# Patient Record
Sex: Female | Born: 1935 | Race: Black or African American | Hispanic: No | Smoking: Current some day smoker
Health system: Southern US, Community
[De-identification: ages and names within clinical notes are randomized; demographics above are authoritative.]

## PROBLEM LIST (undated history)

## (undated) DIAGNOSIS — M797 Fibromyalgia: Secondary | ICD-10-CM

## (undated) DIAGNOSIS — K219 Gastro-esophageal reflux disease without esophagitis: Secondary | ICD-10-CM

## (undated) DIAGNOSIS — M4712 Other spondylosis with myelopathy, cervical region: Secondary | ICD-10-CM

## (undated) DIAGNOSIS — R9439 Abnormal result of other cardiovascular function study: Secondary | ICD-10-CM

## (undated) DIAGNOSIS — J45909 Unspecified asthma, uncomplicated: Secondary | ICD-10-CM

## (undated) DIAGNOSIS — E7800 Pure hypercholesterolemia, unspecified: Secondary | ICD-10-CM

## (undated) DIAGNOSIS — E119 Type 2 diabetes mellitus without complications: Secondary | ICD-10-CM

## (undated) DIAGNOSIS — J42 Unspecified chronic bronchitis: Secondary | ICD-10-CM

## (undated) DIAGNOSIS — I10 Essential (primary) hypertension: Secondary | ICD-10-CM

## (undated) DIAGNOSIS — T8859XA Other complications of anesthesia, initial encounter: Secondary | ICD-10-CM

## (undated) DIAGNOSIS — T4145XA Adverse effect of unspecified anesthetic, initial encounter: Secondary | ICD-10-CM

## (undated) DIAGNOSIS — R51 Headache: Secondary | ICD-10-CM

## (undated) DIAGNOSIS — E785 Hyperlipidemia, unspecified: Secondary | ICD-10-CM

## (undated) DIAGNOSIS — Z9289 Personal history of other medical treatment: Secondary | ICD-10-CM

## (undated) DIAGNOSIS — I6529 Occlusion and stenosis of unspecified carotid artery: Secondary | ICD-10-CM

## (undated) DIAGNOSIS — K589 Irritable bowel syndrome without diarrhea: Secondary | ICD-10-CM

## (undated) DIAGNOSIS — G4730 Sleep apnea, unspecified: Secondary | ICD-10-CM

## (undated) DIAGNOSIS — M1990 Unspecified osteoarthritis, unspecified site: Secondary | ICD-10-CM

## (undated) DIAGNOSIS — E039 Hypothyroidism, unspecified: Secondary | ICD-10-CM

## (undated) DIAGNOSIS — N200 Calculus of kidney: Secondary | ICD-10-CM

## (undated) DIAGNOSIS — K649 Unspecified hemorrhoids: Secondary | ICD-10-CM

## (undated) DIAGNOSIS — E739 Lactose intolerance, unspecified: Secondary | ICD-10-CM

## (undated) HISTORY — PX: CARDIAC CATHETERIZATION: SHX172

## (undated) HISTORY — DX: Abnormal result of other cardiovascular function study: R94.39

## (undated) HISTORY — PX: BREAST BIOPSY: SHX20

## (undated) HISTORY — PX: ELBOW SURGERY: SHX618

## (undated) HISTORY — PX: CYSTOSCOPY W/ URETERAL STENT PLACEMENT: SHX1429

## (undated) HISTORY — DX: Occlusion and stenosis of unspecified carotid artery: I65.29

## (undated) HISTORY — DX: Hyperlipidemia, unspecified: E78.5

## (undated) HISTORY — PX: KNEE ARTHROSCOPY: SHX127

## (undated) HISTORY — PX: SHOULDER ARTHROSCOPY: SHX128

## (undated) HISTORY — PX: MASTECTOMY COMPLETE / SIMPLE: SUR845

## (undated) HISTORY — PX: CYSTOSCOPY W/ URETERAL STENT REMOVAL: SHX1430

## (undated) HISTORY — DX: Other spondylosis with myelopathy, cervical region: M47.12

## (undated) HISTORY — DX: Unspecified hemorrhoids: K64.9

## (undated) HISTORY — DX: Irritable bowel syndrome, unspecified: K58.9

## (undated) HISTORY — DX: Personal history of other medical treatment: Z92.89

## (undated) HISTORY — PX: ABDOMINAL HYSTERECTOMY: SHX81

## (undated) SURGERY — ARTHROPLASTY, KNEE, TOTAL
Anesthesia: General | Laterality: Left

---
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
## 1997-09-15 ENCOUNTER — Emergency Department (HOSPITAL_COMMUNITY): Admission: EM | Admit: 1997-09-15 | Discharge: 1997-09-15 | Payer: Self-pay | Admitting: Emergency Medicine

## 1998-04-14 ENCOUNTER — Encounter: Payer: Self-pay | Admitting: Urology

## 1998-04-14 ENCOUNTER — Ambulatory Visit (HOSPITAL_COMMUNITY): Admission: RE | Admit: 1998-04-14 | Discharge: 1998-04-14 | Payer: Self-pay | Admitting: Urology

## 1998-05-11 ENCOUNTER — Ambulatory Visit (HOSPITAL_COMMUNITY): Admission: RE | Admit: 1998-05-11 | Discharge: 1998-05-11 | Payer: Self-pay | Admitting: Internal Medicine

## 1998-05-12 ENCOUNTER — Encounter: Payer: Self-pay | Admitting: Internal Medicine

## 1998-06-19 ENCOUNTER — Emergency Department (HOSPITAL_COMMUNITY): Admission: EM | Admit: 1998-06-19 | Discharge: 1998-06-19 | Payer: Self-pay | Admitting: Emergency Medicine

## 1998-06-19 ENCOUNTER — Encounter: Payer: Self-pay | Admitting: Emergency Medicine

## 1998-11-24 ENCOUNTER — Encounter: Payer: Self-pay | Admitting: Emergency Medicine

## 1998-11-24 ENCOUNTER — Emergency Department (HOSPITAL_COMMUNITY): Admission: EM | Admit: 1998-11-24 | Discharge: 1998-11-24 | Payer: Self-pay | Admitting: Emergency Medicine

## 1998-12-16 ENCOUNTER — Emergency Department (HOSPITAL_COMMUNITY): Admission: EM | Admit: 1998-12-16 | Discharge: 1998-12-16 | Payer: Self-pay

## 1998-12-18 ENCOUNTER — Inpatient Hospital Stay (HOSPITAL_COMMUNITY): Admission: EM | Admit: 1998-12-18 | Discharge: 1998-12-21 | Payer: Self-pay | Admitting: Internal Medicine

## 1999-09-11 ENCOUNTER — Emergency Department (HOSPITAL_COMMUNITY): Admission: EM | Admit: 1999-09-11 | Discharge: 1999-09-11 | Payer: Self-pay | Admitting: Emergency Medicine

## 2000-02-07 ENCOUNTER — Emergency Department (HOSPITAL_COMMUNITY): Admission: EM | Admit: 2000-02-07 | Discharge: 2000-02-07 | Payer: Self-pay | Admitting: Emergency Medicine

## 2000-02-07 ENCOUNTER — Encounter: Payer: Self-pay | Admitting: Emergency Medicine

## 2000-02-14 ENCOUNTER — Emergency Department (HOSPITAL_COMMUNITY): Admission: EM | Admit: 2000-02-14 | Discharge: 2000-02-14 | Payer: Self-pay

## 2000-02-14 ENCOUNTER — Encounter: Payer: Self-pay | Admitting: Emergency Medicine

## 2000-04-29 ENCOUNTER — Encounter: Payer: Self-pay | Admitting: Orthopedic Surgery

## 2000-04-29 ENCOUNTER — Ambulatory Visit (HOSPITAL_COMMUNITY): Admission: RE | Admit: 2000-04-29 | Discharge: 2000-04-29 | Payer: Self-pay | Admitting: Orthopedic Surgery

## 2000-12-01 ENCOUNTER — Ambulatory Visit (HOSPITAL_COMMUNITY): Admission: RE | Admit: 2000-12-01 | Discharge: 2000-12-01 | Payer: Self-pay | Admitting: Cardiology

## 2000-12-01 ENCOUNTER — Encounter: Payer: Self-pay | Admitting: Cardiology

## 2000-12-11 ENCOUNTER — Ambulatory Visit (HOSPITAL_COMMUNITY): Admission: RE | Admit: 2000-12-11 | Discharge: 2000-12-11 | Payer: Self-pay | Admitting: Cardiology

## 2000-12-27 ENCOUNTER — Emergency Department (HOSPITAL_COMMUNITY): Admission: EM | Admit: 2000-12-27 | Discharge: 2000-12-28 | Payer: Self-pay | Admitting: Emergency Medicine

## 2000-12-28 ENCOUNTER — Encounter: Payer: Self-pay | Admitting: Emergency Medicine

## 2001-01-02 ENCOUNTER — Encounter: Payer: Self-pay | Admitting: Urology

## 2001-01-02 ENCOUNTER — Encounter: Admission: RE | Admit: 2001-01-02 | Discharge: 2001-01-02 | Payer: Self-pay | Admitting: Urology

## 2001-01-04 ENCOUNTER — Emergency Department (HOSPITAL_COMMUNITY): Admission: EM | Admit: 2001-01-04 | Discharge: 2001-01-04 | Payer: Self-pay | Admitting: Emergency Medicine

## 2001-01-07 ENCOUNTER — Encounter: Admission: RE | Admit: 2001-01-07 | Discharge: 2001-01-07 | Payer: Self-pay | Admitting: Urology

## 2001-01-07 ENCOUNTER — Encounter: Payer: Self-pay | Admitting: Urology

## 2001-08-03 ENCOUNTER — Encounter: Payer: Self-pay | Admitting: Cardiology

## 2001-08-03 ENCOUNTER — Inpatient Hospital Stay (HOSPITAL_COMMUNITY): Admission: EM | Admit: 2001-08-03 | Discharge: 2001-08-06 | Payer: Self-pay | Admitting: Emergency Medicine

## 2001-08-27 ENCOUNTER — Emergency Department (HOSPITAL_COMMUNITY): Admission: EM | Admit: 2001-08-27 | Discharge: 2001-08-27 | Payer: Self-pay | Admitting: Neurology

## 2001-08-27 ENCOUNTER — Encounter: Payer: Self-pay | Admitting: Emergency Medicine

## 2001-09-14 ENCOUNTER — Ambulatory Visit (HOSPITAL_COMMUNITY): Admission: RE | Admit: 2001-09-14 | Discharge: 2001-09-14 | Payer: Self-pay | Admitting: Orthopedic Surgery

## 2001-09-14 ENCOUNTER — Encounter: Payer: Self-pay | Admitting: Orthopedic Surgery

## 2001-11-03 ENCOUNTER — Encounter: Admission: RE | Admit: 2001-11-03 | Discharge: 2002-02-01 | Payer: Self-pay

## 2002-02-11 ENCOUNTER — Encounter: Admission: RE | Admit: 2002-02-11 | Discharge: 2002-03-02 | Payer: Self-pay

## 2002-03-11 ENCOUNTER — Encounter: Admission: RE | Admit: 2002-03-11 | Discharge: 2002-06-09 | Payer: Self-pay

## 2002-04-30 ENCOUNTER — Encounter: Admission: RE | Admit: 2002-04-30 | Discharge: 2002-04-30 | Payer: Self-pay | Admitting: Cardiology

## 2002-04-30 ENCOUNTER — Encounter: Payer: Self-pay | Admitting: Cardiology

## 2002-05-31 ENCOUNTER — Other Ambulatory Visit: Admission: RE | Admit: 2002-05-31 | Discharge: 2002-05-31 | Payer: Self-pay | Admitting: General Surgery

## 2002-07-07 ENCOUNTER — Ambulatory Visit (HOSPITAL_COMMUNITY): Admission: RE | Admit: 2002-07-07 | Discharge: 2002-07-07 | Payer: Self-pay | Admitting: General Surgery

## 2002-07-07 ENCOUNTER — Encounter (HOSPITAL_BASED_OUTPATIENT_CLINIC_OR_DEPARTMENT_OTHER): Payer: Self-pay | Admitting: General Surgery

## 2002-11-02 ENCOUNTER — Encounter: Payer: Self-pay | Admitting: Orthopedic Surgery

## 2002-11-02 ENCOUNTER — Ambulatory Visit (HOSPITAL_COMMUNITY): Admission: RE | Admit: 2002-11-02 | Discharge: 2002-11-02 | Payer: Self-pay | Admitting: Orthopedic Surgery

## 2002-12-07 ENCOUNTER — Encounter: Payer: Self-pay | Admitting: Orthopedic Surgery

## 2002-12-09 ENCOUNTER — Ambulatory Visit (HOSPITAL_COMMUNITY): Admission: RE | Admit: 2002-12-09 | Discharge: 2002-12-10 | Payer: Self-pay | Admitting: Orthopedic Surgery

## 2002-12-28 ENCOUNTER — Encounter: Admission: RE | Admit: 2002-12-28 | Discharge: 2003-03-28 | Payer: Self-pay | Admitting: Orthopedic Surgery

## 2003-04-12 ENCOUNTER — Encounter: Admission: RE | Admit: 2003-04-12 | Discharge: 2003-07-11 | Payer: Self-pay | Admitting: Orthopedic Surgery

## 2003-07-24 ENCOUNTER — Emergency Department (HOSPITAL_COMMUNITY): Admission: EM | Admit: 2003-07-24 | Discharge: 2003-07-25 | Payer: Self-pay

## 2004-08-29 ENCOUNTER — Encounter
Admission: RE | Admit: 2004-08-29 | Discharge: 2004-11-27 | Payer: Self-pay | Admitting: Physical Medicine & Rehabilitation

## 2004-09-05 ENCOUNTER — Ambulatory Visit (HOSPITAL_COMMUNITY): Admission: RE | Admit: 2004-09-05 | Discharge: 2004-09-05 | Payer: Self-pay | Admitting: Orthopedic Surgery

## 2005-01-02 ENCOUNTER — Encounter: Admission: RE | Admit: 2005-01-02 | Discharge: 2005-01-02 | Payer: Self-pay | Admitting: Neurosurgery

## 2005-01-23 ENCOUNTER — Encounter: Admission: RE | Admit: 2005-01-23 | Discharge: 2005-01-23 | Payer: Self-pay | Admitting: Neurosurgery

## 2005-07-27 ENCOUNTER — Emergency Department (HOSPITAL_COMMUNITY): Admission: EM | Admit: 2005-07-27 | Discharge: 2005-07-27 | Payer: Self-pay | Admitting: Emergency Medicine

## 2005-09-11 ENCOUNTER — Ambulatory Visit: Payer: Self-pay | Admitting: Internal Medicine

## 2006-02-25 ENCOUNTER — Encounter: Admission: RE | Admit: 2006-02-25 | Discharge: 2006-02-25 | Payer: Self-pay | Admitting: Orthopedic Surgery

## 2006-12-23 ENCOUNTER — Ambulatory Visit (HOSPITAL_COMMUNITY): Admission: RE | Admit: 2006-12-23 | Discharge: 2006-12-23 | Payer: Self-pay | Admitting: Orthopedic Surgery

## 2007-03-28 ENCOUNTER — Encounter: Admission: RE | Admit: 2007-03-28 | Discharge: 2007-03-28 | Payer: Self-pay | Admitting: Orthopedic Surgery

## 2007-08-06 ENCOUNTER — Encounter: Admission: RE | Admit: 2007-08-06 | Discharge: 2007-09-10 | Payer: Self-pay | Admitting: Orthopedic Surgery

## 2007-10-29 ENCOUNTER — Encounter: Admission: RE | Admit: 2007-10-29 | Discharge: 2007-10-29 | Payer: Self-pay | Admitting: Orthopedic Surgery

## 2007-12-22 ENCOUNTER — Encounter: Admission: RE | Admit: 2007-12-22 | Discharge: 2007-12-22 | Payer: Self-pay | Admitting: Orthopedic Surgery

## 2007-12-25 ENCOUNTER — Encounter: Admission: RE | Admit: 2007-12-25 | Discharge: 2007-12-25 | Payer: Self-pay | Admitting: Orthopedic Surgery

## 2008-03-23 ENCOUNTER — Encounter: Admission: RE | Admit: 2008-03-23 | Discharge: 2008-03-23 | Payer: Self-pay | Admitting: Orthopedic Surgery

## 2009-01-19 ENCOUNTER — Ambulatory Visit (HOSPITAL_COMMUNITY): Admission: RE | Admit: 2009-01-19 | Discharge: 2009-01-20 | Payer: Self-pay | Admitting: Orthopedic Surgery

## 2009-04-01 ENCOUNTER — Encounter: Admission: RE | Admit: 2009-04-01 | Discharge: 2009-04-01 | Payer: Self-pay | Admitting: Orthopedic Surgery

## 2009-04-04 ENCOUNTER — Encounter: Admission: RE | Admit: 2009-04-04 | Discharge: 2009-05-17 | Payer: Self-pay | Admitting: Orthopedic Surgery

## 2009-05-15 ENCOUNTER — Ambulatory Visit (HOSPITAL_COMMUNITY): Admission: RE | Admit: 2009-05-15 | Discharge: 2009-05-15 | Payer: Self-pay | Admitting: Orthopedic Surgery

## 2009-10-19 ENCOUNTER — Encounter: Admission: RE | Admit: 2009-10-19 | Discharge: 2009-10-19 | Payer: Self-pay | Admitting: Neurosurgery

## 2009-11-04 ENCOUNTER — Emergency Department (HOSPITAL_COMMUNITY): Admission: EM | Admit: 2009-11-04 | Discharge: 2009-11-04 | Payer: Self-pay | Admitting: Emergency Medicine

## 2010-01-17 ENCOUNTER — Encounter: Admission: RE | Admit: 2010-01-17 | Discharge: 2010-01-17 | Payer: Self-pay | Admitting: Orthopedic Surgery

## 2010-06-24 ENCOUNTER — Encounter: Payer: Self-pay | Admitting: Family Medicine

## 2010-08-20 LAB — BASIC METABOLIC PANEL
BUN: 23 mg/dL (ref 6–23)
Chloride: 104 mEq/L (ref 96–112)
Creatinine, Ser: 1.02 mg/dL (ref 0.4–1.2)
Glucose, Bld: 113 mg/dL — ABNORMAL HIGH (ref 70–99)
Potassium: 4 mEq/L (ref 3.5–5.1)

## 2010-08-20 LAB — CBC
HCT: 38 % (ref 36.0–46.0)
MCV: 96.2 fL (ref 78.0–100.0)
Platelets: 244 10*3/uL (ref 150–400)
RDW: 13 % (ref 11.5–15.5)
WBC: 6.9 10*3/uL (ref 4.0–10.5)

## 2010-08-20 LAB — URINE CULTURE: Colony Count: NO GROWTH

## 2010-08-20 LAB — URINALYSIS, ROUTINE W REFLEX MICROSCOPIC
Ketones, ur: NEGATIVE mg/dL
Nitrite: NEGATIVE
Protein, ur: NEGATIVE mg/dL
Urobilinogen, UA: 0.2 mg/dL (ref 0.0–1.0)

## 2010-08-20 LAB — DIFFERENTIAL
Basophils Absolute: 0.1 10*3/uL (ref 0.0–0.1)
Basophils Relative: 1 % (ref 0–1)
Eosinophils Absolute: 0.3 10*3/uL (ref 0.0–0.7)
Eosinophils Relative: 4 % (ref 0–5)
Neutrophils Relative %: 40 % — ABNORMAL LOW (ref 43–77)

## 2010-08-20 LAB — URINE MICROSCOPIC-ADD ON

## 2010-09-04 LAB — CBC
HCT: 37.7 % (ref 36.0–46.0)
MCHC: 33.7 g/dL (ref 30.0–36.0)
MCV: 95.1 fL (ref 78.0–100.0)
Platelets: 239 10*3/uL (ref 150–400)
RDW: 13.1 % (ref 11.5–15.5)

## 2010-09-04 LAB — URINALYSIS, ROUTINE W REFLEX MICROSCOPIC
Glucose, UA: NEGATIVE mg/dL
Hgb urine dipstick: NEGATIVE
Specific Gravity, Urine: 1.024 (ref 1.005–1.030)
Urobilinogen, UA: 0.2 mg/dL (ref 0.0–1.0)
pH: 5.5 (ref 5.0–8.0)

## 2010-09-04 LAB — COMPREHENSIVE METABOLIC PANEL
AST: 19 U/L (ref 0–37)
Albumin: 4 g/dL (ref 3.5–5.2)
BUN: 18 mg/dL (ref 6–23)
Calcium: 9.4 mg/dL (ref 8.4–10.5)
Creatinine, Ser: 0.98 mg/dL (ref 0.4–1.2)
GFR calc Af Amer: >60 mL/min (ref >60)
Total Bilirubin: 0.5 mg/dL (ref 0.3–1.2)
Total Protein: 6.7 g/dL (ref 6.0–8.3)

## 2010-09-04 LAB — GLUCOSE, CAPILLARY
Glucose-Capillary: 112 mg/dL — ABNORMAL HIGH (ref 70–99)
Glucose-Capillary: 120 mg/dL — ABNORMAL HIGH (ref 70–99)

## 2010-09-08 LAB — GLUCOSE, CAPILLARY
Glucose-Capillary: 108 mg/dL — ABNORMAL HIGH (ref 70–99)
Glucose-Capillary: 140 mg/dL — ABNORMAL HIGH (ref 70–99)

## 2010-09-08 LAB — COMPREHENSIVE METABOLIC PANEL
AST: 22 U/L (ref 0–37)
Albumin: 4.3 g/dL (ref 3.5–5.2)
Calcium: 9.9 mg/dL (ref 8.4–10.5)
Chloride: 102 mEq/L (ref 96–112)
Creatinine, Ser: 1.23 mg/dL — ABNORMAL HIGH (ref 0.4–1.2)
GFR calc Af Amer: 52 mL/min — ABNORMAL LOW (ref >60)
Sodium: 141 mEq/L (ref 135–145)
Total Bilirubin: 0.8 mg/dL (ref 0.3–1.2)

## 2010-09-08 LAB — URINALYSIS, ROUTINE W REFLEX MICROSCOPIC
Hgb urine dipstick: NEGATIVE
Specific Gravity, Urine: 1.03 (ref 1.005–1.030)
pH: 5 (ref 5.0–8.0)

## 2010-09-08 LAB — CBC
MCV: 95.1 fL (ref 78.0–100.0)
Platelets: 265 10*3/uL (ref 150–400)
WBC: 6.8 10*3/uL (ref 4.0–10.5)

## 2010-10-01 ENCOUNTER — Ambulatory Visit
Admission: RE | Admit: 2010-10-01 | Discharge: 2010-10-01 | Disposition: A | Discharge status: Home / Self Care | Payer: Medicare Other | Source: Ambulatory Visit | Attending: Orthopedic Surgery | Admitting: Orthopedic Surgery

## 2010-10-01 ENCOUNTER — Other Ambulatory Visit: Payer: Self-pay | Admitting: Orthopedic Surgery

## 2010-10-01 DIAGNOSIS — R52 Pain, unspecified: Secondary | ICD-10-CM

## 2010-10-16 NOTE — Op Note (Signed)
NAME:  Jean Aguilar, Jean Aguilar NO.:  [id number]   MEDICAL RECORD NO.:  [id number]          PATIENT TYPE:  OIB   LOCATION:  5039                         FACILITY:  [facility]   PHYSICIAN:  Myrtie Neither, MD      DATE OF BIRTH:  Jun 05, 1935   DATE OF PROCEDURE:  01/19/2009  DATE OF DISCHARGE:                               OPERATIVE REPORT   PREOPERATIVE DIAGNOSIS:  Ulnar nerve entrapment, left elbow.   PREOPERATIVE DIAGNOSIS:  Ulnar nerve entrapment, left elbow.   ANESTHESIA:  General.   PROCEDURE:  Transposition of ulnar nerve, left elbow.   The patient was taken to the operating room.  After given adequate preop  medications, given general anesthesia and intubated, left upper  extremity was prepped with DuraPrep and draped in a sterile manner.  Tourniquet and Bovie were used for hemostasis.  Curvilinear incision was  made along the posteromedial aspect of the left elbow going through skin  and subcutaneous tissue down to the fascia.  Ulnar nerve was identified  at its olecranon groove, entrapped body of the transverse bands of  fibrotic scarring tissue.  The ulnar nerve was released both distally  and proximally after adequate dissection and freeing up of the nerve.  The nerve was then transferred anteriorly within the subcutaneous and  fascial tissue.  Copious irrigation was done.  Nerve itself was found to  be intact and was found to be stenotic at its passage to the groove.  Wound closure was then done with 2-0 Vicryl for the subcutaneous and  skin staples for the skin.  Compressive dressing was applied and sling  was applied.  The patient tolerated the procedure quite well and went to  the recovery room in stable and satisfactory condition.  The patient is  being kept for 23-hour observation for pain control and will be  discharged home on Percocet 1-2 q.4 p.r.n. for pain, use of sling, ice  packs, and to return to the office in 1 week.  The patient will be  discharged  in stable and satisfactory condition.      Myrtie Neither, MD  Electronically Signed     AC/MEDQ  D:  01/19/2009  T:  01/19/2009  Job:  [id number]

## 2010-10-19 NOTE — Consult Note (Signed)
[facility]  Patient:    Jean Aguilar, Jean Aguilar Visit Number: [id number] MRN: [id number]          Service Type: PMG Location: [facility] Attending Physician:  Jean Aguilar Dictated by:   Jean Aguilar, D.O. Proc. Date: 11/20/01 Admit Date:  11/03/2001   CC:         Jean Aguilar, M.D.   Consultation Report  [name] returns to clinic today as scheduled for a lumbar epidural steroid injection for chronic low back pain with degenerative disk disease of the lumbar spine including L5-S1 disk herniation to the left with left lower extremity radicular symptoms.  The patient was initially seen on November 17, 2001.  In the interim she has had no change in her pain or neurologic status. Her pain today is a 10/10 on a subjective scale.  Function and quality of life indexes remain declined.  Sleep remains poor.  She continues on Celebrex and Lorcet as needed.  I review health and history form and 14 point review of systems.  PHYSICAL EXAMINATION  VITAL SIGNS:  Blood pressure 143/53, pulse 86, respirations 10, O2 saturation 97% on room air.  IMPRESSION:  Chronic low back pain with degenerative disk disease of the lumbar spine, L5-S1 disk herniation with left lower extremity radicular symptoms.  PLAN: 1. Lumbar epidural steroid injection. 2. Continue current medications. 3. The patient is to return to clinic in two weeks for reevaluation and    possible repeat lumbar epidural steroid injection as predicated upon    patients response and symptoms.  PROCEDURE:  Lumbar epidural steroid injection.  Risks, benefits, limitations, and alternatives were described.  Procedure was described in detail.  The patient wishes to proceed.  Informed consent was obtained.  The patient was brought back to the fluoroscopy suite and placed on the table in prone position.  Skin was prepped and draped in the usual sterile fashion.  Skin and subcutaneous tissues were anesthetized  with 3 cc of preservative-free 1% lidocaine.  Under direct fluoroscopic guidance an 18-gauge 3.5 inch Tuohy needle was advanced into the left paramedian L5-S1 epidural space with loss of resistance technique.  No CSF, heme, or paraesthesias were noted.  This was then injected with 1.5 cc of Kenalog 40 mg/cc plus 1.5 cc of preservative-free normal saline with needle flush.  There were no complications.  The patient tolerated the procedure well.  Discharge instructions given.  The patient was educated on the above findings and recommendations and understands.  No barriers to communication. Dictated by:   Jean Aguilar, D.O. Attending Physician:  Jean Aguilar DD:  11/20/01 TD:  11/22/01 Job: [id number]

## 2010-10-19 NOTE — Op Note (Signed)
   NAME:  Jean Aguilar, Jean Aguilar                      ACCOUNT NO.:  [id number]   MEDICAL RECORD NO.:  [id number]                   PATIENT TYPE:  OIB   LOCATION:  5012                                 FACILITY:  [facility]   PHYSICIAN:  Myrtie Neither, M.D.                 DATE OF BIRTH:  May 03, 1936   DATE OF PROCEDURE:  12/09/2002  DATE OF DISCHARGE:  12/10/2002                                 OPERATIVE REPORT   PREOPERATIVE DIAGNOSIS:  Impingement syndrome and subacromial bursitis right  shoulder.   POSTOPERATIVE DIAGNOSIS:  Impingement syndrome and subacromial bursitis  right shoulder.   ANESTHESIA:  General.   PROCEDURE:  Arthroscopic acromioplasty, synovectomy, and decompression right  shoulder.   DESCRIPTION OF PROCEDURE:  The patient was taken to the operating room after  given adequate preoperative medications and given general anesthesia and  intubated.  The patient was also given scalene block to the right shoulder.  The patient was placed in barber chair position, right shoulder prepped and  draped in sterile manner.  __________ puncture wound made posterior of the  shoulder.  A ___________ rod was placed from posterior to anterior.  Anterior inflow of water.  Entrance was then made.  Placement of an  arthroscope was posterior, inflow of water was anterior.  Inspection of the  joint revealed tremendously hypertrophic subacromial bursal thickening with  chondromalacia changes of the subacromial articular surface.  Rotator cuff  was intact.  With a synovial shaver placed laterally, complete synovectomy  was done, after which the scope was placed laterally and the shaver placed  posteriorly and further synovectomy was done and acromioplasty was then done  with the bur and decompression of the coracoacromial ligament was then done  and copious and abundant irrigation was done.  After adequate decompression,  wound closure was then done with 2-0 nylon, 10 mL of 0.25% Marcaine with  epinephrine was injected to the joint.  A compressive dressing was applied.  A shoulder immobilizer applied.  The patient tolerated the procedure well  and went to the recovery room in stable and satisfactory condition.  The  patient is to be kept 23 observation for pain control.  The patient will be  discharged in the a.m. on Percocet one q.4h. p.r.n. for pain.  Continue  shoulder immobilizer, ice packs, and return to the office in one week.  The  patient will be discharged in stable and satisfactory condition.                                               Myrtie Neither, M.D.    AC/MEDQ  D:  12/09/2002  T:  12/09/2002  Job:  [id number]

## 2010-10-19 NOTE — Consult Note (Signed)
[facility]  Patient:    FRANSHESCA, CHIPMAN Visit Number: [id number] MRN: [id number]          Service Type: PMG Location: [facility]PC Attending Physician:  Sondra Come Dictated by:   Sondra Come, D.O. Proc. Date: 12/09/01 Admit Date:  11/03/2001   CC:         Kennieth Rad, M.D.   Consultation Report  HISTORY OF PRESENT ILLNESS:  Ms. Runnion returns to clinic today as scheduled for reevaluation and possible repeat lumbar epidural steroid injection for degenerative disk disease of the lumbar spine with a small L5-S1 disk herniation and left lower extremity radicular symptoms.  The patient states that she had no relief with the first lumbar epidural steroid injection on November 20, 2001.  Mainly her complaints today involve her left hip pain.  She states her low back is somewhat tolerable.  She rates her pain as a "severe" which corresponds with a 6/10 on a subjective scale.  Her hip pain is worse with walking, bending, and laying down as well as working.  She continues to take Celebrex but has run out and will go for a refill.  She also has run out of her hydrocodone and we discussed medications.  I reviewed the health and history form, and 14-point review of systems.  PHYSICAL EXAMINATION:  GENERAL:  The patient is an obese female in no acute distress.  VITAL SIGNS:  Blood pressure 121/63, pulse 93, respirations ______, O2 saturation is 97% on room air.  NEUROLOGIC:  Manual muscle testing is 5/5 bilateral lower extremities. Sensory exam is intact to light touch bilateral lower extremities.  Muscle stretch reflexes are symmetric bilateral lower extremities.  MUSCULOSKELETAL:  Palpatory examination reveals significant tenderness over the left greater trochanter reproducing the patients hip pain.  IMPRESSION: 1. Left trochanteric bursitis. 2. Degenerative disk disease of the lumbar spine at L5-S1 with small disk    herniation.  PLAN: 1. I had  a long discussion with Ms. Alspaugh regarding further treatment    options.  At this point, I think further lumbar epidural steroid injections    would be optional as she did not get any significant benefit from the first    injection although occasionally some patients will not see any response    until after the second injection and this was explained to the patient.    However, given the patients complaints of significant left hip pain and    reproduction of her hip pain on palpation of the trochanteric bursa, would    forego a repeat lumbar epidural steroid injection at this time in favor of    a left trochanteric bursal steroid injection.  This was discussed with the    patient at length.  I discussed risks, benefits, limitations, and    alternatives of the trochanteric bursal steroid injection.  The patient    wishes to proceed with this. 2. Continue Celebrex 200 mg one p.o. q.d. to b.i.d.  I have provided the    patient with 30 sample pills. 3. Continue Elavil. 4. Will give samples of Ultracet one to two up to three to four times per day    as needed for severe pain, #20 samples given. 5. The patient is to return to clinic in two weeks for reevaluation.  Would    consider repeating epidural injection at that time if no significant    improvement in the patients back pain.  PROCEDURE:  Left trochanteric bursal steroid injection.  The risks, benefits, limitations, and alternatives were described.  The patient wishes to proceed. Informed consent was obtained.  DESCRIPTION OF PROCEDURE:  The patient was placed on the table in lateral recumbent position.  The skin was prepped in normal fashion with Betadine and alcohol.  The left trochanteric bursa was injected with 1 cc of Kenalog 40 mg/cc plus 2 cc of 1% Lidocaine plus 2 cc of 0.25% Bupivacaine using a 25-gauge 1-1/2-inch needle.  The patient tolerated the procedure well. Discharge instructions given.  The patient instructed on  post-injection relative rest x48-72 hours.  The patient also instructed on the use of ice as needed for rebound pain.  The patient was educated on the above findings and recommendations, and understands.  There were no barriers to communication. Dictated by:   Sondra Come, D.O. Attending Physician:  Sondra Come DD:  12/09/01 TD:  12/13/01 Job: [id number]

## 2010-10-19 NOTE — Discharge Summary (Signed)
[facility]  Patient:    Jean Aguilar, Jean Aguilar Visit Number: [id number] MRN: [id number]          Service Type: EMS Location: ED Attending Physician:  Ilene Qua Dictated by:   Osvaldo Shipper. Spruill, M.D. Admit Date:  08/27/2001 Discharge Date: 08/27/2001                             Discharge Summary  ADMISSION DIAGNOSES: 1. Severe left lower quadrant abdominal pain. 2. History of fibromyalgia. 3. Possible diverticular disease. 4. Anxiety. 5. Hypertension. 6. Obesity. 7. Asthma. 8. Hypothyroidism. 9. Irritable bowel syndrome.  DISCHARGE DIAGNOSES: 1. Lumbar disk displacement. 2. Myalgia and myositis. 3. Obesity. 4. Irritable bowel syndrome. 5. Hypertension. 6. History of asthma.  HISTORY OF PRESENT ILLNESS AND REASON FOR ADMISSION:  This 75 year old black female with a history of previous back problems as well as fibromyalgia presented to the hospital complaining of severe left lower quadrant abdominal pain.  The patient has a previous history also of nephrolithiasis and irritable bowel syndrome, as well as hyperlipidemia.  She presented to the emergency room complaining of severe left lower quadrant pain and left costovertebral angle pain on the left prior to her presentation.  The patient states she was well before this acute episode.  She gave a past history of three bouts of kidney stones which required admission as well.  She denied any concurrent nausea, no vomiting, since her admission to the hospital, but did complain of nausea prior to her presentation.  She also complains of leaky urine and problems with her urination stopping and starting.  The emergency physician evaluated the patient and revealed mild hypokalemia, no leukocytosis, and a clear urine.  The patient was subsequently admitted for further evaluation due to her complaints of severe and intractable left lower quadrant and CVA discomfort.  It was initially  suspected that she may of had another kidney stone.  LABORATORY DATA:  White blood cell count on 08/02/01, 6900, hemoglobin 14.7. Serum sodium 139, potassium 3.3, chloride 107, CO2 content 26, glucose 106, creatinine 0.8.  Lipase was 15.  Liver profile revealed a cholesterol of 229, LDL cholesterol was 151.  Urinalysis was yellow-clear, and unremarkable.  The patient had 45,000 colonies of E. coli in her urine.  The patient had a lumbosacral spine MRI which revealed a borderline foraminal stenosis and a small left paracentral disk herniation.  Additional radiologic studies revealed degenerative joint changes in the left hip.  Abdominal x-ray revealed no acute abnormality which was obtained on 08/03/01.  CT scan of the abdomen and pelvis without contrast revealed a small periumbilical hernia containing only fat, no acute abnormality on the CT scan.  The patient was status post hysterectomy, but a CT scan of the pelvis was performed.  HOSPITAL COURSE:  The patient was initially admitted to an observation bed for evaluation of her severe back and left lower quadrant abdominal pain.  She was started on intravenous fluids of normal saline at 100 cc an hour, and also started on a full liquid diet.  She subsequently had to be given parenteral analgesics including Demerol, as well as some p.o. Darvocet to obtain any relief of her discomforts.  Her urine was strained for possible kidney stones, as this sounded to be the most likely etiology of her discomfort.  Her potassium was replaced by mouth. Given K-Dur 20 mEq p.o. b.i.d.  An urinalysis was obtained as well.  The  urine culture revealed some E. coli, but not enough for significant infection.  She was placed on Zelnorm, Celebrex, Synthroid, Prempro, Lipitor, Diovan, and Combivent on initial admission.  The patients Demerol and Phenergan had to be increase as she continued to have severe abdominal pain.  A consultation was sent to Dr. Su Grand  who evaluated the patient and did not feel as if the patient had renal stones. Another consultation was sent to gastroenterologist who evaluated the patient and did not feel she had acute GI abnormality.  Eventually, the patients pain came under some control, and it was felt that she could be released home with further evaluation as an outpatient by Dr. Francene Boyers.  The patient was released, and maintained on her current medications, but it did include a prescription for a narcotic analgesic at discharge.  CONDITION ON DISCHARGE:  Improved.  She was maintaining for pain control on Duragesic patch 75 mg every 3 days.  DISCHARGE MEDICATIONS: 1. Duragesic patch 75 mg every 3 days. 2. Flexeril prior to being released.  FOLLOWUP:  The patient appears stable, and will be seen by Dr. Myrtie Neither as an outpatient.Dictated by:   Osvaldo Shipper. Spruill, M.D. Attending Physician:  Ilene Qua DD:  09/09/01 TD:  09/10/01 Job: [id number]

## 2010-10-19 NOTE — Consult Note (Signed)
NAME:  Jean Aguilar, Jean Aguilar                        ACCOUNT NO.:  [id number]   MEDICAL RECORD NO.:  [id number]                   PATIENT TYPE:   LOCATION:                                       FACILITY:   PHYSICIAN:  Sondra Come, D.O.                 DATE OF BIRTH:   DATE OF CONSULTATION:  01/04/2002  DATE OF DISCHARGE:                  PHYSICAL MEDICINE & REHABILITATION CONSULTATION   HISTORY OF PRESENT ILLNESS:  The patient returns to clinic today for a  lumbar epidural steroid injection for degenerative disk disease of the  lumbar spine with small disk herniation at L5-S1 and disk bulges at L3-4 and  L4-5.  The patient continues to have low back pain with left lower extremity  radicular symptoms.  She has had one lumbar epidural steroid injection which  she states gave her some transient relief.  She wishes to have another.  I  reviewed health and history form and 14-point review of systems.  Pain today  is 8/10 on a subjective scale.   PHYSICAL EXAMINATION:  GENERAL:  Obese female in no acute distress.  VITAL SIGNS:  Blood pressure 158/87, pulse 84, respirations 14, O2  saturation 96% on room air.   IMPRESSION:  Degenerative disk disease of the lumbar spine with L5-S1 disk  herniation and left lower extremity radicular symptoms.   PLAN:  1. Repeat lumbar epidural steroid injection.  2. Continue current medications including Norco 7.5 mg/325 mg 1 p.o. b.i.d.     to t.i.d. #30 without refills.  3. Trial of Zonegran 100 mg 1 p.o. q.d. #14-day sample pack given.  4. Patient to return to clinic in two weeks for reevaluation and possible     repeat injection as predicated upon the patient's response and symptoms.   PROCEDURE:  Lumbar epidural steroid injection.  Informed consent was  obtained.  The patient was brought back to the fluoroscopy suite and placed  on the table in the prone position.  The skin was prepped and draped in the  usual sterile fashion.  The skin and  subcutaneous tissues were anesthetized  with 3 cc of preservative free 1% lidocaine.  Under direct fluoroscopic  guidance an 18-gauge, 3-1/2-inch Touhy needle was advanced into the left  paramedial L5-S1 epidural space with loss of resistance technique.  No CSF  hemiparesthesias were noted.  This was then injected with 1.5 cc of Kenalog  40 mg/cc plus 1.5 cc of normal saline with needle flush.  There were no  complications.  The patient tolerated the procedure well.  Discharge  instructions were given.  Postinjection pain score is 0/10.   The patient was educated on the above findings and recommendations and  understands.  There were no barriers to communication.  Sondra Come, D.O.    JJW/MEDQ  D:  01/04/2002  T:  01/08/2002  Job:  [id number]

## 2010-10-19 NOTE — Consult Note (Signed)
NAME:  Jean Aguilar, Jean Aguilar                      ACCOUNT NO.:  [id number]   MEDICAL RECORD NO.:  [id number]                   PATIENT TYPE:  REC   LOCATION:  TPC                                  FACILITY:  [facility]   PHYSICIAN:  Sondra Come, D.O.                 DATE OF BIRTH:  09/24/1935   DATE OF CONSULTATION:  12/25/2001  DATE OF DISCHARGE:                  PHYSICAL MEDICINE & REHABILITATION CONSULTATION   REASON FOR CONSULTATION:  The patient returns to clinic today as scheduled  for reevaluation.  She was last seen on December 09, 2001.  She states that she  go no relief whatsoever with the left trochanteric bursal steroid injection.  We discussed possibly repeating a epidural steroid injection which did give  her some relief of her low back and lower extremity pain.  She describes her  pain today as severe rating in a 6/10 on a subjective scale.  I reviewed  Health and History form of 14-point review of systems.  She has not started  aquatic therapy yet and we discussed this.  She also has notice a new mass  in her right anterior chest wall and is status post mastectomy.  She states  that she will follow up with her primary physician in this regard.  She  wishes to proceed with a second lumbar epidural steroid injection for  degenerative disk disease of the lumbar spine at L5-S1 with a small disk  herniation.   PHYSICAL EXAMINATION:  GENERAL:  An obese female in no acute distress.   VITAL SIGNS:  Blood pressure 153/72, pulse 92, respirations 14.  O2  saturation 99% on room air.   MUSCULOSKELETAL:  Manual muscle testing is 5/5 bilateral lower extremities.  Sensory exam is intact to light touch bilateral lower extremities.  Muscle  strength reflexes are symmetrical bilateral lower extremities.   IMPRESSION:  1. Degenerative disk disease of the lumbar spine at L5-S1 with small disk     herniation.  2. Left trochanteric bursitis refractory to local steroid injection.   PLAN:  1.  Had a long discussion with the patient regarding treatment options.  At     this point I think it is reasonable to proceed with a second lumbar     epidural steroid injection and I discussed this with her.  She wishes to     proceed.  Unfortunately, part way through the procedure the C-arm     fluoroscopy malfunctioned and the epidural was stopped and the needle     removed intact prior to completion.  Will schedule her to come back next     week to repeat the lumbar epidural steroid injection.  2. Will provide prescription for Norco 7.5 mg/325 mg one p.o. t.i.d. as     needed dispense #30 without refills.  3. Continue Celebrex.  4. Continue Elavil for sleep.  5. The patient to return to clinic next week.   PROCEDURE:  Lumbar epidural steroid injection.  Risks,  benefits,  limitations, and alternatives were described and informed consent was  obtained.  The patient was brought back to the fluoroscopy suite and placed  on the table in a prone position.  Skin was prepped and draped in the usual  sterile fashion.  The skin and subcutaneous tissues were anesthetized with 4  cc of preservative-free 1% lidocaine.  Under direct fluoroscopic guidance,  an 18 gauge 3-1/2 inch 2 needle was advanced towards the left paramedian L5-  S1 epidural space.  Prior to reaching the epidural space, the C-arm  fluoroscopy malfunctioned.  The procedure was abandoned and the 2 needle was  withdrawn intact. The patient was released in a stable condition.   The patient was educated on above findings and recommendations and  understands.  There were no variation of indication.                                               Sondra Come, D.O.    JJW/MEDQ  D:  12/25/2001  T:  01/01/2002  Job:  [id number]   cc:   Kennieth Rad, M.D.

## 2010-10-19 NOTE — Cardiovascular Report (Signed)
[place]  Patient:    ZAIDE, MCCLENAHAN                   MRN: [id number] Adm. Date:  [id number] Attending:  Rinaldo Cloud N                        Cardiac Catheterization  PROCEDURE:  Left cardiac catheterization with selective left and right coronary angiography, left ventriculography via right groin using Judkins technique. DD:  12/11/00 TD:  12/11/00 Job: [id number]

## 2010-10-19 NOTE — Consult Note (Signed)
NAME:  Jean Aguilar, VANLIEW                      ACCOUNT NO.:  [id number]   MEDICAL RECORD NO.:  [id number]                   PATIENT TYPE:  REC   LOCATION:  TPC                                  FACILITY:  [facility]   PHYSICIAN:  Sondra Come, D.O.                 DATE OF BIRTH:  November 11, 1935   DATE OF CONSULTATION:  04/09/2002  DATE OF DISCHARGE:                                   CONSULTATION   HISTORY OF PRESENT ILLNESS:  Ms. Jean Aguilar returns to clinic today as  scheduled for reevaluation and possible sacroiliac joint injection. The  patient continues to complain of pain in her left buttock, pointing to her  left sacroiliac joint region. She has not gone to physical therapy yet  secondary to having trouble with her car. She continues on Celebrex and  hydrocodone for pain. Her pain today is a 9/10 on a subjective scale. She  does have some radicular component into her hip and into her posterior  thigh. Symptoms seemed to be worse with supine positive, sitting, and  climbing up and down stairs. I reviewed the health and history form and 14-  point review of systems   PHYSICAL EXAMINATION:  Reveals an obese female in no acute distress. Blood  pressure is 156/82, pulse 85, respirations 18, O2 saturations 97% on room  air. There is significant tenderness to palpation over the left sacroiliac  joint region. Range of motion of the lumbar spine is guarded. Manual muscle  testing is 5/5 bilateral lower extremities. Sensory examination intact to  light touch bilateral lower extremities. Muscle stretch reflexes are  symmetric bilateral lower extremities. Straight leg raise is negative  bilaterally. FABER test is positive on the left, negative on the right.   IMPRESSION:  1. Left sacroiliac joint pain.  2. Degenerative disk disease of the lumbar spine with previous small disk     herniation at L5-S1. I suspect most of patient's current symptoms are     secondary to her sacroiliac joint,  however.  3. History of fibromyalgia syndrome.  4. Osteoarthritis, right knee.   PLAN:  1. Left sacroiliac joint injection.  2. Continue Celebrex 200 mg one p.o. q.d.  3. Continue Lortab 7.5 mg one p.o. b.i.d. to t.i.d. as needed, #30, without     refills. We have discussed ultimately weaning her from this medication,     and she understands. I anticipate being able to do following the     sacroiliac joint injection if effective.  4. The patient to return to clinic in two weeks for reevaluation. Would     consider repeating injection but I am mindful of further steroid     exposure. She has undergone three epidural steroid injections with     relatively low-dose steroid as well as a trochanteric bursal steroid     injection some time ago with low-dose steroid.   PROCEDURE:  Left sacroiliac joint  injection. The procedure was described to  patient in detail including risks, benefits, limitations, and alternatives.  The patient wishes to proceed. Informed consent was obtained. The patient  was brought back to the fluoroscopy suite and placed and on the table in  prone position. Skin was prepped and draped in usual sterile fashion. Skin  and subcutaneous tissue were anesthetized with 3 cc of 1% lidocaine. Under  direct fluoroscopic guidance, a 22-gauge 5-inch spinal needle was advanced  into the inferior aspect of the left sacroiliac joint under multiple  fluoroscopic views. Needle tip placement was confirmed after negative  aspiration with the injection of 0.25 cc of Omnipaque 180, revealing a  sacroiliac joint arthrogram. This was then followed by an injection of 0.5  cc of Kenalog 40 mg/cc plus 1 cc of 1% lidocaine. The patient tolerated the  procedure well. Discharge instructions given. The patient was monitored and  released in stable condition. Post injection pain score is 2/10 on a  subjective scale.   The patient was educated about findings and recommendations and understands.   There were no barriers to communication.                                               Sondra Come, D.O.    JJW/MEDQ  D:  04/09/2002  T:  04/11/2002  Job:  [id number]

## 2010-10-19 NOTE — H&P (Signed)
NAME:  Jean Aguilar, Jean Aguilar                      ACCOUNT NO.:  [id number]   MEDICAL RECORD NO.:  [id number]                   PATIENT TYPE:  OIB   LOCATION:  5012                                 FACILITY:  [facility]   PHYSICIAN:  Myrtie Neither, M.D.                 DATE OF BIRTH:  1936/03/18   DATE OF ADMISSION:  12/09/2002  DATE OF DISCHARGE:                                HISTORY & PHYSICAL   CHIEF COMPLAINT:  Pain in the right shoulder.   HISTORY OF PRESENT ILLNESS:  This is a 75 year old female who had been  followed in the office over the past year for a subacromial bursitis and  impingement syndrome of the right shoulder.  The patient had been treated  with anti-inflammatories, therapeutic injections, warm compresses, shoulder  bracing.  The patient had an MRI which demonstrated impingement at the  rotator cuff but no through-and-through tear.   PAST MEDICAL HISTORY:  1. Appendectomy.  2. Hysterectomy.  3. Myomectomy.  4. Right knee arthroscopy.  5. Cardiac catheterization in 2002.  6. Laparoscopy for ovarian cysts.  7. The patient has history of hypercholesterolemia.  8. Fibromyalgia.  9. High blood pressure.  10.      Chronic bronchitis.  11.      Kidney stones.   SOCIAL HISTORY:  The patient lives with her husband.  The patient most  recently stopped smoking three months ago.  No history of alcohol use.   REVIEW OF SYSTEMS:  She has episodes of chronic coughing, chronic  bronchitis, some lower back symptoms, no urinary or bowel.  No cardiac  symptoms.   ALLERGIES:  1. PENICILLIN.  2. PPD.  3. KEFLEX.   MEDICATIONS:  1. Celebrex.  2. Prednisone.  3. Synthroid.  4. Diovan.  5. Lipitor.  6. Prempro.   PHYSICAL EXAMINATION:  VITAL SIGNS:  Temperature 99.2, pulse 99,  respirations 18, blood pressure 188/96, height 5 feet 5-1/2 inches, weight  218.  GENERAL:  Alert and oriented in no acute distress.  HEENT:  Normocephalic.  Eyes:  Conjunctivae are clear.  NECK:  Supple.  CHEST:  Clear.  CARDIAC:  S1, S2 regular.  EXTREMITIES:  Right shoulder tender anterior and laterally with pain on  abduction above 80 degrees, subacromial crepitance, increased pain and  weakness on resistant abduction and external rotation.  Good grip and pinch  and intrinsics intact.   LABORATORY DATA:  MRI demonstrated impingement of the right rotator cuff but  no tear.   IMPRESSION:  Impingement syndrome right shoulder.   PLAN:  Arthroscopic acromioplasty and decompression.                                               Myrtie Neither, M.D.    AC/MEDQ  D:  12/09/2002  T:  12/09/2002  Job:  [id number]

## 2010-10-19 NOTE — Consult Note (Signed)
[facility]  Patient:    Jean Aguilar, Jean Aguilar Visit Number: [id number] MRN: [id number]          Service Type: MED Location: [phone] Attending Physician:  Sandi Raveling Dictated by:   Lindaann Slough, M.D. Proc. Date: 08/03/01 Admit Date:  08/02/2001   CC:         Osvaldo Shipper. Spruill, M.D.   Consultation Report  REASON FOR CONSULTATION:  Left flank pain.  CONSULTATION REQUESTED BY:  Dr. Osvaldo Shipper. Spruill.  HISTORY OF PRESENT ILLNESS:  Patient is a 75 year old female who was admitted with sudden onset of severe left lower quadrant pain radiating to the left flank.  The pain has been associated with nausea but no vomiting.  She has a past history of kidney stone, irritable bowel syndrome, fibromyalgia, hypertension, asthma, hypercholesterolemia.  A CT scan of the abdomen and pelvis on August 03, 2001 showed no renal or ureteral stone.  Urinalysis showed no rbcs or wbcs.  PHYSICAL EXAMINATION:  ABDOMEN:  On physical examination, she has left CVA tenderness.  Her kidneys are not palpable.  She has tenderness in the left lower quadrant.  LABORATORY AND ACCESSORY DATA:  BUN is 15, creatinine 0.8, calcium 8.9, potassium 3.3, hemoglobin 14.7, hematocrit 42.5 and WBC 6.9.  IMPRESSION:  Question herniated disk.  I doubt that her symptoms are related to the urinary tract.  SUGGEST:  Consider MRI, lumbosacral spine.  I will follow the patient with you. Dictated by:   Lindaann Slough, M.D. Attending Physician:  Sandi Raveling DD:  08/03/01 TD:  08/04/01 Job: [id number]

## 2010-10-19 NOTE — Consult Note (Signed)
NAME:  Jean Aguilar, Jean Aguilar                        ACCOUNT NO.:  [id number]   MEDICAL RECORD NO.:  [id number]                    PATIENT TYPE:   LOCATION:                                       FACILITY:   PHYSICIAN:  Sondra Come, D.O.                 DATE OF BIRTH:   DATE OF CONSULTATION:  DATE OF DISCHARGE:                  PHYSICAL MEDICINE & REHABILITATION CONSULTATION   HISTORY OF PRESENT ILLNESS:  The patient returns to clinic today as  scheduled for reevaluation. She has undergone two lumbar epidural steroid  injections for degenerative disc disease of the lumbar spine with L5-S1 disc  herniation and left lower extremity radicular symptoms.  The patient states  that she has had some relief following the last injection on January 04, 2002.  Mainly her relief was in her low back. She continues to have lower extremity  radicular symptoms in an L5 distribution.  She also complains of wide spread  pain secondary to her fibromyalgia and right knee pain for which she has  undergone surgery in the past.  She has follow-up with her orthopedist,  Kennieth Rad, M.D., tomorrow in regard to her right knee pain.  I  reviewed the health and history form and 14-point review of systems.  The  patient's pain today is a 10/10 on a subjective scale.  Her function and  quality of life indices have improved.  Her sleep is good with Elavil.  She  has continued to take Norco 7.5 mg two to three times per day since I last  saw her secondary to pain.  We discussed this at length.  We discussed  further interventional procedures to help with pain control.   PHYSICAL EXAMINATION:  GENERAL APPEARANCE:  A healthy female in no acute  distress.  VITAL SIGNS:  Blood pressure is 151/63, pulse 92, respiratory rate 18, O2  saturations 98% on room air.  NEUROLOGIC:  Manual muscle testing is 5/5 bilateral lower extremities.  Sensory examination is intact to light touch bilateral lower extremities.  Muscle  stretch reflexes are symmetric bilateral lower extremities.   IMPRESSION:  1. Degenerative disc disease of lumbar spine at L5-S1 with small disc     herniation.  2. Persistent left L5 distribution radiculopathy/radiculitis.  3. History of fibromyalgia syndrome.  4. Right knee pain likely secondary to osteoarthritis.   PLAN:  1. I had a long discussion with the patient regarding further treatment     options.  I feel it is reasonable to proceed with a left L5-S1     transforaminal epidural steroid injection given the patient's significant     radicular pain complaint which has not been relieved completely with two     lumbar epidural steroid injections.  We discussed this and the patient     wishes to proceed.  2. Continue Norco 7.5 mg/325 mg one p.o. b.i.d. as needed, #30 without     refills.  3. Await orthopedic reevaluation for  right knee pain.  4. Await aquatic therapy which should help the patient's diffuse     fibromyalgia type pain complaints.  5. The patient to return to clinic in one month for reevaluation or sooner     if needed.   PROCEDURE:  Left L5-S1 transforaminal epidural steroid injection.  The  procedure was described to the patient in detail including risks, benefits,  limitations and alternatives.  The patient wishes to proceed.  Informed  consent was obtained. The patient was brought back to the fluoroscopy suite  and placed on the table in the prone position.  Skin was prepped and draped  in the usual sterile fashion.  Skin and subcutaneous tissues were  anesthetized with 3 cc of 1% lidocaine.  Under direct fluoroscopic guidance,  a 22-gauge five inch needle was advanced into the left L5-S1 neuroforamen  under oblique and AP views.  Needle tip placement was confirmed under  oblique, AP and lateral views to be just lateral to the six o'clock position  on the L5 pedicle in the AP view.  Further confirmation was obtained after  negative aspiration with the  injection of 1 cc of Omnipaque 180 revealing a  left L5 epiradiculogram. There was no vascular uptake noted.  This was then  injected with 1 cc of Kenalog 40 mg/cc plus 2 cc of preservative-free 1%  lidocaine with needle flush.  No complications.  The patient tolerated the  procedure well.  Discharge instructions given.  The patient's post injection  pain score is 0/10 in regard to her lower extremity radicular pain.   The patient was educated of the above findings and recommendations and  understands.  No barriers to communication.                                                Sondra Come, D.O.    JJW/MEDQ  D:  01/18/2002  T:  01/18/2002  Job:  [id number]   cc:   Kennieth Rad, M.D.

## 2010-10-19 NOTE — Cardiovascular Report (Signed)
[facility]  Patient:    Jean Aguilar, Jean Aguilar                   MRN: [id number] Proc. Date: 12/11/00 Adm. Date:  [id number] Attending:  Robynn Pane CC:         Cardiac Catheterization Lab  Osvaldo Shipper. Spruill, M.D.   Cardiac Catheterization  PROCEDURE: 1. Left cardiac catheterization. 2. Selective left and right coronary angiography. 3. Left ventriculography via right groin using Judkins technique.  CARDIOLOGISTEduardo Osier Sharyn Lull, M.D.  INDICATIONS FOR PROCEDURE:  Jean Aguilar is a 75 year old black female with past medical history significant for hypertension, hypercholesterolemia, fibromyalgia, hypothyroidism, asthmatic bronchitis, complaints of retrosternal chest pain and right arm numbness associated with palpitations and diaphoresis off an on, relieved with sublingual nitroglycerin and rest.  The patient also gives a history of exertional dyspnea.  She denies any nausea or vomiting, denies lightheadedness or syncope.  Denies PND, orthopnea, or leg swelling. The patient underwent adenosine Cardiolite on December 01, 2000, which showed focal inferoapical wall ischemia with ejection fraction of 67%.  Due to recurrent chest pain, multiple risk factors, and mildly causative adenosine Cardiolite, the patient was advised for left catheterization and possible percutaneous coronary intervention.  PAST MEDICAL HISTORY:  As above.  PAST SURGICAL HISTORY:  Appendectomy many years ago, hysterectomy 20+ years ago, status post mastectomy for fibrocystic disease many years ago, lumpectomy for fibrocystic disease many years ago, status post exploratory laparotomy for ovarian cyst resection.  She also has history of kidney stones.  HOME MEDICATIONS: 1. Celebrex 200 mg p.o. q.d. 2. Ultracet 1 p.o. b.i.d. 3. Synthroid 0.1 mg p.o. q.d. 4. Atenolol 50 mg p.o. q.d. 5. Lipitor 10 mg p.o. q.d. 6. Enteric-coated aspirin 325 mg p.o. q.d. 7. Nitrostat  sublingual p.r.n.  ALLERGIES:  PENICILLIN and KEFLEX.  SOCIAL HISTORY:  She is married and has three children.  Smoked one pack per day for three years, quit two months ago.  No history of alcohol abuse. Retired.  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  FAMILY HISTORY:  Father is alive.  He is 87.  Mother is alive.  She is 81. She has angina pectoris and Alzheimers disease.  One brother had Lyme disease.  PHYSICAL EXAMINATION:  GENERAL:  Alert, awake, oriented x 3, in no acute distress.  VITAL SIGNS:  Blood pressure 130/80, pulse 70 and regular.  HEENT:  Conjunctivae pink.  NECK:  Supple.  No JVD, no bruit.  LUNGS:  Clear to auscultation without rhonchi or rales.  There was no wheezing.  CARDIOVASCULAR:  S1 and S2 were normal.  There was no S3 gallop.  ABDOMEN:  Soft.  Bowel sounds were present.  Nontender.  EXTREMITIES:  There was no clubbing, cyanosis, or edema.  IMPRESSION: 1. New onset angina, positive adenosine Cardiolite, palpitations, rule out    cardiac arrhythmia. 2. Hypertension. 3. Hypercholesterolemia. 4. Morbid obesity. 5. Fibromyalgia. 6. History of bronchial asthma.  PLAN:  Discussed at length with patient the various options of treatment, its risk and benefits; i.e., death, MI, stroke, need for emergency CABG, risk of restenosis, local vascular complications, etc., and patient consented for the procedure.  PROCEDURE:  After obtaining informed consent, the patient was brought to the catheterization lab and was placed on fluoroscopy table.  The right groin was prepped and draped in usual fashion.  Xylocaine 2% was used for local anesthesia in the right groin.  With the help of a thin-walled  needle, a 6-French arterial sheath was placed.  The sheath was aspirated and flushed. Next, a 6-French left Judkins catheter was advanced over the wire under fluoroscopic guidance up to the ascending aorta.  The wire was pulled out. The catheter was aspirated and  connected to the manifold.  The catheter was further advanced and engaged into the left coronary ostium.  Multiple views of the left system were taken.  Next, the catheter was disengaged and was pulled out over the wire and was replaced with 6-French right Judkins catheter which was advanced over the wire under fluoroscopic guidance up to the ascending aorta.  The wire was pulled out.  The catheter was aspirated and connected to the manifold.  The catheter was further advanced and engaged into the right coronary ostium.  Multiple views of the right system were taken.  Next the catheter was disengaged and was pulled out over the wire and was replaced with a 6-French pigtail catheter which was advanced over the wire under fluoroscopic guidance up to the ascending aorta.  The wire was pulled out, the catheter was aspirated and connected to the manifold.   The catheter was further advanced across the aortic valve into the LV.  LV pressures were recorded.  Next, the left ventriculography was done in the 30-degree RAO position.  Post-angiographic pressures are recorded from LV, and then pullback pressures were recorded from the aorta.  There was no gradient across the aortic valve.  Next, the pigtail catheter was pulled out.  The sheathes were aspirated and flushed.  ANGIOGRAPHY FINDINGS:  Left ventriculography showed good LV systolic function.  There was 2+ catheter-induced MR.  The left main was short which was patent.  LAD has 10 to 15% mid stenosis.  Diagonal-1 and diagonal-2 were very, very small which were patent.  Ramus was patent.  Left circumflex was large, has 15 to 20% mid stenosis.  OM-1, OM-2, and OM-3 were patent.  Ostium was patent but was diffusely diseased distally.  The patient has codominant coronary system.  Arteriotomy was closed by Perclose without complications.  The patient tolerated the procedure well.  Plan is medical treatment.  The patient was transferred to  the recovery room in stable condition and will be discharged home this afternoon. DD:  12/11/00 TD:  12/11/00 Job: [id number]

## 2010-10-19 NOTE — Consult Note (Signed)
[facility]  Patient:    Jean Aguilar, Jean Aguilar Visit Number: [id number] MRN: [id number]          Service Type: PMG Location: [facility] Attending Physician:  Sondra Come Dictated by:   Sondra Come, D.O. Proc. Date: 11/17/01 Admit Date:  11/03/2001   CC:         Kennieth Rad, M.D.   Consultation Report  Dear Dr. Montez Morita:  Thank you very much for kindly referring Ms. Jean Aguilar to [facility].  Jean Aguilar was evaluated in our clinic today.  Please refer to the following for details regarding the history, physical examination, and treatment plan.  Once again, thank you for allowing [place] to participate in the care of Jean Aguilar.  CHIEF COMPLAINT:  Entire body joint and muscle pain.  HISTORY OF PRESENT ILLNESS:  Jean Aguilar is a pleasant 75 year old right hand dominant female with multiple pain complaints today.  Her main pain complaint involves her low back with pain radiating into her bilateral buttocks, left hip, and left lateral thigh and leg.  She has had an MRI of her lumbar spine in August 2002 revealing a leftward L5-S1 disk herniation with disk protrusions at L4-5 and L3-4 as well.  She also complains of left hip pain and right knee pain.  She states she has had surgery on her right knee in the past.  She has also undergone some injections into her right knee with some improvement in the past.  She believes she has arthritis involving essentially her entire body.  She was diagnosed with fibromyalgia syndrome several years back.  She has not had any formal physical therapy, but has been performing some stretching exercises at home.  She has been treated mainly with medications which have included prednisone 10 mg q.d.-b.i.d. for this herniated disk without any significant improvement.  The patient states she only took it for a few days and quit taking it as she did not like some of the side effects  including swelling and increased appetite.  She is currently taking Celebrex 200 mg b.i.d.-t.i.d.  She also takes Lorcet 10/650 mg as needed q.8h.  She also takes an aspirin a day and Synthroid for hypothyroidism.  She has not had any lumbar surgery nor has she had any spinal axial injections.  I review the health and history form and 14 point review of systems.  The patients pain is described as horrible, greater than 10/10 on a subjective scale, although she sits somewhat comfortably.  She describes her pain as constant, sharp with associated weakness.  Her symptoms are worse with walking, bending, sitting, working, and improved with rest and medications to some degree.  Her function and quality of life indexes have declined.  Her sleep is poor.  She has been prescribed amitriptyline at bedtime, but is not currently taking.  The patient denies bowel and bladder dysfunction with the exception of irritable bowel syndrome and increased urinary frequency.  She denies fever, chills, or weight loss.  She admits to occasional night sweats. She also admits to unexplained fatigue, occasional blurry vision, and numbness in her left small and ring fingers.  PAST MEDICAL HISTORY:  Kidney stones, hypothyroidism, fibrocystic breast disease.  PAST SURGICAL HISTORY:  Mastectomy, hysterectomy, tonsillectomy, right knee arthroscopy, appendectomy, and exploratory abdominal surgery x3.  FAMILY HISTORY:  Cancer, diabetes, thyroid disease.  SOCIAL HISTORY:  The patient smokes one-half to one pack of cigarettes per week.  Denies alcohol use.  She is married and does not currently work.  ALLERGIES:  PENICILLIN and KEFLEX.  MEDICATIONS:  Celebrex, prednisone (not currently taking), Lorcet 10, aspirin, Synthroid, amitriptyline (not currently taking).  PHYSICAL EXAMINATION  GENERAL:  Obese female in no acute distress.  VITAL SIGNS:  Blood pressure 156/78, pulse 90, respirations 16, O2 saturation 100% on  room air.  BACK:  Increased lumbar lordosis without scoliosis.  Level pelvis.  There is significant tenderness to very light palpation of the lumbar paraspinal muscles and gluteal muscles bilaterally, somewhat of an exaggerated response. Range of motion of the lumbar spine is guarded in all planes, but full with discomfort on flexion and extension as well as side bending and rotation bilaterally.  NEUROLOGIC:  Manual muscle testing is 5/5 bilateral upper and lower extremities.  Sensory examination is intact to light touch bilateral upper and lower extremities with the exception of mild decreased light touch in the left small finger and ring finger.  Muscle stretch reflexes are 0/4 bilateral lower extremities with poor relaxation.  Reflexes are 2+/4 bilateral biceps, triceps, brachioradialis, and pronator tares.  No hypertonicity noted in the upper and lower extremities bilaterally.  No heat, erythema, or edema in the upper or lower extremities.  EXTREMITIES:  Examination of the right knee reveals full range of motion without any effusions.  There is tenderness to palpation over the medial and lateral joint lines.  No gross ligamentous instability noted.  Negative anterior drawer, Lachman, McMurray tests, although patient has somewhat of an exaggerated pain response with just range of motion of the knee.  Straight leg raise is equivocal on the left, negative on the right.  FABER is negative bilaterally, but patient complains of knee pain bilaterally with the maneuver. Tinel test is negative over the left wrist at the median and ulnar nerves and negative at the left elbow over the ulnar nerve.  LABORATORIES:  Radiologic studies which accompany the chart are reviewed.  MRI of the lumbar spine dated January 07, 2001 reveals a small left paracentral disk herniation at L5-S1.  There is mild chronic broad based disk bulge present at L4-5 and also at L3-4 causing mild bilateral neural foraminal  stenosis.  Bone  scan dated September 14, 2001 to rule out malignancy reveals some degenerative changes in the lower thoracic spine without any changes in the symptomatic left hip.  Left hip x-ray dated August 27, 2001 is negative.  IMPRESSION: 1. Chronic low back pain with degenerative disk disease of the lumbar spine    including an L5-S1 disk herniation to the left with left lower extremity    radicular symptoms, primarily in an L5 distribution. 2. Right knee pain.  Suspect either osteoarthritis or patellofemoral syndrome.    This may also be in part due to patients underlying history of    fibromyalgia syndrome. 3. History of fibromyalgia syndrome with exaggerated pain responses diffusely. 4. History of hypothyroidism. 5. Numbness in the left ulnar hand, etiology uncertain.  PLAN: 1. I had a long discussion with Jean Aguilar regarding treatment options to    assist with her overall pain control.  Initially, I would like to start her    on some aquatic therapy for range of motion, stretching, strengthening, and    a low to non-impact aerobic exercise program.  This should help    considerably with her fibromyalgic symptoms as well as her low back pain. 2. Recommend a trial of lumbar epidural steroid injections to assist with    patients low back  pain and radicular component.  These were discussed in    detail including risks, benefits, limitations, and alternatives.  The    patient wished to proceed in this manner. 3. Continue Celebrex 200 mg b.i.d. 4. The patient may continue Lorcet 10 mg as needed t.i.d. for now, but will    likely wean her from this medication with the above noted treatment plan. 5. The patient is to return to clinic for lumbar epidural steroid injections.  The patient was educated on the above findings and recommendations and understands.  No barriers to communication. Dictated by:   Sondra Come, D.O. Attending Physician:  Sondra Come DD:  11/17/01 TD:   11/18/01 Job: [id number]

## 2010-10-19 NOTE — Consult Note (Signed)
NAME:  Jean Aguilar, Jean Aguilar                      ACCOUNT NO.:  [id number]   MEDICAL RECORD NO.:  [id number]                   PATIENT TYPE:  REC   LOCATION:  [facility]                                  FACILITY:  [facility]   PHYSICIAN:  Sondra Come, D.O.                 DATE OF BIRTH:  1936/04/09   DATE OF CONSULTATION:  03/12/2002  DATE OF DISCHARGE:                                   CONSULTATION   HISTORY OF PRESENT ILLNESS:  The patient returns to clinic today for  reevaluation.  She was last seen on January 18, 2002, at which time she  underwent a left L5-S1 transforaminal epidural steroid injection for low  back pain with persistent left lower extremity radicular symptoms in an L5  distribution. The patient states that overall she had been doing well but  now complains of significant low back pain and left hip pain pointing to her  left buttock region primarily. She denies any significant lower extremity  radicular symptoms as these have essentially resolved following the  injection.  She has not started aquatic therapy yet and we discussed this.  Her pain today is 9/10 on a subjective scale.  Function and quality of life  indices have declined.  She has a hard time sitting secondary to the pain in  her left buttock region.  She has to lean toward the right side.  She also  complains of increased pain with laying flat on her back.  In addition, she  complains of difficulty walking up and down steps because of the pain in her  left buttock.  I reviewed the health and history form and 14-point review of  systems.  She was previously taking Celebrex but has run out.  She also has  been taking hydrocodone 7.5 mg with some improvement in her symptoms  temporarily. She takes this very sparingly between one to three per day as  needed.  She previously took Lortab and I switched her to Norco to decrease  the Tylenol component; however, she states that the Norco causes her to  sweat whereas the  Lortab did not.   PHYSICAL EXAMINATION:  GENERAL APPEARANCE:  A healthy female in no acute  distress.  VITAL SIGNS:  Blood pressure 157/58, pulse 97, respiratory rate 18, O2  saturation 95% on room air.  NEUROLOGIC:  She sits leaning to the right.  Examination of the back reveals  level pelvis without scoliosis.  Palpatory examination reveals significant  tenderness to palpation in the left buttock over the sacroiliac joint  region.  There is minimal tenderness in the lumbar paraspinous region on the  left.  Manual muscle testing is 5/5 bilateral lower extremities.  Sensory  examination is intact to light touch bilateral lower extremities.  Muscle  stretch reflexes are symmetric bilateral lower extremities.  Pearlean Brownie test is  positive on the left, negative on the right.  Straight leg raising  is  negative bilaterally.   IMPRESSION:  1. Chronic low back pain.  I suspect the patient has a degree of sacroiliac     joint component given findings on today's physical examination.  This may     also be disc mediated as the patient does have a small disc herniation at     L5-S1.  She previously had fairly good relief following the     transforaminal epidural steroid injection, although today's examination     certainly does suggest SI joint component.  2. Degenerative disc disease of the lumbar spine.  3. History of fibromyalgia syndrome.  4. Osteoarthritis right knee.   PLAN:  1. I had a long discussion with the patient regarding treatment options.     Initially I will get her involved in physical therapy for range of     motion, stretching, sacroiliac joint mobilization, lumbar stabilization     and modalities as needed, leading to a home exercise program.  Would like     physical therapy two to three times per week for four weeks.  2. Continue Celebrex 200 mg one p.o. q.d. #30 with one refill.  3. Will switch back to Lortab 7.5 mg/500 mg one p.o. b.i.d. as needed #30     without refills.   4. Consider diagnostic therapeutic sacroiliac joint injection.  At this     point, will wait to see the results of physical therapy.  If she is not     getting any improvement, will offer a sacroiliac joint injection to the     patient.  5. The patient is to return to clinic in one month or sooner if needed.   The patient was educated on the above findings and recommendations and  understands.  There were no barriers to communication.                                                 Sondra Come, D.O.    JJW/MEDQ  D:  03/12/2002  T:  03/15/2002  Job:  [id number]   cc:   Myrtie Neither, MD

## 2010-11-17 ENCOUNTER — Emergency Department (HOSPITAL_COMMUNITY): Payer: Medicare Other

## 2010-11-17 ENCOUNTER — Emergency Department (HOSPITAL_COMMUNITY)
Admission: EM | Admit: 2010-11-17 | Discharge: 2010-11-17 | Disposition: A | Discharge status: Home / Self Care | Payer: Medicare Other | Attending: Emergency Medicine | Admitting: Emergency Medicine

## 2010-11-17 DIAGNOSIS — E119 Type 2 diabetes mellitus without complications: Secondary | ICD-10-CM | POA: Insufficient documentation

## 2010-11-17 DIAGNOSIS — Z87442 Personal history of urinary calculi: Secondary | ICD-10-CM | POA: Insufficient documentation

## 2010-11-17 DIAGNOSIS — M129 Arthropathy, unspecified: Secondary | ICD-10-CM | POA: Insufficient documentation

## 2010-11-17 DIAGNOSIS — J45909 Unspecified asthma, uncomplicated: Secondary | ICD-10-CM | POA: Insufficient documentation

## 2010-11-17 DIAGNOSIS — M25469 Effusion, unspecified knee: Secondary | ICD-10-CM | POA: Insufficient documentation

## 2010-11-17 DIAGNOSIS — M25569 Pain in unspecified knee: Secondary | ICD-10-CM | POA: Insufficient documentation

## 2010-11-17 DIAGNOSIS — I10 Essential (primary) hypertension: Secondary | ICD-10-CM | POA: Insufficient documentation

## 2010-11-17 LAB — GLUCOSE, CAPILLARY: Glucose-Capillary: 93 mg/dL (ref 70–99)

## 2010-11-17 LAB — SYNOVIAL CELL COUNT + DIFF, W/ CRYSTALS
Crystals, Fluid: NONE SEEN
Lymphocytes-Synovial Fld: 46 % — ABNORMAL HIGH (ref 0–20)
Monocyte-Macrophage-Synovial Fluid: 51 % (ref 50–90)
WBC, Synovial: 225 /mm3 — ABNORMAL HIGH (ref 0–200)

## 2010-11-17 LAB — PROTEIN, BODY FLUID

## 2010-11-17 LAB — GLUCOSE, SYNOVIAL FLUID: Glucose, Synovial Fluid: 89 mg/dL

## 2010-11-17 LAB — GRAM STAIN

## 2010-11-21 LAB — BODY FLUID CULTURE

## 2011-01-22 ENCOUNTER — Encounter (HOSPITAL_COMMUNITY)
Admission: RE | Admit: 2011-01-22 | Discharge: 2011-01-22 | Disposition: A | Discharge status: Home / Self Care | Payer: Medicare Other | Source: Ambulatory Visit | Attending: Orthopedic Surgery | Admitting: Orthopedic Surgery

## 2011-01-22 ENCOUNTER — Other Ambulatory Visit (HOSPITAL_COMMUNITY): Payer: Self-pay | Admitting: Orthopedic Surgery

## 2011-01-22 DIAGNOSIS — M23209 Derangement of unspecified meniscus due to old tear or injury, unspecified knee: Secondary | ICD-10-CM

## 2011-01-22 LAB — CBC
HCT: 39.5 % (ref 36.0–46.0)
MCHC: 33.9 g/dL (ref 30.0–36.0)
MCV: 90.6 fL (ref 78.0–100.0)
Platelets: 230 10*3/uL (ref 150–400)
RDW: 12.9 % (ref 11.5–15.5)
WBC: 6.4 10*3/uL (ref 4.0–10.5)

## 2011-01-22 LAB — SURGICAL PCR SCREEN
MRSA, PCR: NEGATIVE
Staphylococcus aureus: POSITIVE — AB

## 2011-01-22 LAB — COMPREHENSIVE METABOLIC PANEL
AST: 11 U/L (ref 0–37)
Albumin: 3.8 g/dL (ref 3.5–5.2)
BUN: 24 mg/dL — ABNORMAL HIGH (ref 6–23)
Chloride: 103 mEq/L (ref 96–112)
Creatinine, Ser: 1.35 mg/dL — ABNORMAL HIGH (ref 0.50–1.10)
Potassium: 4.6 mEq/L (ref 3.5–5.1)
Total Protein: 6.5 g/dL (ref 6.0–8.3)

## 2011-01-24 ENCOUNTER — Ambulatory Visit (HOSPITAL_COMMUNITY)
Admission: RE | Admit: 2011-01-24 | Discharge: 2011-01-25 | Disposition: A | Discharge status: Home / Self Care | Payer: Medicare Other | Source: Ambulatory Visit | Attending: Orthopedic Surgery | Admitting: Orthopedic Surgery

## 2011-01-24 DIAGNOSIS — M959 Acquired deformity of musculoskeletal system, unspecified: Secondary | ICD-10-CM | POA: Insufficient documentation

## 2011-01-24 DIAGNOSIS — M2340 Loose body in knee, unspecified knee: Secondary | ICD-10-CM | POA: Insufficient documentation

## 2011-01-24 DIAGNOSIS — IMO0001 Reserved for inherently not codable concepts without codable children: Secondary | ICD-10-CM | POA: Insufficient documentation

## 2011-01-24 DIAGNOSIS — Z01818 Encounter for other preprocedural examination: Secondary | ICD-10-CM | POA: Insufficient documentation

## 2011-01-24 DIAGNOSIS — Z01812 Encounter for preprocedural laboratory examination: Secondary | ICD-10-CM | POA: Insufficient documentation

## 2011-01-24 DIAGNOSIS — I10 Essential (primary) hypertension: Secondary | ICD-10-CM | POA: Insufficient documentation

## 2011-01-24 DIAGNOSIS — E119 Type 2 diabetes mellitus without complications: Secondary | ICD-10-CM | POA: Insufficient documentation

## 2011-01-24 DIAGNOSIS — M24669 Ankylosis, unspecified knee: Secondary | ICD-10-CM | POA: Insufficient documentation

## 2011-01-24 DIAGNOSIS — J449 Chronic obstructive pulmonary disease, unspecified: Secondary | ICD-10-CM | POA: Insufficient documentation

## 2011-01-24 DIAGNOSIS — M659 Unspecified synovitis and tenosynovitis, unspecified site: Secondary | ICD-10-CM | POA: Insufficient documentation

## 2011-01-24 DIAGNOSIS — M23302 Other meniscus derangements, unspecified lateral meniscus, unspecified knee: Secondary | ICD-10-CM | POA: Insufficient documentation

## 2011-01-24 DIAGNOSIS — F17200 Nicotine dependence, unspecified, uncomplicated: Secondary | ICD-10-CM | POA: Insufficient documentation

## 2011-01-24 DIAGNOSIS — Z01810 Encounter for preprocedural cardiovascular examination: Secondary | ICD-10-CM | POA: Insufficient documentation

## 2011-01-24 DIAGNOSIS — M23329 Other meniscus derangements, posterior horn of medial meniscus, unspecified knee: Secondary | ICD-10-CM | POA: Insufficient documentation

## 2011-01-24 DIAGNOSIS — G43909 Migraine, unspecified, not intractable, without status migrainosus: Secondary | ICD-10-CM | POA: Insufficient documentation

## 2011-01-24 DIAGNOSIS — J4489 Other specified chronic obstructive pulmonary disease: Secondary | ICD-10-CM | POA: Insufficient documentation

## 2011-01-24 DIAGNOSIS — E669 Obesity, unspecified: Secondary | ICD-10-CM | POA: Insufficient documentation

## 2011-01-24 LAB — URINALYSIS, ROUTINE W REFLEX MICROSCOPIC
Leukocytes, UA: NEGATIVE
Nitrite: NEGATIVE
Specific Gravity, Urine: 1.018 (ref 1.005–1.030)
Urobilinogen, UA: 0.2 mg/dL (ref 0.0–1.0)
pH: 5.5 (ref 5.0–8.0)

## 2011-01-24 LAB — GLUCOSE, CAPILLARY
Glucose-Capillary: 120 mg/dL — ABNORMAL HIGH (ref 70–99)
Glucose-Capillary: 109 mg/dL — ABNORMAL HIGH (ref 70–99)
Glucose-Capillary: 143 mg/dL — ABNORMAL HIGH (ref 70–99)
Glucose-Capillary: 132 mg/dL — ABNORMAL HIGH (ref 70–99)

## 2011-01-29 NOTE — H&P (Signed)
  NAME:  Jean Aguilar, Jean Aguilar NO.:  [id number]  MEDICAL RECORD NO.:  [id number]  LOCATION:  [facility]                         FACILITY:  [facility]  PHYSICIAN:  Myrtie Neither, MD      DATE OF BIRTH:  Sep 09, 1935  DATE OF ADMISSION:  01/24/2011 DATE OF DISCHARGE:                             HISTORY & PHYSICAL   CHIEF COMPLAINT:  Painful locking and giving away, left knee.  HISTORY OF PRESENT ILLNESS:  This is a 75 year old female who had been following in the office for internal derangement of the left knee, treated with anti-inflammatories, steroids, therapeutic injections with use of cane, with progressive worsening in the past 2 months.  Left knee pain causing more immobility.  PAST MEDICAL HISTORY:  Ulnar nerve transfer, left elbow; history of simple mastectomy, right side for fibrocystic disease; hysterectomy; tonsillectomy; four exploratory laparotomy; fibrocystic disease in the left breast; no cancer; appendectomy; arthroscopy, right knee; medical history of hypertension; degenerative joint disease; chronic bronchitis; migraine headaches; hiatal hernia with reflux; diabetes mellitus; fibromyalgia; degenerative disk disease, lumbar.  ALLERGIES:  PENICILLIN, TUBERCULIN, PPD, MORPHINE SULFATE, KEFLEX.  SOCIAL HISTORY:  History of use of tobacco.  No use of alcohol.  No history of illegal drug use.  REVIEW OF SYSTEMS:  Negative system.  Symptoms of chronic bronchitis, as well as asthma, degenerative disk disease, lumbar spine, low back pain, and symptoms of fibromyalgia.  FAMILY HISTORY:  High blood pressure, Alzheimer disease, and diabetes mellitus.  PHYSICAL EXAMINATION:  GENERAL:  Alert and oriented, in no acute distress. VITAL SIGNS:  Height 65 inches, weight 85.3 kg, temperature 98.9, pulse 95, respirations 20, O2 saturation 95%, blood pressure 139/71. HEAD:  Normocephalic. EYES:  Conjunctivae and sclerae clear. NECK:  Supple. CHEST:  Clear. CARDIAC:  S1 and  S2 regular. EXTREMITIES:  Left knee, +2 effusion.  Positive Murray test and palpable audible click.  Range of motion good, but lacks full extension. Negative drawer's.  Negative Lachman's test.  IMPRESSION:  Internal derangement, left knee; medial meniscal tear, left knee.  PLAN:  Arthroscopy, left knee.     Myrtie Neither, MD     AC/MEDQ  D:  01/24/2011  T:  01/24/2011  Job:  [id number]  Electronically Signed by Myrtie Neither MD on 01/29/2011 12:09:19 PM

## 2011-01-29 NOTE — Op Note (Signed)
  NAME:  Jean Aguilar, Jean Aguilar NO.:  [id number]  MEDICAL RECORD NO.:  [id number]  LOCATION:  [facility]                         FACILITY:  [facility]  PHYSICIAN:  Myrtie Neither, MD      DATE OF BIRTH:  [birth year]  DATE OF PROCEDURE:  01/24/2011 DATE OF DISCHARGE:                              OPERATIVE REPORT   PREOPERATIVE DIAGNOSIS:  Internal derangement, medial meniscal tear, left knee.  POSTOPERATIVE DIAGNOSIS:  Medial meniscal tear, chondral defect, medial femoral condyle, loose bodies, partial tear of lateral meniscus, and chronic synovitis with patellar plica.  ANESTHESIA:  General.  PROCEDURE: 1. Arthroscopic medial meniscectomy. 2. Abrasion chondroplasty, medial femoral condyle, removal of loose     bodies. 3. Complete synovectomy. 4. Partial lateral meniscectomy.  DESCRIPTION OF PROCEDURE:  The patient was taken to the operating room after given adequate preop medications, given general anesthesia and intubated.  The left knee was prepped with DuraPrep and draped in sterile manner.  Tourniquet was used for hemostasis.  One-half inch puncture wound made along the anterior, medial, and lateral joint line. Inflow was through the medial suprapatellar pouch area.  Inspection of the joint revealed a tear of the medial meniscus along the posterior horn, a large chondral defect involving medial femoral condyle with irregular borders and edges, and looseness of the entire subchondral surface.  There were loose bodies on the lateral compartment with partial degenerative tear of the lateral meniscus along the peripheral border from anterior to posterior laterally.  With the use of Synovial shaver, complete synovectomy was done with removal of the patellar plica.  With basket forceps, medial meniscectomy was done as well with the shaver.  Abrasion chondroplasty was done of medial femoral condyle and further smoothened with an arthroscopic rasp.  Basket forceps was used to  trim the edges of the lateral meniscus and removal of loose fragments in the lateral compartment.  Lateral femoral condyle was well preserved.  Copious and abundant irrigation was done.  Further inspection did not reveal any other loose fragment.  Wound closure was then done with 0 nylon.  Compressive dressing was applied.  The patient tolerated the procedure quite well and went to recovery room in stable and satisfactory condition.  The patient being kept 23-hour observation, ice packs, elevation, nonweightbearing on the left side with use of crutches or walker, and will be discharged in a.m. on Percocet one to two q.4 p.r.n. for pain, ice packs, nonweightbearing, and to return to the office in 1 week.  The patient will be discharged in stable and satisfactory condition.     Myrtie Neither, MD     AC/MEDQ  D:  01/24/2011  T:  01/24/2011  Job:  [id number]  Electronically Signed by Myrtie Neither MD on 01/29/2011 12:09:26 PM

## 2011-02-13 ENCOUNTER — Ambulatory Visit: Payer: Medicare Other | Attending: Orthopedic Surgery | Admitting: Physical Therapy

## 2011-02-13 DIAGNOSIS — M25569 Pain in unspecified knee: Secondary | ICD-10-CM | POA: Insufficient documentation

## 2011-02-13 DIAGNOSIS — M25669 Stiffness of unspecified knee, not elsewhere classified: Secondary | ICD-10-CM | POA: Insufficient documentation

## 2011-02-13 DIAGNOSIS — IMO0001 Reserved for inherently not codable concepts without codable children: Secondary | ICD-10-CM | POA: Insufficient documentation

## 2011-02-13 DIAGNOSIS — R262 Difficulty in walking, not elsewhere classified: Secondary | ICD-10-CM | POA: Insufficient documentation

## 2011-02-18 ENCOUNTER — Ambulatory Visit: Payer: Medicare Other | Admitting: Physical Therapy

## 2011-02-20 ENCOUNTER — Ambulatory Visit: Payer: Medicare Other | Admitting: Physical Therapy

## 2011-02-25 ENCOUNTER — Emergency Department (HOSPITAL_COMMUNITY): Payer: Medicare Other

## 2011-02-25 ENCOUNTER — Emergency Department (HOSPITAL_COMMUNITY)
Admission: EM | Admit: 2011-02-25 | Discharge: 2011-02-25 | Disposition: A | Discharge status: Home / Self Care | Payer: Medicare Other | Attending: Emergency Medicine | Admitting: Emergency Medicine

## 2011-02-25 DIAGNOSIS — I10 Essential (primary) hypertension: Secondary | ICD-10-CM | POA: Insufficient documentation

## 2011-02-25 DIAGNOSIS — R109 Unspecified abdominal pain: Secondary | ICD-10-CM | POA: Insufficient documentation

## 2011-02-25 DIAGNOSIS — E119 Type 2 diabetes mellitus without complications: Secondary | ICD-10-CM | POA: Insufficient documentation

## 2011-02-25 DIAGNOSIS — Z79899 Other long term (current) drug therapy: Secondary | ICD-10-CM | POA: Insufficient documentation

## 2011-02-25 DIAGNOSIS — J45909 Unspecified asthma, uncomplicated: Secondary | ICD-10-CM | POA: Insufficient documentation

## 2011-02-25 LAB — URINE MICROSCOPIC-ADD ON

## 2011-02-25 LAB — CBC
MCH: 31 pg (ref 26.0–34.0)
MCV: 92.2 fL (ref 78.0–100.0)
Platelets: 235 10*3/uL (ref 150–400)
RDW: 12.9 % (ref 11.5–15.5)
WBC: 6.1 10*3/uL (ref 4.0–10.5)

## 2011-02-25 LAB — COMPREHENSIVE METABOLIC PANEL
AST: 13 U/L (ref 0–37)
Albumin: 3.9 g/dL (ref 3.5–5.2)
Calcium: 9.6 mg/dL (ref 8.4–10.5)
Chloride: 100 mEq/L (ref 96–112)
Creatinine, Ser: 1.12 mg/dL — ABNORMAL HIGH (ref 0.50–1.10)

## 2011-02-25 LAB — URINALYSIS, ROUTINE W REFLEX MICROSCOPIC
Glucose, UA: NEGATIVE mg/dL
Hgb urine dipstick: NEGATIVE
Specific Gravity, Urine: 1.029 (ref 1.005–1.030)
Urobilinogen, UA: 0.2 mg/dL (ref 0.0–1.0)
pH: 5 (ref 5.0–8.0)

## 2011-02-25 LAB — DIFFERENTIAL
Basophils Relative: 0 % (ref 0–1)
Eosinophils Absolute: 0.3 10*3/uL (ref 0.0–0.7)
Eosinophils Relative: 5 % (ref 0–5)
Lymphs Abs: 2.9 10*3/uL (ref 0.7–4.0)

## 2011-02-25 MED ORDER — IOHEXOL 300 MG/ML  SOLN
100.0000 mL | Freq: Once | INTRAMUSCULAR | Status: AC | PRN
  Administered 2011-02-25: 100 mL via INTRAVENOUS

## 2011-02-26 ENCOUNTER — Encounter: Payer: Medicare Other | Admitting: Physical Therapy

## 2011-02-26 LAB — URINE CULTURE
Colony Count: NO GROWTH
Culture: NO GROWTH

## 2011-02-28 ENCOUNTER — Inpatient Hospital Stay (HOSPITAL_COMMUNITY)
Admission: EM | Admit: 2011-02-28 | Discharge: 2011-03-02 | DRG: 558 | Disposition: A | Discharge status: Home / Self Care | Payer: Medicare Other | Attending: Internal Medicine | Admitting: Internal Medicine

## 2011-02-28 ENCOUNTER — Emergency Department (HOSPITAL_COMMUNITY): Payer: Medicare Other

## 2011-02-28 ENCOUNTER — Encounter: Payer: Medicare Other | Admitting: Physical Therapy

## 2011-02-28 DIAGNOSIS — I10 Essential (primary) hypertension: Secondary | ICD-10-CM | POA: Diagnosis present

## 2011-02-28 DIAGNOSIS — IMO0002 Reserved for concepts with insufficient information to code with codable children: Secondary | ICD-10-CM | POA: Diagnosis present

## 2011-02-28 DIAGNOSIS — J449 Chronic obstructive pulmonary disease, unspecified: Secondary | ICD-10-CM | POA: Diagnosis present

## 2011-02-28 DIAGNOSIS — N179 Acute kidney failure, unspecified: Secondary | ICD-10-CM | POA: Diagnosis present

## 2011-02-28 DIAGNOSIS — M1990 Unspecified osteoarthritis, unspecified site: Secondary | ICD-10-CM | POA: Diagnosis present

## 2011-02-28 DIAGNOSIS — R7301 Impaired fasting glucose: Secondary | ICD-10-CM | POA: Diagnosis present

## 2011-02-28 DIAGNOSIS — K219 Gastro-esophageal reflux disease without esophagitis: Secondary | ICD-10-CM | POA: Diagnosis present

## 2011-02-28 DIAGNOSIS — M47817 Spondylosis without myelopathy or radiculopathy, lumbosacral region: Secondary | ICD-10-CM | POA: Diagnosis present

## 2011-02-28 DIAGNOSIS — M7130 Other bursal cyst, unspecified site: Principal | ICD-10-CM | POA: Diagnosis present

## 2011-02-28 DIAGNOSIS — IMO0001 Reserved for inherently not codable concepts without codable children: Secondary | ICD-10-CM | POA: Diagnosis present

## 2011-02-28 DIAGNOSIS — G43909 Migraine, unspecified, not intractable, without status migrainosus: Secondary | ICD-10-CM | POA: Diagnosis present

## 2011-02-28 DIAGNOSIS — K449 Diaphragmatic hernia without obstruction or gangrene: Secondary | ICD-10-CM | POA: Diagnosis present

## 2011-02-28 DIAGNOSIS — M5137 Other intervertebral disc degeneration, lumbosacral region: Secondary | ICD-10-CM | POA: Diagnosis present

## 2011-02-28 DIAGNOSIS — Z79899 Other long term (current) drug therapy: Secondary | ICD-10-CM

## 2011-02-28 DIAGNOSIS — M51379 Other intervertebral disc degeneration, lumbosacral region without mention of lumbar back pain or lower extremity pain: Secondary | ICD-10-CM | POA: Diagnosis present

## 2011-02-28 DIAGNOSIS — Z791 Long term (current) use of non-steroidal anti-inflammatories (NSAID): Secondary | ICD-10-CM

## 2011-02-28 DIAGNOSIS — J4489 Other specified chronic obstructive pulmonary disease: Secondary | ICD-10-CM | POA: Diagnosis present

## 2011-02-28 DIAGNOSIS — Z880 Allergy status to penicillin: Secondary | ICD-10-CM

## 2011-02-28 DIAGNOSIS — E876 Hypokalemia: Secondary | ICD-10-CM | POA: Diagnosis present

## 2011-02-28 LAB — LIPASE, BLOOD: Lipase: 14 U/L (ref 11–59)

## 2011-02-28 LAB — COMPREHENSIVE METABOLIC PANEL
Albumin: 4 g/dL (ref 3.5–5.2)
BUN: 12 mg/dL (ref 6–23)
CO2: 28 mEq/L (ref 19–32)
Calcium: 9.9 mg/dL (ref 8.4–10.5)
Chloride: 99 mEq/L (ref 96–112)
Creatinine, Ser: 1.16 mg/dL — ABNORMAL HIGH (ref 0.50–1.10)
GFR calc non Af Amer: 46 mL/min — ABNORMAL LOW (ref >60)
Total Bilirubin: 0.4 mg/dL (ref 0.3–1.2)

## 2011-02-28 LAB — CBC
Hemoglobin: 13.4 g/dL (ref 12.0–15.0)
MCH: 31.4 pg (ref 26.0–34.0)
MCHC: 34.8 g/dL (ref 30.0–36.0)
RDW: 12.9 % (ref 11.5–15.5)

## 2011-02-28 LAB — DIFFERENTIAL
Basophils Absolute: 0 10*3/uL (ref 0.0–0.1)
Basophils Relative: 0 % (ref 0–1)
Eosinophils Relative: 3 % (ref 0–5)
Monocytes Absolute: 0.3 10*3/uL (ref 0.1–1.0)
Monocytes Relative: 5 % (ref 3–12)
Neutro Abs: 2.8 10*3/uL (ref 1.7–7.7)

## 2011-02-28 LAB — POCT I-STAT TROPONIN I: Troponin i, poc: 0.04 ng/mL (ref 0.00–0.08)

## 2011-03-01 ENCOUNTER — Inpatient Hospital Stay (HOSPITAL_COMMUNITY): Payer: Medicare Other

## 2011-03-01 LAB — URINALYSIS, ROUTINE W REFLEX MICROSCOPIC
Nitrite: NEGATIVE
Protein, ur: NEGATIVE mg/dL

## 2011-03-01 LAB — CARDIAC PANEL(CRET KIN+CKTOT+MB+TROPI)
CK, MB: 2.8 ng/mL (ref 0.3–4.0)
Relative Index: INVALID (ref 0.0–2.5)
Total CK: 83 U/L (ref 7–177)
Total CK: 102 U/L (ref 7–177)
Troponin I: <0.3 ng/mL (ref <0.30)

## 2011-03-01 LAB — HEMOGLOBIN A1C
Hgb A1c MFr Bld: 6 % — ABNORMAL HIGH (ref <5.7)
Mean Plasma Glucose: 126 mg/dL — ABNORMAL HIGH (ref <117)

## 2011-03-01 LAB — COMPREHENSIVE METABOLIC PANEL
Alkaline Phosphatase: 52 U/L (ref 39–117)
BUN: 14 mg/dL (ref 6–23)
GFR calc Af Amer: 36 mL/min — ABNORMAL LOW (ref >60)
Glucose, Bld: 121 mg/dL — ABNORMAL HIGH (ref 70–99)
Potassium: 3 mEq/L — ABNORMAL LOW (ref 3.5–5.1)
Total Protein: 6.6 g/dL (ref 6.0–8.3)

## 2011-03-01 LAB — DIFFERENTIAL
Lymphocytes Relative: 42 % (ref 12–46)
Lymphs Abs: 2.7 10*3/uL (ref 0.7–4.0)
Monocytes Absolute: 0.5 10*3/uL (ref 0.1–1.0)
Monocytes Relative: 8 % (ref 3–12)
Neutro Abs: 2.9 10*3/uL (ref 1.7–7.7)

## 2011-03-01 LAB — GLUCOSE, CAPILLARY
Glucose-Capillary: 123 mg/dL — ABNORMAL HIGH (ref 70–99)
Glucose-Capillary: 73 mg/dL (ref 70–99)

## 2011-03-01 LAB — CBC
HCT: 37 % (ref 36.0–46.0)
Hemoglobin: 12.2 g/dL (ref 12.0–15.0)
MCH: 30.3 pg (ref 26.0–34.0)
MCHC: 33 g/dL (ref 30.0–36.0)
MCV: 92 fL (ref 78.0–100.0)

## 2011-03-01 LAB — MAGNESIUM: Magnesium: 1.9 mg/dL (ref 1.5–2.5)

## 2011-03-02 LAB — CBC
MCH: 30.7 pg (ref 26.0–34.0)
MCHC: 33 g/dL (ref 30.0–36.0)
MCV: 92.9 fL (ref 78.0–100.0)
Platelets: 203 10*3/uL (ref 150–400)
RDW: 13 % (ref 11.5–15.5)

## 2011-03-02 LAB — GLUCOSE, CAPILLARY: Glucose-Capillary: 104 mg/dL — ABNORMAL HIGH (ref 70–99)

## 2011-03-02 LAB — BASIC METABOLIC PANEL
Calcium: 8.6 mg/dL (ref 8.4–10.5)
Creatinine, Ser: 1.16 mg/dL — ABNORMAL HIGH (ref 0.50–1.10)
GFR calc Af Amer: 55 mL/min — ABNORMAL LOW (ref >60)

## 2011-03-02 NOTE — Discharge Summary (Signed)
NAMEMarland Kitchen  Jean Aguilar, Jean Aguilar NO.:  [id number]  MEDICAL RECORD NO.:  [id number]  LOCATION:  1510                         FACILITY:  [facility]  PHYSICIAN:  Hillery Aldo, M.D.   DATE OF BIRTH:  1935-10-26  DATE OF ADMISSION:  02/28/2011 DATE OF DISCHARGE:  03/02/2011                              DISCHARGE SUMMARY   PRIMARY CARE PHYSICIAN:  Jacalyn Lefevre, MD  ORTHOPEDIC SURGEON:  Dr. Montez Morita.  DISCHARGE DIAGNOSES: 1. Severe degenerative disk/degenerative joint disease of the lumbar     spine. 2. Synovial cyst, subligamentous, left L4-L5. 3. Hypokalemia. 4. Acute renal failure. 5. Hypertension. 6. Type 2 diabetes. 7. Gastroesophageal reflux disease. 8. Hiatal hernia. 9. Fibromyalgia. 10.History of chronic obstructive pulmonary disease/chronic     bronchitis. 11.Normocytic anemia.  DISCHARGE MEDICATIONS: 1. Vicodin 10/325 1 tablet p.o. q.6 h. p.r.n., pain. 2. Lactate OTC 3 capsules p.o. t.i.d. q.a.c. p.r.n. 3. Lisinopril/HCTZ 20/12.5 one tablet p.o. b.i.d. 4. Loratadine 10 mg p.o. daily. 5. Oxybutynin XL 10 mg p.o. daily. 6. Albuterol HFA 90 mcg 2 puffs inhaled t.i.d. p.r.n.  Note:  Following medications have been discontinued:  Naprosyn  BRIEF ADMISSION HPI:  The patient is a 75 year old female with past medical history of degenerative disk disease/degenerative joint disease, cared for by Dr. Montez Morita; who presented to the hospital with a chief complaint of left flank/sciatic pain.  She was evaluated in the emergency department on February 25, 2011, for similar complaints and was diagnosed with musculoskeletal pain, discharged home.  She now presented to the hospital on the date of admission with similar complaints and also diaphoresis and chills with low-grade fever and therefore, she was referred to the Hospitalist Service for further evaluation and treatment.  For the full details, please see the dictated report done by Dr. Gerri Lins.  PROCEDURES AND  DIAGNOSTIC STUDIES: 1. Acute abdominal series on February 28, 2011, showed no acute     cardiopulmonary disease.  Nonobstructive bowel gas pattern. 2. Pelvic ultrasound on February 28, 2011, showed no acute findings.     Status post hysterectomy. 3. MRI of the lumbar spine on March 01, 2011, showed progression     of already severe facet degeneration at L4-L5.  New/increased     synovial cyst on the left, which may be a source of pain.     Contribution to mild left lateral recess stenosis (L5 nerve level).     Stable lumbar degenerative changes otherwise with advanced facet     and disk degeneration, L3-L4 through L5-L1; resulting in chronic     moderate to severe right L3, left L4, and right L5 neural foraminal     stenosis.  Chronic advanced T11-T12 facet degeneration.  DISCHARGE LABORATORY VALUES:  Sodium was 142, potassium 4.0, chloride 105, bicarbonate 30, BUN 14, creatinine 1.16, glucose 85, calcium 8.6. White blood cell count was 4.1, hemoglobin 10.8, hematocrit 32.7, platelets 203.  Blood cultures were negative to date.  Hemoglobin A1c was 6%.  Cardiac markers were negative x3 sets.  Urinalysis was negative for nitrites, trace leukocytes with microscopy showing rare bacteria and 0 to 2 white blood cells.  Of note, urine cultures done on February 25, 2011, were negative.  HOSPITAL COURSE BY  PROBLEM: 1. Left flank pain/sciatica secondary to degenerative disk     disease/degenerative joint disease/left synovial cyst at L4-L5:     The patient was admitted and initially there was some clinical     concern for pyelonephritis.  The patient was put on empiric     antibiotic therapy and a further diagnostic evaluation was     undertaken.  She was treated with Levaquin, but this was     discontinued at discharge, given the fact that the patient did not     have any convincing evidence of infection and also that she had a     negative urine culture done on her previous evaluation  on February 25, 2011.  The patient's physical exam findings were more     consistent with a sciatic-type pain and therefore, she was re-     imaged with an MRI of the lumbar spine.  Images done back in 2010     did show significant degenerative disk disease as well as a     synovial cyst and repeat imaging showed progression of her     degenerative disk disease, degenerative joint disease, and left     synovial cyst.  The patient is under the care of Dr. Montez Morita and we     have encouraged her to follow up with him for further definitive     treatment plan regarding her chronic pain.  She was sent home with     a prescription for Vicodin 10/325 (#40 tablets) and instructed to     follow up at her regularly scheduled appointment time on March 06, 2011. 2. Hypokalemia:  Likely due to diuretic therapy.  Her potassium is     fully repleted. 3. Acute renal failure:  The patient's admission creatinine was 1.68.     Her ACE inhibitor, hydrochlorothiazide, and nonsteroidal     antiinflammatory medicines were all held and her creatinine at     discharge is back to her baseline value at 1.16.  The patient has     been instructed not to use any further NSAID therapy.  She can     safely resume her ACE inhibitor and hydrochlorothiazide, but should     be reevaluated by her primary care physician in the next few weeks     to determine if her electrolytes and renal function are stable on     this regimen. 4. Hypertension:  The patient's blood pressure at discharge is 134/72. 5. Questionable type 2 diabetes:  The patient is not on any diabetic     medications and a check of her hemoglobin A1c revealed that it is     6%.  She probably does not have diabetes, but rather an impaired     fasting glucose.  This can be controlled with diet. 6. Gastroesophageal reflux disease/hiatal hernia.  The patient did not     have any complaints while in the hospital. 7. COPD/chronic bronchitis:  The patient  can continue on     bronchodilator therapy p.r.n.  DISPOSITION:  The patient is medically stable and will be discharged home.  CONDITION ON DISCHARGE:  Improved.  Time spent coordinating care of her discharge, discharge instruction including face-to-face time equals 35 minutes.  DISCHARGE INSTRUCTIONS: 1. Activity:  Increase activity slowly. 2. Diet:  Low-sodium, heart-healthy, diabetic. 3. Followup:  With primary care in 2 to 4 weeks.  Follow up with Dr.  Montez Morita on March 06, 2011, at regularly scheduled appointment time.     Hillery Aldo, M.D.     CR/MEDQ  D:  03/02/2011  T:  03/02/2011  Job:  [id number]  cc:   Bertram Millard. Hyacinth Meeker, M.D. Fax: [phone]  Dr. Montez Morita  Electronically Signed by Hillery Aldo M.D. on 03/02/2011 03:13:51 PM

## 2011-03-07 LAB — CULTURE, BLOOD (ROUTINE X 2)
Culture  Setup Time: 201209281428
Culture: NO GROWTH

## 2011-03-11 NOTE — H&P (Signed)
NAME:  Jean Aguilar, Jean Aguilar NO.:  [id number]  MEDICAL RECORD NO.:  [id number]  LOCATION:  WLED                         FACILITY:  [facility]  PHYSICIAN:  Tikia Skilton, DO         DATE OF BIRTH:  06/24/1935  DATE OF ADMISSION:  02/28/2011 DATE OF DISCHARGE:                             HISTORY & PHYSICAL   CHIEF COMPLAINT:  Left flank pain.  HISTORY OF PRESENT ILLNESS:  Patient is a 75 year old African American female who states she has had worsening left flank pain over the last 4 days.  Patient was seen here on September 24 and sent home with a presumed diagnosis of musculoskeletal pain.  Patient presents again with a worsening pain.  Patient has also had sweats and chills.  Here she has had a low grade fever.  PAST MEDICAL HISTORY:  Significant for: 1. Hypertension. 2. Migraine headache. 3. Hiatal hernia with reflux. 4. Diabetes mellitus type 2. 5. Fibromyalgia. 6. Degenerative disk disease. 7. COPD. 8. Chronic bronchitis. 9. DJD.  PAST SURGICAL HISTORY:  Significant for: 1. Ulnar nerve transfer of the left elbow. 2. Simple mastectomy on the right secondary to fibrocystic disease. 3. Appendectomy. 4. Total abdominal hysterectomy, exploratory laparotomy. 5. Left knee arthroscopy.  MEDICATIONS:  At home include: 1. Lactate 3 capsules three times daily before meals. 2. Loratadine 10 mg one p.o. daily. 3. Ventolin HFA 90 mcg 2 puffs three times daily. 4. Oxybutynin 10 mg one p.o. daily. 5. Naproxen 500 mg one p.o. b.i.d. 6. Lisinopril/hydrochlorothiazide 20/12.5 one p.o. twice daily. 7. Hydrocodone and acetaminophen 10/325 one p.o. every 6 hours.  ALLERGIES: 1. PENICILLIN. 2. TUBERCULIN. 3. PPD. 4. MORPHINE SULFATE. 5. CEPHALEXIN.  SOCIAL HISTORY:  Patient used to smoke remotely.  No alcohol.  No recreational drug use.  FAMILY HISTORY:  Positive for hypertension, Alzheimer's and diabetes mellitus.  REVIEW OF SYSTEMS:  CONSTITUTIONAL:  Positive for  low grade fever. Positive for chills.  Positive for sweats.  Positive for malaise.  CNS: No headaches.  No seizures.  No limb weakness.  ENT:  No nasal congestion.  No throat pain.  No coryza.  CARDIOVASCULAR:  No chest pain.  No palpitations.  No orthopnea.  RESPIRATORY:  Negative for cough.  Negative for wheeze.  Negative for shortness breath. GASTROINTESTINAL:  Positive for mild nausea.  No vomiting.  No constipation.  No diarrhea.  Positive for left flank pain. GENITOURINARY:  Positive for left flank pain.  Negative for dysuria. Negative for hematuria.  RENAL:  No swelling.  No pruritus.  Positive for left flank pain.  MUSCULOSKELETAL:  Negative for other back pain. Positive for fibromyalgia and chronic musculoskeletal pains. HEMATOLOGICAL:  No easy bruising.  No purpura.  No clots.  LYMPH:  No lymphadenopathy.  No painful symptoms, specific lymph swelling. PSYCHIATRIC:  No anxiety.  No depression.  No insomnia.  PHYSICAL EXAM:  VITAL SIGNS:  Temperature 99.9, pulse 83, respiratory rate 20, blood pressure 135/80.  Urinalysis is weakly positive for UTI on the 24th with a trace of leukocyte esterase and some bacteria.  A transvaginal ultrasound was performed today and was negative.  Pelvic ultrasound was performed today and was negative.  Three view abdomen  and chest showed no acute cardiopulmonary disease and normal nonobstructive bowel gas pattern in the abdomen.  Sodium 139, potassium 3.4, chloride 99, CO2 28, BUN is 12, creatinine 1.16.  T bili 0.4, AST 12, ALT 10, alk phos 59, total protein 7.2, albumin 4.0, lipase 14, calcium 7.9, troponin-I 0.04.  ASSESSMENT: 1. Possible pyelonephritis with severe left flank pain, history of     fever, malaise.  Patient does not have an elevated white count, but     she does have a low-grade temperature and these other     constitutional symptoms as well as severe left flank pain and a     weakly positive urinalysis. 2. Severe left flank  pain, possibly secondary to pyelonephritis,     possibly musculoskeletal. 3. Diabetes mellitus. 4. Chronic obstructive pulmonary disease. 5. Hypertension. 6. Fibromyalgia.  PLAN: 1. Admit. 2. Blood cultures x2. 3. Repeat urinalysis. 4. IV antibiotics. 5. Pain control. 6. Fingerstick blood sugar, sliding scale insulin. 7. Nebulizer treatments and steroids. 8. IV fluids.  I spent 45 minutes on this admission.          ______________________________ Fran Lowes, DO     AS/MEDQ  D:  02/28/2011  T:  02/28/2011  Job:  [id number]  Electronically Signed by Fran Lowes DO on 03/11/2011 03:46:33 PM

## 2011-08-29 ENCOUNTER — Encounter (HOSPITAL_COMMUNITY): Payer: Self-pay | Admitting: *Deleted

## 2011-08-29 ENCOUNTER — Emergency Department (HOSPITAL_COMMUNITY): Payer: Medicare Other

## 2011-08-29 ENCOUNTER — Emergency Department (HOSPITAL_COMMUNITY)
Admission: EM | Admit: 2011-08-29 | Discharge: 2011-08-29 | Disposition: A | Discharge status: Home / Self Care | Payer: Medicare Other | Attending: Emergency Medicine | Admitting: Emergency Medicine

## 2011-08-29 DIAGNOSIS — I10 Essential (primary) hypertension: Secondary | ICD-10-CM | POA: Insufficient documentation

## 2011-08-29 DIAGNOSIS — F17200 Nicotine dependence, unspecified, uncomplicated: Secondary | ICD-10-CM | POA: Insufficient documentation

## 2011-08-29 DIAGNOSIS — J111 Influenza due to unidentified influenza virus with other respiratory manifestations: Secondary | ICD-10-CM | POA: Insufficient documentation

## 2011-08-29 DIAGNOSIS — E119 Type 2 diabetes mellitus without complications: Secondary | ICD-10-CM | POA: Insufficient documentation

## 2011-08-29 HISTORY — DX: Essential (primary) hypertension: I10

## 2011-08-29 LAB — DIFFERENTIAL
Eosinophils Absolute: 0 10*3/uL (ref 0.0–0.7)
Eosinophils Relative: 0 % (ref 0–5)
Lymphocytes Relative: 17 % (ref 12–46)
Lymphs Abs: 1.2 10*3/uL (ref 0.7–4.0)
Monocytes Absolute: 0.1 10*3/uL (ref 0.1–1.0)

## 2011-08-29 LAB — CBC
HCT: 40.9 % (ref 36.0–46.0)
MCH: 31.5 pg (ref 26.0–34.0)
MCV: 90.7 fL (ref 78.0–100.0)
Platelets: 296 10*3/uL (ref 150–400)
RBC: 4.51 MIL/uL (ref 3.87–5.11)
RDW: 12.6 % (ref 11.5–15.5)

## 2011-08-29 LAB — BASIC METABOLIC PANEL
CO2: 28 mEq/L (ref 19–32)
Calcium: 9.7 mg/dL (ref 8.4–10.5)
Creatinine, Ser: 0.92 mg/dL (ref 0.50–1.10)
GFR calc non Af Amer: 60 mL/min — ABNORMAL LOW (ref >90)
Glucose, Bld: 145 mg/dL — ABNORMAL HIGH (ref 70–99)
Sodium: 140 mEq/L (ref 135–145)

## 2011-08-29 MED ORDER — IBUPROFEN 200 MG PO TABS
600.0000 mg | ORAL_TABLET | Freq: Once | ORAL | Status: AC
  Administered 2011-08-29: 600 mg via ORAL
  Filled 2011-08-29: qty 1

## 2011-08-29 MED ORDER — OSELTAMIVIR PHOSPHATE 75 MG PO CAPS: 75.0000 mg | ORAL_CAPSULE | Freq: Two times a day (BID) | ORAL | Status: AC

## 2011-08-29 MED ORDER — ALBUTEROL SULFATE HFA 108 (90 BASE) MCG/ACT IN AERS: 2.0000 | INHALATION_SPRAY | RESPIRATORY_TRACT | Status: DC | PRN

## 2011-08-29 MED ORDER — IPRATROPIUM BROMIDE 0.02 % IN SOLN
0.5000 mg | Freq: Once | RESPIRATORY_TRACT | Status: AC
  Administered 2011-08-29: 0.5 mg via RESPIRATORY_TRACT
  Filled 2011-08-29: qty 2.5

## 2011-08-29 MED ORDER — HYDROCODONE-ACETAMINOPHEN 5-325 MG PO TABS
1.0000 | ORAL_TABLET | Freq: Once | ORAL | Status: AC
  Administered 2011-08-29: 1 via ORAL
  Filled 2011-08-29: qty 1

## 2011-08-29 MED ORDER — HYDROCODONE-ACETAMINOPHEN 5-325 MG PO TABS: 1.0000 | ORAL_TABLET | ORAL | Status: AC | PRN

## 2011-08-29 MED ORDER — ALBUTEROL SULFATE (5 MG/ML) 0.5% IN NEBU
2.5000 mg | INHALATION_SOLUTION | Freq: Once | RESPIRATORY_TRACT | Status: AC
  Administered 2011-08-29: 2.5 mg via RESPIRATORY_TRACT
  Filled 2011-08-29: qty 0.5

## 2011-08-29 NOTE — Discharge Instructions (Signed)
Influenza, Adult Influenza ("the flu") is a viral infection of the respiratory tract. It causes chills, fever, cough, headache, body aches, and sore throat. Influenza in general will make you feel sicker than when you have a common cold. Symptoms of the illness typically last a few days. Cough and fatigue may continue for as long as 7 to 10 days. Influenza is highly contagious. It spreads easily to others in the droplets from coughs and sneezes. People frequently become infected by touching something that was recently contaminated with the virus and then touch their mouth, nose or eyes. This infection is caused by a virus. Symptoms will not be reduced or improved by taking an antibiotic. Antibiotics are medications that kill bacteria, not viruses. DIAGNOSIS  Diagnosis of influenza is often made based on the history and physical examination as well as the presence of influenza reports occurring in your community. Testing can be done if the diagnosis is not certain. TREATMENT  Since influenza is caused by a virus, antibiotics are not helpful. Your caregiver may prescribe antiviral medicines to shorten the illness and lessen the severity. Your caregiver may also recommend influenza vaccination and/or antiviral medicines for your family members in order to prevent the spread of influenza to them. HOME CARE INSTRUCTIONS  DO NOT GIVE ASPIRIN TO PERSONS WITH INFLUENZA WHO ARE UNDER 74 YEARS OF AGE. This could lead to brain and liver damage (Reye's syndrome). Read the label on over-the-counter medicines.   Stay home from work or school if at all possible until most of your symptoms are gone.   Only take over-the-counter or prescription medicines for pain, discomfort, or fever as directed by your caregiver.   Use a cool mist humidifier to increase air moisture. This will make breathing easier.   Rest until your temperature is nearly normal: 98.6 F (37 C). This usually takes 3 to 4 days. Be sure you get  plenty of rest.   Drink at least eight, eight-ounce glasses of fluids per day. Fluids include water, juice, broth, gelatin, or lemonade.   Cover your mouth and nose when coughing or sneezing and wash your hands often to prevent the spread of this virus to other persons.  PREVENTION  Annual influenza vaccination (flu shots) is the best way to avoid getting influenza. An annual flu shot is now routinely recommended for all adults in the [place] SEEK MEDICAL CARE IF:   You develop shortness of breath while resting.   You have a deep cough with production of mucous or chest pain.   You develop nausea (feeling sick to your stomach), vomiting, or diarrhea.  SEEK IMMEDIATE MEDICAL CARE IF:   You have difficulty breathing, become short of breath, or your skin or nails turn bluish.   You develop severe neck pain or stiffness.   You develop a severe headache, facial pain, or earache.   You have a fever.   You develop nausea or vomiting that cannot be controlled.  Document Released: 05/17/2000 Document Revised: 05/09/2011 Document Reviewed: 03/22/2009 [facility] Patient Information 2012 [place], [place].  Oseltamivir capsules What is this medicine? OSELTAMIVIR (os el TAM i vir) is an antiviral medicine. It is used to prevent and to treat some kinds of influenza or the flu. It will not work for colds or other viral infections. This medicine may be used for other purposes; ask your health care provider or pharmacist if you have questions. What should I tell my health care provider before I take this medicine? They need to know if  you have any of the following conditions: -heart disease -immune system problems -kidney disease -liver disease -lung disease -an unusual or allergic reaction to oseltamivir, other medicines, foods, dyes, or preservatives -pregnant or trying to get pregnant -breast-feeding How should I use this medicine? Take this medicine by mouth with a glass of water. Follow the  directions on the prescription label. Start this medicine at the first sign of flu symptoms. You can take it with or without food. If it upsets your stomach, take it with food. Take your medicine at regular intervals. Do not take your medicine more often than directed. Take all of your medicine as directed even if you think you are better. Do not skip doses or stop your medicine early. Talk to your pediatrician regarding the use of this medicine in children. While this drug may be prescribed for children as young as 48 year old for selected conditions, precautions do apply. Overdosage: If you think you have taken too much of this medicine contact a poison control center or emergency room at once. NOTE: This medicine is only for you. Do not share this medicine with others. What if I miss a dose? If you miss a dose, take it as soon as you remember. If it is almost time for your next dose (within 2 hours), take only that dose. Do not take double or extra doses. What may interact with this medicine? Interactions are not expected. This list may not describe all possible interactions. Give your health care provider a list of all the medicines, herbs, non-prescription drugs, or dietary supplements you use. Also tell them if you smoke, drink alcohol, or use illegal drugs. Some items may interact with your medicine. What should I watch for while using this medicine? Visit your doctor or health care professional for regular check ups. Tell your doctor if your symptoms do not start to get better or if they get worse. If you have the flu, you may be at an increased risk of developing seizures, confusion, or abnormal behavior. This occurs early in the illness, and more frequently in children and teens. These events are not common, but may result in accidental injury to the patient. Families and caregivers of patients should watch for signs of unusual behavior and contact a doctor or health care professional right away  if the patient shows signs of unusual behavior. This medicine is not a substitute for the flu shot. Talk to your doctor each year about an annual flu shot. What side effects may I notice from receiving this medicine? Side effects that you should report to your doctor or health care professional as soon as possible: -allergic reactions like skin rash, itching or hives, swelling of the face, lips, or tongue -anxiety, confusion, unusual behavior -breathing problems -hallucination, loss of contact with reality -redness, blistering, peeling or loosening of the skin, including inside the mouth -seizures Side effects that usually do not require medical attention (report to your doctor or health care professional if they continue or are bothersome): -cough -diarrhea -dizziness -headache -nausea, vomiting -stomach pain This list may not describe all possible side effects. Call your doctor for medical advice about side effects. You may report side effects to FDA at 1-800-FDA-1088. Where should I keep my medicine? Keep out of the reach of children. Store at room temperature between 15 and 30 degrees C (59 and 86 degrees F). Throw away any unused medicine after the expiration date. NOTE: This sheet is a summary. It may not cover all  possible information. If you have questions about this medicine, talk to your doctor, pharmacist, or health care provider.  2012, [employer]. (12/15/2007 4:36:17 PM)

## 2011-08-29 NOTE — ED Notes (Signed)
Pt reports not feeling well since last night-reports cough, mild SOB, fever and chills.

## 2011-08-29 NOTE — ED Provider Notes (Signed)
History     CSN: [id number]  Arrival date & time 08/29/11  1613   First MD Initiated Contact with Patient 08/29/11 1630      Chief Complaint  Patient presents with  . Cough  . Fever  . Shortness of Breath    (Consider location/radiation/quality/duration/timing/severity/associated sxs/prior treatment) Patient is a 76 y.o. female presenting with cough. The history is provided by the patient.  Cough Associated symptoms include shortness of breath.  She had onset yesterday of a cough productive of sputum which she is not able to expectorate. She has a history of asthma and her dyspnea is slightly worse than normal. She has not had any fever, but she has had chills. She is complaining of generalized arthralgias and myalgias. She denies sore throat and nasal congestion. There's been no nausea, vomiting, diarrhea. She denies any chest pain. She has been using her albuterol inhaler which gives incomplete and temporary relief of the cough. She relates that she tends to get sick every time she visits her mother who is in a nursing home, and she had visited their 2 days ago. Symptoms are described as severe. Nothing makes them worse.  Past Medical History  Diagnosis Date  . Hypertension   . Diabetes mellitus     Past Surgical History  Procedure Date  . Mastectomy   . Appendectomy   . Abdominal hysterectomy     No family history on file.  History  Substance Use Topics  . Smoking status: Current Some Day Smoker  . Smokeless tobacco: Not on file  . Alcohol Use: No    OB History    Grav Para Term Preterm Abortions TAB SAB Ect Mult Living                  Review of Systems  Constitutional: Positive for fever.  Respiratory: Positive for cough and shortness of breath.   All other systems reviewed and are negative.    Allergies  Keflex; Morphine and related; and Penicillins  Home Medications  No current outpatient prescriptions on file.  BP 189/95  Pulse 91  Temp(Src)  99.7 F (37.6 C) (Oral)  Resp 20  SpO2 99%  Physical Exam  Nursing note and vitals reviewed.  76 year old female who is resting comfortably and in no acute distress. Vital signs are significant for moderate hypertension with blood pressure 189/95. Oxygen saturation is 99% which is normal. Head is normocephalic and atraumatic. PERRLA, EOMI. Oropharynx is clear. Neck is nontender and supple without adenopathy or JVD. Lungs have a slightly prolonged exhalation phase and scattered rhonchi. No rales or wheezes are heard. Heart has regular rate and rhythm without murmur. There is no chest wall tenderness. Abdomen is soft, flat, nontender without masses or hepatosplenomegaly. Extremities have no cyanosis or edema, full range of motion is present. Skin is warm and dry without rash. Neurologic: Mental status is normal, cranial nerves are intact, there no focal motor or sensory deficits.  ED Course  Procedures (including critical care time)  She feels better after albuterol and Atrovent. She is requested hydrocodone for her chronic back pain and she'll be given a prescription for a small amount of hydrocodone acetaminophen at home. She is advised of x-ray and lab findings. She's given a refill on her albuterol inhaler and she's given a prescription for Tamiflu. She's given the phone number for [facility] to try and reestablish herself with a primary care physician.  1. Influenza       MDM  Respiratory  tract infection in a pattern that seems most consistent with influenza. Chest x-ray will be obtained to rule out pneumonia. She will be given albuterol with Atrovent and reassessed. She'll be given ibuprofen for achiness. Of note, although she does not technically have a fever, temperature of 99.7 in the late afternoon is likely to become a fever in the evening hours.        Dione Booze, MD 08/29/11 [place] Moment

## 2011-10-23 ENCOUNTER — Ambulatory Visit
Admission: RE | Admit: 2011-10-23 | Discharge: 2011-10-23 | Disposition: A | Discharge status: Home / Self Care | Payer: Medicare Other | Source: Ambulatory Visit | Attending: Orthopedic Surgery | Admitting: Orthopedic Surgery

## 2011-10-23 ENCOUNTER — Other Ambulatory Visit: Payer: Self-pay | Admitting: Orthopedic Surgery

## 2011-10-23 DIAGNOSIS — M25519 Pain in unspecified shoulder: Secondary | ICD-10-CM

## 2011-10-23 DIAGNOSIS — M542 Cervicalgia: Secondary | ICD-10-CM

## 2011-10-26 ENCOUNTER — Emergency Department (HOSPITAL_COMMUNITY)
Admission: EM | Admit: 2011-10-26 | Discharge: 2011-10-26 | Disposition: A | Discharge status: Home / Self Care | Payer: Medicare Other | Attending: Emergency Medicine | Admitting: Emergency Medicine

## 2011-10-26 ENCOUNTER — Encounter (HOSPITAL_COMMUNITY): Payer: Self-pay | Admitting: *Deleted

## 2011-10-26 DIAGNOSIS — E119 Type 2 diabetes mellitus without complications: Secondary | ICD-10-CM | POA: Insufficient documentation

## 2011-10-26 DIAGNOSIS — I10 Essential (primary) hypertension: Secondary | ICD-10-CM | POA: Insufficient documentation

## 2011-10-26 DIAGNOSIS — R51 Headache: Secondary | ICD-10-CM | POA: Insufficient documentation

## 2011-10-26 DIAGNOSIS — R42 Dizziness and giddiness: Secondary | ICD-10-CM | POA: Insufficient documentation

## 2011-10-26 DIAGNOSIS — M542 Cervicalgia: Secondary | ICD-10-CM | POA: Insufficient documentation

## 2011-10-26 DIAGNOSIS — Z79899 Other long term (current) drug therapy: Secondary | ICD-10-CM | POA: Insufficient documentation

## 2011-10-26 HISTORY — DX: Unspecified osteoarthritis, unspecified site: M19.90

## 2011-10-26 MED ORDER — LORAZEPAM 1 MG PO TABS: 0.5000 mg | ORAL_TABLET | Freq: Three times a day (TID) | ORAL | Status: AC | PRN

## 2011-10-26 MED ORDER — MECLIZINE HCL 25 MG PO TABS: 25.0000 mg | ORAL_TABLET | Freq: Four times a day (QID) | ORAL | Status: AC | PRN

## 2011-10-26 MED ORDER — MECLIZINE HCL 25 MG PO TABS
25.0000 mg | ORAL_TABLET | Freq: Once | ORAL | Status: AC
  Administered 2011-10-26: 25 mg via ORAL
  Filled 2011-10-26: qty 1

## 2011-10-26 MED ORDER — LORAZEPAM 0.5 MG PO TABS
0.5000 mg | ORAL_TABLET | Freq: Once | ORAL | Status: AC
  Administered 2011-10-26: 0.5 mg via ORAL
  Filled 2011-10-26: qty 1

## 2011-10-26 NOTE — ED Notes (Signed)
Pt reports feeling dizzy starting two days ago and became worse this AM. Pt reports while laying in bed she turned her head and felt like the room was spinning. Pt reports getting imaging done on Wednesday that showed atherosclerosis of the carotid artery.  Pt reports she felt like she was going to pass out, but did not lose consciousness and also reports nausea without vomiting.  Pt also reports neck pain x1 week with more pain to left lateral neck. Pt reports she has been taking Vicodin 7.5mg  for pain with one dose yesterday.

## 2011-10-26 NOTE — ED Notes (Signed)
Discharge instructions reviewed w/ pt., verbalizes understanding. Two prescriptions provided at discharge. 

## 2011-10-26 NOTE — Discharge Instructions (Signed)
Benign Positional Vertigo Vertigo means you feel like you or your surroundings are moving when they are not. Benign positional vertigo is the most common form of vertigo. Benign means that the cause of your condition is not serious. Benign positional vertigo is more common in older adults. CAUSES  Benign positional vertigo is the result of an upset in the labyrinth system. This is an area in the middle ear that helps control your balance. This may be caused by a viral infection, head injury, or repetitive motion. However, often no specific cause is found. SYMPTOMS  Symptoms of benign positional vertigo occur when you move your head or eyes in different directions. Some of the symptoms may include:  Loss of balance and falls.   Vomiting.   Blurred vision.   Dizziness.   Nausea.   Involuntary eye movements (nystagmus).  DIAGNOSIS  Benign positional vertigo is usually diagnosed by physical exam. If the specific cause of your benign positional vertigo is unknown, your caregiver may perform imaging tests, such as magnetic resonance imaging (MRI) or computed tomography (CT). TREATMENT  Your caregiver may recommend movements or procedures to correct the benign positional vertigo. Medicines such as meclizine, benzodiazepines, and medicines for nausea may be used to treat your symptoms. In rare cases, if your symptoms are caused by certain conditions that affect the inner ear, you may need surgery. HOME CARE INSTRUCTIONS   Follow your caregiver's instructions.   Move slowly. Do not make sudden body or head movements.   Avoid driving.   Avoid operating heavy machinery.   Avoid performing any tasks that would be dangerous to you or others during a vertigo episode.   Drink enough fluids to keep your urine clear or pale yellow.  SEEK IMMEDIATE MEDICAL CARE IF:   You develop problems with walking, weakness, numbness, or using your arms, hands, or legs.   You have difficulty speaking.   You  develop severe headaches.   Your nausea or vomiting continues or gets worse.   You develop visual changes.   Your family or friends notice any behavioral changes.   Your condition gets worse.   You have a fever.   You develop a stiff neck or sensitivity to light.  MAKE SURE YOU:   Understand these instructions.   Will watch your condition.   Will get help right away if you are not doing well or get worse.  Document Released: 02/25/2006 Document Revised: 05/09/2011 Document Reviewed: 02/07/2011 [facility] Patient Information 2012 [place], [place].  Medications for dizziness and relaxation.  Rest. Followup your primary care Dr.

## 2011-10-26 NOTE — ED Notes (Signed)
Pt states feels like "dizzy fish", room is spinning when she closes her eyes. Nausea w/o emesis, and back of head is hurting.

## 2011-10-26 NOTE — ED Provider Notes (Addendum)
History     CSN: [id number]  Arrival date & time 10/26/11  0919   First MD Initiated Contact with Patient 10/26/11 [phone]      Chief Complaint  Patient presents with  . Near Syncope  . Dizziness  . Neck Pain  . Nausea  . Headache    (Consider location/radiation/quality/duration/timing/severity/associated sxs/prior treatment) HPI...dizziness for 2 days, worse this morning after are turning her head briskly. Positioning makes symptoms worse.  Rest makes symptoms better.  No loss of consciousness or neurological deficits. Review of systems positive for neck pain. Symptoms are minimal to moderate  Past Medical History  Diagnosis Date  . Hypertension   . Diabetes mellitus   . Arthritis     Past Surgical History  Procedure Date  . Mastectomy   . Appendectomy   . Abdominal hysterectomy   . Tonsillectomy     No family history on file.  History  Substance Use Topics  . Smoking status: Current Some Day Smoker  . Smokeless tobacco: Not on file  . Alcohol Use: No    OB History    Grav Para Term Preterm Abortions TAB SAB Ect Mult Living                  Review of Systems  All other systems reviewed and are negative.    Allergies  Morphine and related; Cephalexin; and Penicillins  Home Medications   Current Outpatient Rx  Name Route Sig Dispense Refill  . ALBUTEROL SULFATE HFA 108 (90 BASE) MCG/ACT IN AERS Inhalation Inhale 2 puffs into the lungs every 4 (four) hours as needed for wheezing. 1 Inhaler 0  . HYDROCODONE-ACETAMINOPHEN 7.5-325 MG PO TABS Oral Take 1 tablet by mouth every 6 (six) hours as needed. Pain    . LISINOPRIL-HYDROCHLOROTHIAZIDE 20-12.5 MG PO TABS Oral Take 1 tablet by mouth daily.    Marland Kitchen LORAZEPAM 1 MG PO TABS Oral Take 0.5 tablets (0.5 mg total) by mouth 3 (three) times daily as needed for anxiety. 15 tablet 0  . MECLIZINE HCL 25 MG PO TABS Oral Take 1 tablet (25 mg total) by mouth every 6 (six) hours as needed for dizziness. 15 tablet 0    BP  144/94  Pulse 74  Temp(Src) 98.9 F (37.2 C) (Oral)  Resp 18  SpO2 100%  Physical Exam  Nursing note and vitals reviewed. Constitutional: She is oriented to person, place, and time. She appears well-developed and well-nourished.  HENT:  Head: Normocephalic and atraumatic.  Eyes: Conjunctivae and EOM are normal. Pupils are equal, round, and reactive to light.  Neck: Normal range of motion. Neck supple.  Cardiovascular: Normal rate and regular rhythm.   Pulmonary/Chest: Effort normal and breath sounds normal.  Abdominal: Soft. Bowel sounds are normal.  Musculoskeletal: Normal range of motion.  Neurological: She is alert and oriented to person, place, and time.  Skin: Skin is warm and dry.  Psychiatric: She has a normal mood and affect.    ED Course  Procedures (including critical care time)  Labs Reviewed  GLUCOSE, CAPILLARY - Abnormal; Notable for the following:    Glucose-Capillary 110 (*)    All other components within normal limits   No results found.   1. Vertigo     Date: 10/26/2011  Rate: 71  Rhythm: normal sinus rhythm  QRS Axis: normal  Intervals: normal  ST/T Wave abnormalities: normal  Conduction Disutrbances:none  Narrative Interpretation:   Old EKG Reviewed: changes noted 1st degree av block   MDM  Patient has normal neural exam. She is alert and oriented x3. Full range of motion of all extremities. No slurred speech.  I suspect this is vertigo. Will Rx Antivert and Ativan         Donnetta Hutching, MD 10/26/11 1232  Donnetta Hutching, MD 10/26/11 1254

## 2012-01-29 ENCOUNTER — Encounter (HOSPITAL_COMMUNITY): Payer: Self-pay | Admitting: Respiratory Therapy

## 2012-02-06 ENCOUNTER — Other Ambulatory Visit: Payer: Self-pay | Admitting: Orthopedic Surgery

## 2012-02-06 ENCOUNTER — Ambulatory Visit (HOSPITAL_COMMUNITY)
Admission: RE | Admit: 2012-02-06 | Discharge: 2012-02-06 | Disposition: A | Discharge status: Home / Self Care | Payer: Medicare Other | Source: Ambulatory Visit | Attending: Orthopedic Surgery | Admitting: Orthopedic Surgery

## 2012-02-06 ENCOUNTER — Encounter (HOSPITAL_COMMUNITY): Payer: Self-pay

## 2012-02-06 ENCOUNTER — Encounter (HOSPITAL_COMMUNITY)
Admission: RE | Admit: 2012-02-06 | Discharge: 2012-02-06 | Disposition: A | Discharge status: Home / Self Care | Payer: Medicare Other | Source: Ambulatory Visit | Attending: Orthopedic Surgery | Admitting: Orthopedic Surgery

## 2012-02-06 DIAGNOSIS — Z01812 Encounter for preprocedural laboratory examination: Secondary | ICD-10-CM | POA: Insufficient documentation

## 2012-02-06 DIAGNOSIS — Z01818 Encounter for other preprocedural examination: Secondary | ICD-10-CM | POA: Insufficient documentation

## 2012-02-06 HISTORY — DX: Calculus of kidney: N20.0

## 2012-02-06 HISTORY — DX: Unspecified asthma, uncomplicated: J45.909

## 2012-02-06 HISTORY — DX: Adverse effect of unspecified anesthetic, initial encounter: T41.45XA

## 2012-02-06 HISTORY — DX: Sleep apnea, unspecified: G47.30

## 2012-02-06 HISTORY — DX: Headache: R51

## 2012-02-06 HISTORY — DX: Other complications of anesthesia, initial encounter: T88.59XA

## 2012-02-06 HISTORY — DX: Fibromyalgia: M79.7

## 2012-02-06 LAB — COMPREHENSIVE METABOLIC PANEL
ALT: 12 U/L (ref 0–35)
AST: 16 U/L (ref 0–37)
Alkaline Phosphatase: 65 U/L (ref 39–117)
CO2: 30 mEq/L (ref 19–32)
Chloride: 99 mEq/L (ref 96–112)
GFR calc Af Amer: 56 mL/min — ABNORMAL LOW (ref >90)
GFR calc non Af Amer: 48 mL/min — ABNORMAL LOW (ref >90)
Glucose, Bld: 98 mg/dL (ref 70–99)
Potassium: 4.1 mEq/L (ref 3.5–5.1)
Sodium: 139 mEq/L (ref 135–145)
Total Bilirubin: 0.4 mg/dL (ref 0.3–1.2)

## 2012-02-06 LAB — CBC
HCT: 39.9 % (ref 36.0–46.0)
Hemoglobin: 13.4 g/dL (ref 12.0–15.0)
MCH: 31.3 pg (ref 26.0–34.0)
MCV: 93.2 fL (ref 78.0–100.0)
RBC: 4.28 MIL/uL (ref 3.87–5.11)
WBC: 6 10*3/uL (ref 4.0–10.5)

## 2012-02-06 LAB — URINALYSIS, ROUTINE W REFLEX MICROSCOPIC
Glucose, UA: NEGATIVE mg/dL
Hgb urine dipstick: NEGATIVE
Ketones, ur: NEGATIVE mg/dL
Leukocytes, UA: NEGATIVE
Protein, ur: NEGATIVE mg/dL
pH: 5 (ref 5.0–8.0)

## 2012-02-06 LAB — SURGICAL PCR SCREEN
MRSA, PCR: NEGATIVE
Staphylococcus aureus: NEGATIVE

## 2012-02-06 LAB — TYPE AND SCREEN

## 2012-02-06 NOTE — Progress Notes (Signed)
OFFICE WAS CALLED FOR PAT ORDERS.

## 2012-02-06 NOTE — Pre-Procedure Instructions (Signed)
20 Jean Aguilar  02/06/2012   Your procedure is scheduled on: Thursday 02/20/12  Report to [facility] at 530 AM.  Call this number if you have problems the morning of surgery: [phone]   Remember:   Do not eat food OR DRINK :After Midnight.    Take these medicines the morning of surgery with A SIP OF WATER: ALBUTEROL INHALER , SYMBICORT, ESTRACE, HYDROCODONE IF NEEDED    Do not wear jewelry, make-up or nail polish.  Do not wear lotions, powders, or perfumes. You may wear deodorant.  Do not shave 48 hours prior to surgery. Men may shave face and neck.  Do not bring valuables to the hospital.  Contacts, dentures or bridgework may not be worn into surgery.  Leave suitcase in the car. After surgery it may be brought to your room.  For patients admitted to the hospital, checkout time is 11:00 AM the day of discharge.   Patients discharged the day of surgery will not be allowed to drive home.  Name and phone number of your driver:   Special Instructions: CHG Shower Use Special Wash: 1/2 bottle night before surgery and 1/2 bottle morning of surgery.   Please read over the following fact sheets that you were given: Pain Booklet, Coughing and Deep Breathing, Blood Transfusion Information, Total Joint Packet, MRSA Information and Surgical Site Infection Prevention

## 2012-02-06 NOTE — Progress Notes (Addendum)
Medical MD IS DR Woodward Ku .  [facility] [address]  [phone]

## 2012-02-06 NOTE — Progress Notes (Addendum)
NOTIFIED DR Krista Blue OF PATIENT STATING THAT SHE WOKE UP DURING KNEE ARTHROSCOPY 15 YEARS AGO, HAS DONE FINE WITH OTHER SURGERIES SINCE.  NO ORDERS GIVEN AT THIS TIME.

## 2012-02-06 NOTE — Progress Notes (Signed)
LEFT ELBOW WITH FLUID, WAS RECENTLY DRAINED BY DR Montez Morita.

## 2012-02-07 ENCOUNTER — Other Ambulatory Visit: Payer: Self-pay | Admitting: Orthopedic Surgery

## 2012-02-07 NOTE — Progress Notes (Signed)
Spoke with Jewel at Dr Assunta Found office... She will give him another reminder note regarding our needing orders for this patient.

## 2012-02-19 MED ORDER — VANCOMYCIN HCL IN DEXTROSE 1-5 GM/200ML-% IV SOLN
1000.0000 mg | Freq: Once | INTRAVENOUS | Status: AC
  Administered 2012-02-20: 1000 mg via INTRAVENOUS
  Filled 2012-02-19 (×2): qty 200

## 2012-02-20 ENCOUNTER — Inpatient Hospital Stay (HOSPITAL_COMMUNITY)
Admission: RE | Admit: 2012-02-20 | Discharge: 2012-02-24 | DRG: 470 | Disposition: A | Discharge status: Home Health | Payer: Medicare Other | Source: Ambulatory Visit | Attending: Orthopedic Surgery | Admitting: Orthopedic Surgery

## 2012-02-20 ENCOUNTER — Encounter (HOSPITAL_COMMUNITY): Payer: Self-pay | Admitting: Anesthesiology

## 2012-02-20 ENCOUNTER — Encounter (HOSPITAL_COMMUNITY): Payer: Self-pay | Admitting: *Deleted

## 2012-02-20 ENCOUNTER — Inpatient Hospital Stay (HOSPITAL_COMMUNITY): Payer: Medicare Other | Admitting: Anesthesiology

## 2012-02-20 ENCOUNTER — Encounter (HOSPITAL_COMMUNITY): Payer: Self-pay | Admitting: General Practice

## 2012-02-20 ENCOUNTER — Encounter (HOSPITAL_COMMUNITY)
Admission: RE | Disposition: A | Discharge status: Home Health | Payer: Self-pay | Source: Ambulatory Visit | Attending: Orthopedic Surgery

## 2012-02-20 DIAGNOSIS — G8918 Other acute postprocedural pain: Secondary | ICD-10-CM

## 2012-02-20 DIAGNOSIS — Z79899 Other long term (current) drug therapy: Secondary | ICD-10-CM

## 2012-02-20 DIAGNOSIS — Z887 Allergy status to serum and vaccine status: Secondary | ICD-10-CM

## 2012-02-20 DIAGNOSIS — K219 Gastro-esophageal reflux disease without esophagitis: Secondary | ICD-10-CM | POA: Diagnosis present

## 2012-02-20 DIAGNOSIS — Z8249 Family history of ischemic heart disease and other diseases of the circulatory system: Secondary | ICD-10-CM

## 2012-02-20 DIAGNOSIS — IMO0001 Reserved for inherently not codable concepts without codable children: Secondary | ICD-10-CM | POA: Diagnosis present

## 2012-02-20 DIAGNOSIS — J45909 Unspecified asthma, uncomplicated: Secondary | ICD-10-CM | POA: Diagnosis present

## 2012-02-20 DIAGNOSIS — Z7901 Long term (current) use of anticoagulants: Secondary | ICD-10-CM

## 2012-02-20 DIAGNOSIS — G4730 Sleep apnea, unspecified: Secondary | ICD-10-CM | POA: Diagnosis present

## 2012-02-20 DIAGNOSIS — M1710 Unilateral primary osteoarthritis, unspecified knee: Principal | ICD-10-CM | POA: Diagnosis present

## 2012-02-20 DIAGNOSIS — Z880 Allergy status to penicillin: Secondary | ICD-10-CM

## 2012-02-20 DIAGNOSIS — Z87442 Personal history of urinary calculi: Secondary | ICD-10-CM

## 2012-02-20 DIAGNOSIS — F17200 Nicotine dependence, unspecified, uncomplicated: Secondary | ICD-10-CM | POA: Diagnosis present

## 2012-02-20 DIAGNOSIS — E039 Hypothyroidism, unspecified: Secondary | ICD-10-CM | POA: Diagnosis present

## 2012-02-20 DIAGNOSIS — I10 Essential (primary) hypertension: Secondary | ICD-10-CM | POA: Diagnosis present

## 2012-02-20 DIAGNOSIS — E119 Type 2 diabetes mellitus without complications: Secondary | ICD-10-CM | POA: Diagnosis present

## 2012-02-20 DIAGNOSIS — E7800 Pure hypercholesterolemia, unspecified: Secondary | ICD-10-CM | POA: Diagnosis present

## 2012-02-20 HISTORY — DX: Pure hypercholesterolemia, unspecified: E78.00

## 2012-02-20 HISTORY — DX: Gastro-esophageal reflux disease without esophagitis: K21.9

## 2012-02-20 HISTORY — DX: Type 2 diabetes mellitus without complications: E11.9

## 2012-02-20 HISTORY — PX: TOTAL KNEE ARTHROPLASTY: SHX125

## 2012-02-20 HISTORY — DX: Unspecified chronic bronchitis: J42

## 2012-02-20 HISTORY — DX: Hypothyroidism, unspecified: E03.9

## 2012-02-20 HISTORY — DX: Lactose intolerance, unspecified: E73.9

## 2012-02-20 LAB — GLUCOSE, CAPILLARY
Glucose-Capillary: 95 mg/dL (ref 70–99)
Glucose-Capillary: 114 mg/dL — ABNORMAL HIGH (ref 70–99)
Glucose-Capillary: 240 mg/dL — ABNORMAL HIGH (ref 70–99)

## 2012-02-20 SURGERY — ARTHROPLASTY, KNEE, TOTAL
Anesthesia: General | Site: Knee | Laterality: Left | Wound class: Clean

## 2012-02-20 MED ORDER — DIPHENHYDRAMINE HCL 50 MG/ML IJ SOLN: 12.5000 mg | Freq: Four times a day (QID) | INTRAMUSCULAR | Status: DC | PRN

## 2012-02-20 MED ORDER — BUDESONIDE-FORMOTEROL FUMARATE 160-4.5 MCG/ACT IN AERO
2.0000 | INHALATION_SPRAY | Freq: Two times a day (BID) | RESPIRATORY_TRACT | Status: DC
  Administered 2012-02-20 – 2012-02-24 (×8): 2 via RESPIRATORY_TRACT
  Filled 2012-02-20: qty 6

## 2012-02-20 MED ORDER — DIPHENHYDRAMINE HCL 12.5 MG/5ML PO ELIX: 12.5000 mg | ORAL_SOLUTION | Freq: Four times a day (QID) | ORAL | Status: DC | PRN

## 2012-02-20 MED ORDER — MIDAZOLAM HCL 2 MG/2ML IJ SOLN: 1.0000 mg | INTRAMUSCULAR | Status: DC | PRN

## 2012-02-20 MED ORDER — FUROSEMIDE 20 MG PO TABS
20.0000 mg | ORAL_TABLET | Freq: Every day | ORAL | Status: DC | PRN
  Filled 2012-02-20: qty 1

## 2012-02-20 MED ORDER — METHOCARBAMOL 100 MG/ML IJ SOLN
500.0000 mg | Freq: Four times a day (QID) | INTRAVENOUS | Status: DC | PRN
  Administered 2012-02-20: 500 mg via INTRAVENOUS
  Filled 2012-02-20 (×2): qty 5

## 2012-02-20 MED ORDER — FENTANYL CITRATE 0.05 MG/ML IJ SOLN: 50.0000 ug | Freq: Once | INTRAMUSCULAR | Status: DC

## 2012-02-20 MED ORDER — NALOXONE HCL 0.4 MG/ML IJ SOLN: 0.4000 mg | INTRAMUSCULAR | Status: DC | PRN

## 2012-02-20 MED ORDER — PHENOL 1.4 % MT LIQD: 1.0000 | OROMUCOSAL | Status: DC | PRN

## 2012-02-20 MED ORDER — METHOCARBAMOL 500 MG PO TABS
500.0000 mg | ORAL_TABLET | Freq: Four times a day (QID) | ORAL | Status: DC | PRN
  Administered 2012-02-21 – 2012-02-24 (×7): 500 mg via ORAL
  Filled 2012-02-20 (×8): qty 1

## 2012-02-20 MED ORDER — WARFARIN VIDEO: Freq: Once | Status: DC

## 2012-02-20 MED ORDER — HYDROMORPHONE HCL PF 1 MG/ML IJ SOLN
0.2500 mg | INTRAMUSCULAR | Status: DC | PRN
  Administered 2012-02-20 (×4): 0.5 mg via INTRAVENOUS

## 2012-02-20 MED ORDER — PROPOFOL 10 MG/ML IV BOLUS
INTRAVENOUS | Status: DC | PRN
  Administered 2012-02-20: 180 mg via INTRAVENOUS

## 2012-02-20 MED ORDER — HYDROMORPHONE HCL PF 1 MG/ML IJ SOLN
INTRAMUSCULAR | Status: AC
  Filled 2012-02-20: qty 1

## 2012-02-20 MED ORDER — CEFAZOLIN SODIUM-DEXTROSE 2-3 GM-% IV SOLR: 2.0000 g | Freq: Four times a day (QID) | INTRAVENOUS | Status: DC

## 2012-02-20 MED ORDER — VANCOMYCIN HCL IN DEXTROSE 1-5 GM/200ML-% IV SOLN
1000.0000 mg | Freq: Two times a day (BID) | INTRAVENOUS | Status: AC
  Administered 2012-02-20: 1000 mg via INTRAVENOUS
  Filled 2012-02-20: qty 200

## 2012-02-20 MED ORDER — COUMADIN BOOK
Freq: Once | Status: AC
  Administered 2012-02-20: 14:00:00
  Filled 2012-02-20: qty 1

## 2012-02-20 MED ORDER — FENTANYL CITRATE 0.05 MG/ML IJ SOLN
INTRAMUSCULAR | Status: DC | PRN
  Administered 2012-02-20: 50 ug via INTRAVENOUS
  Administered 2012-02-20: 100 ug via INTRAVENOUS

## 2012-02-20 MED ORDER — POTASSIUM CHLORIDE IN NACL 20-0.45 MEQ/L-% IV SOLN
INTRAVENOUS | Status: DC
  Administered 2012-02-20 – 2012-02-21 (×2): via INTRAVENOUS
  Filled 2012-02-20 (×10): qty 1000

## 2012-02-20 MED ORDER — ALBUTEROL SULFATE HFA 108 (90 BASE) MCG/ACT IN AERS
2.0000 | INHALATION_SPRAY | RESPIRATORY_TRACT | Status: DC | PRN
  Filled 2012-02-20: qty 6.7

## 2012-02-20 MED ORDER — BUPIVACAINE-EPINEPHRINE PF 0.5-1:200000 % IJ SOLN
INTRAMUSCULAR | Status: DC | PRN
  Administered 2012-02-20: 25 mL

## 2012-02-20 MED ORDER — ONDANSETRON HCL 4 MG/2ML IJ SOLN: 4.0000 mg | Freq: Four times a day (QID) | INTRAMUSCULAR | Status: DC | PRN

## 2012-02-20 MED ORDER — ATORVASTATIN CALCIUM 10 MG PO TABS
10.0000 mg | ORAL_TABLET | Freq: Every day | ORAL | Status: DC
  Administered 2012-02-20 – 2012-02-23 (×4): 10 mg via ORAL
  Filled 2012-02-20 (×5): qty 1

## 2012-02-20 MED ORDER — METOCLOPRAMIDE HCL 10 MG PO TABS
5.0000 mg | ORAL_TABLET | Freq: Three times a day (TID) | ORAL | Status: DC | PRN
  Administered 2012-02-23: 10 mg via ORAL
  Filled 2012-02-20: qty 1

## 2012-02-20 MED ORDER — METOCLOPRAMIDE HCL 5 MG/ML IJ SOLN: 5.0000 mg | Freq: Three times a day (TID) | INTRAMUSCULAR | Status: DC | PRN

## 2012-02-20 MED ORDER — MENTHOL 3 MG MT LOZG: 1.0000 | LOZENGE | OROMUCOSAL | Status: DC | PRN

## 2012-02-20 MED ORDER — FERROUS SULFATE 325 (65 FE) MG PO TABS
325.0000 mg | ORAL_TABLET | Freq: Three times a day (TID) | ORAL | Status: DC
  Administered 2012-02-20 – 2012-02-24 (×9): 325 mg via ORAL
  Filled 2012-02-20 (×14): qty 1

## 2012-02-20 MED ORDER — HYDROMORPHONE 0.3 MG/ML IV SOLN
INTRAVENOUS | Status: DC
  Administered 2012-02-20: 0.599 mg via INTRAVENOUS
  Administered 2012-02-20: 1.19 mg via INTRAVENOUS
  Administered 2012-02-20: 11:00:00 via INTRAVENOUS
  Administered 2012-02-21: 1.119 mg via INTRAVENOUS
  Administered 2012-02-21: 1.99 mg via INTRAVENOUS
  Administered 2012-02-21: 0.2 mg via INTRAVENOUS
  Administered 2012-02-21: 3.19 mg via INTRAVENOUS
  Administered 2012-02-21: 7.5 mg via INTRAVENOUS
  Administered 2012-02-21: 1.39 mg via INTRAVENOUS
  Administered 2012-02-22: 0.2 mg via INTRAVENOUS
  Administered 2012-02-22: 0.59 mg via INTRAVENOUS
  Administered 2012-02-22: 3.59 mg via INTRAVENOUS
  Filled 2012-02-20 (×2): qty 25

## 2012-02-20 MED ORDER — HYDROCHLOROTHIAZIDE 12.5 MG PO CAPS
12.5000 mg | ORAL_CAPSULE | Freq: Every day | ORAL | Status: DC
  Administered 2012-02-20 – 2012-02-24 (×5): 12.5 mg via ORAL
  Filled 2012-02-20 (×5): qty 1

## 2012-02-20 MED ORDER — DEXAMETHASONE SODIUM PHOSPHATE 4 MG/ML IJ SOLN
INTRAMUSCULAR | Status: DC | PRN
  Administered 2012-02-20: 4 mg

## 2012-02-20 MED ORDER — LACTATED RINGERS IV SOLN
INTRAVENOUS | Status: DC | PRN
  Administered 2012-02-20 (×2): via INTRAVENOUS

## 2012-02-20 MED ORDER — LISINOPRIL-HYDROCHLOROTHIAZIDE 20-12.5 MG PO TABS: 1.0000 | ORAL_TABLET | Freq: Every day | ORAL | Status: DC

## 2012-02-20 MED ORDER — WARFARIN SODIUM 5 MG PO TABS
5.0000 mg | ORAL_TABLET | Freq: Once | ORAL | Status: AC
  Administered 2012-02-20: 5 mg via ORAL
  Filled 2012-02-20: qty 1

## 2012-02-20 MED ORDER — ACETAMINOPHEN 650 MG RE SUPP: 650.0000 mg | Freq: Four times a day (QID) | RECTAL | Status: DC | PRN

## 2012-02-20 MED ORDER — WARFARIN - PHARMACIST DOSING INPATIENT: Freq: Every day | Status: DC

## 2012-02-20 MED ORDER — ESTRADIOL 0.1 MG/GM VA CREA
2.0000 g | TOPICAL_CREAM | Freq: Every day | VAGINAL | Status: DC | PRN
  Filled 2012-02-20: qty 42.5

## 2012-02-20 MED ORDER — ACETAMINOPHEN 325 MG PO TABS
650.0000 mg | ORAL_TABLET | Freq: Four times a day (QID) | ORAL | Status: DC | PRN
  Administered 2012-02-23: 650 mg via ORAL
  Filled 2012-02-20: qty 2

## 2012-02-20 MED ORDER — MIDAZOLAM HCL 5 MG/5ML IJ SOLN
INTRAMUSCULAR | Status: DC | PRN
  Administered 2012-02-20: 2 mg via INTRAVENOUS

## 2012-02-20 MED ORDER — LISINOPRIL 20 MG PO TABS
20.0000 mg | ORAL_TABLET | Freq: Every day | ORAL | Status: DC
  Administered 2012-02-20 – 2012-02-24 (×5): 20 mg via ORAL
  Filled 2012-02-20 (×5): qty 1

## 2012-02-20 MED ORDER — ROCURONIUM BROMIDE 100 MG/10ML IV SOLN
INTRAVENOUS | Status: DC | PRN
  Administered 2012-02-20: 50 mg via INTRAVENOUS

## 2012-02-20 MED ORDER — HYDROMORPHONE 0.3 MG/ML IV SOLN
INTRAVENOUS | Status: AC
  Filled 2012-02-20: qty 25

## 2012-02-20 MED ORDER — ALUM & MAG HYDROXIDE-SIMETH 200-200-20 MG/5ML PO SUSP
30.0000 mL | ORAL | Status: DC | PRN
  Administered 2012-02-21: 30 mL via ORAL
  Filled 2012-02-20: qty 30

## 2012-02-20 MED ORDER — LIDOCAINE HCL (CARDIAC) 20 MG/ML IV SOLN
INTRAVENOUS | Status: DC | PRN
  Administered 2012-02-20: 100 mg via INTRAVENOUS

## 2012-02-20 MED ORDER — ACETAMINOPHEN 10 MG/ML IV SOLN
1000.0000 mg | Freq: Four times a day (QID) | INTRAVENOUS | Status: AC
  Administered 2012-02-20 – 2012-02-21 (×4): 1000 mg via INTRAVENOUS
  Filled 2012-02-20 (×4): qty 100

## 2012-02-20 MED ORDER — SODIUM CHLORIDE 0.9 % IJ SOLN: 9.0000 mL | INTRAMUSCULAR | Status: DC | PRN

## 2012-02-20 MED ORDER — SODIUM CHLORIDE 0.9 % IR SOLN
Status: DC | PRN
  Administered 2012-02-20: 3000 mL

## 2012-02-20 MED ORDER — PROMETHAZINE HCL 25 MG/ML IJ SOLN: 6.2500 mg | INTRAMUSCULAR | Status: DC | PRN

## 2012-02-20 MED ORDER — ONDANSETRON HCL 4 MG PO TABS: 4.0000 mg | ORAL_TABLET | Freq: Four times a day (QID) | ORAL | Status: DC | PRN

## 2012-02-20 MED ORDER — OXYCODONE HCL 5 MG PO TABS
5.0000 mg | ORAL_TABLET | ORAL | Status: DC | PRN
  Administered 2012-02-22 – 2012-02-24 (×12): 10 mg via ORAL
  Filled 2012-02-20 (×12): qty 2

## 2012-02-20 MED ORDER — DOCUSATE SODIUM 100 MG PO CAPS
100.0000 mg | ORAL_CAPSULE | Freq: Two times a day (BID) | ORAL | Status: DC
  Administered 2012-02-20 – 2012-02-24 (×9): 100 mg via ORAL
  Filled 2012-02-20 (×11): qty 1

## 2012-02-20 SURGICAL SUPPLY — 53 items
BANDAGE ELASTIC 4 VELCRO ST LF (GAUZE/BANDAGES/DRESSINGS) ×2 IMPLANT
BANDAGE ELASTIC 6 VELCRO ST LF (GAUZE/BANDAGES/DRESSINGS) ×1 IMPLANT
BANDAGE ESMARK 6X9 LF (GAUZE/BANDAGES/DRESSINGS) ×1 IMPLANT
BANDAGE GAUZE ELAST BULKY 4 IN (GAUZE/BANDAGES/DRESSINGS) ×2 IMPLANT
BAR LOCKING TIBIAL (Orthopedic Implant) IMPLANT
BAR ORTH LCK STRL TIB TI VNGRD (Orthopedic Implant) ×1 IMPLANT
BLADE SAGITTAL 25.0X1.27X90 (BLADE) ×2 IMPLANT
BNDG CMPR 9X6 STRL LF SNTH (GAUZE/BANDAGES/DRESSINGS) ×1
BNDG CMPR MED 10X6 ELC LF (GAUZE/BANDAGES/DRESSINGS) ×1
BNDG ELASTIC 6X10 VLCR STRL LF (GAUZE/BANDAGES/DRESSINGS) ×2 IMPLANT
BNDG ESMARK 6X9 LF (GAUZE/BANDAGES/DRESSINGS) ×2
BONE CEMENT PALACOSE (Orthopedic Implant) ×2 IMPLANT
BOWL SMART MIX CTS (DISPOSABLE) ×2 IMPLANT
CEMENT BONE PALACOSE (Orthopedic Implant) ×2 IMPLANT
CLOTH BEACON ORANGE TIMEOUT ST (SAFETY) ×2 IMPLANT
COVER SURGICAL LIGHT HANDLE (MISCELLANEOUS) ×2 IMPLANT
CUFF TOURNIQUET SINGLE 34IN LL (TOURNIQUET CUFF) IMPLANT
CUFF TOURNIQUET SINGLE 44IN (TOURNIQUET CUFF) IMPLANT
DRAPE EXTREMITY T 121X128X90 (DRAPE) ×2 IMPLANT
DRAPE U-SHAPE 47X51 STRL (DRAPES) ×2 IMPLANT
DRSG ADAPTIC 3X8 NADH LF (GAUZE/BANDAGES/DRESSINGS) ×2 IMPLANT
DRSG PAD ABDOMINAL 8X10 ST (GAUZE/BANDAGES/DRESSINGS) ×2 IMPLANT
DURAPREP 26ML APPLICATOR (WOUND CARE) ×2 IMPLANT
ELECT REM PT RETURN 9FT ADLT (ELECTROSURGICAL) ×2
ELECTRODE REM PT RTRN 9FT ADLT (ELECTROSURGICAL) ×1 IMPLANT
EVACUATOR 3/16  PVC DRAIN (DRAIN)
EVACUATOR 3/16 PVC DRAIN (DRAIN) IMPLANT
FACESHIELD LNG OPTICON STERILE (SAFETY) ×2 IMPLANT
GLOVE SS PI 9.0 STRL (GLOVE) ×2 IMPLANT
GOWN PREVENTION PLUS XLARGE (GOWN DISPOSABLE) ×2 IMPLANT
GOWN STRL NON-REIN LRG LVL3 (GOWN DISPOSABLE) ×6 IMPLANT
HANDPIECE INTERPULSE COAX TIP (DISPOSABLE) ×2
IMMOBILIZER KNEE 20 (SOFTGOODS)
IMMOBILIZER KNEE 20 THIGH 36 (SOFTGOODS) IMPLANT
IMMOBILIZER KNEE 22 UNIV (SOFTGOODS) ×2 IMPLANT
IMMOBILIZER KNEE 24 THIGH 36 (MISCELLANEOUS) IMPLANT
IMMOBILIZER KNEE 24 UNIV (MISCELLANEOUS)
KIT BASIN OR (CUSTOM PROCEDURE TRAY) ×2 IMPLANT
KIT ROOM TURNOVER OR (KITS) ×2 IMPLANT
MANIFOLD NEPTUNE II (INSTRUMENTS) ×2 IMPLANT
NS IRRIG 1000ML POUR BTL (IV SOLUTION) ×2 IMPLANT
PACK TOTAL JOINT (CUSTOM PROCEDURE TRAY) ×2 IMPLANT
PAD ARMBOARD 7.5X6 YLW CONV (MISCELLANEOUS) ×4 IMPLANT
SET HNDPC FAN SPRY TIP SCT (DISPOSABLE) ×1 IMPLANT
SPONGE GAUZE 4X4 12PLY (GAUZE/BANDAGES/DRESSINGS) ×2 IMPLANT
STAPLER VISISTAT 35W (STAPLE) ×2 IMPLANT
SUT VIC AB 0 CTB1 27 (SUTURE) ×8 IMPLANT
SUT VIC AB 2-0 CTB1 (SUTURE) ×8 IMPLANT
TIBIAL LOCKING BAR (Orthopedic Implant) ×2 IMPLANT
TOWEL OR 17X24 6PK STRL BLUE (TOWEL DISPOSABLE) ×2 IMPLANT
TOWEL OR 17X26 10 PK STRL BLUE (TOWEL DISPOSABLE) ×2 IMPLANT
TRAY FOLEY CATH 14FR (SET/KITS/TRAYS/PACK) ×2 IMPLANT
WATER STERILE IRR 1000ML POUR (IV SOLUTION) ×6 IMPLANT

## 2012-02-20 NOTE — Anesthesia Procedure Notes (Addendum)
Anesthesia Regional Block:  Femoral nerve block  Pre-Anesthetic Checklist: ,, timeout performed, Correct Patient, Correct Site, Correct Laterality, Correct Procedure, Correct Position, site marked, Risks and benefits discussed,  Surgical consent,  Pre-op evaluation,  At surgeon's request and post-op pain management  Laterality: Left  Prep: chloraprep       Needles:  Injection technique: Single-shot  Needle Type: Stimulator Needle - 80     Needle Length: 8cm  Needle Gauge: 22 and 22 G    Additional Needles:  Procedures: nerve stimulator Femoral nerve block  Nerve Stimulator or Paresthesia:  Response: 0.48 mA,   Additional Responses:   Narrative:  Start time: 02/20/2012 7:22 AM End time: 02/20/2012 7:29 AM Injection made incrementally with aspirations every 5 mL. Anesthesiologist: Dr Gypsy Balsam  Additional Notes: 0722-0729 L FNB POP CHG prep, sterile tech #22 stim needle w/stim down to .48ma Multiple neg asp Marc .5% w/epi 25cc+decadron 4mg  infiltrated No compl Dr Gypsy Balsam   Procedure Name: Intubation Date/Time: 02/20/2012 8:00 AM Performed by: Arlice Colt B Pre-anesthesia Checklist: Emergency Drugs available, Patient identified, Suction available, Patient being monitored and Timeout performed Patient Re-evaluated:Patient Re-evaluated prior to inductionOxygen Delivery Method: Circle system utilized Preoxygenation: Pre-oxygenation with 100% oxygen Intubation Type: IV induction Ventilation: Mask ventilation without difficulty Laryngoscope Size: Mac and 3 Grade View: Grade I Tube type: Oral Tube size: 7.5 mm Number of attempts: 1 Airway Equipment and Method: Stylet Placement Confirmation: ETT inserted through vocal cords under direct vision,  positive ETCO2 and breath sounds checked- equal and bilateral Secured at: 21 cm Tube secured with: Tape Dental Injury: Teeth and Oropharynx as per pre-operative assessment

## 2012-02-20 NOTE — Progress Notes (Signed)
ANTICOAGULATION CONSULT NOTE - Initial Consult  Pharmacy Consult for Coumadin Indication: VTE prophylaxis  Allergies  Allergen Reactions  . Morphine And Related Other (See Comments)    headache  . Tuberculin Tests Other (See Comments)    Reaction at site, using chest x-rays instead  . Cephalexin Rash  . Penicillins Rash    iv    Patient Measurements: Weight: 197 lb 1.5 oz (89.4 kg)  Vital Signs: Temp: 98.8 F (37.1 C) (09/19 1130) Temp src: Oral (09/19 0614) BP: 151/72 mmHg (09/19 1130) Pulse Rate: 73  (09/19 1130)  Labs: No results found for this basename: HGB:2,HCT:3,PLT:3,APTT:3,LABPROT:3,INR:3,HEPARINUNFRC:3,CREATININE:3,CKTOTAL:3,CKMB:3,TROPONINI:3 in the last 72 hours  CrCl is unknown because there is no height on file for the current visit.   Medical History: Past Medical History  Diagnosis Date  . Hypertension   . Diabetes mellitus   . Arthritis   . Complication of anesthesia     WAKE UP DURING KNEE ARTHROSCOPY  . Asthma     HX BRONCHITIS  . Sleep apnea     10 + 12 YRS NO MACHINE  . Kidney stones   . Headache   . Fibromyalgia   . No pertinent past medical history     CYSTS LT KIDNEY+SPINE    Medications:  Prescriptions prior to admission  Medication Sig Dispense Refill  . albuterol (PROVENTIL HFA;VENTOLIN HFA) 108 (90 BASE) MCG/ACT inhaler Inhale 2 puffs into the lungs every 4 (four) hours as needed for wheezing.  1 Inhaler  0  . budesonide-formoterol (SYMBICORT) 160-4.5 MCG/ACT inhaler Inhale 2 puffs into the lungs 2 (two) times daily.      . diclofenac sodium (VOLTAREN) 1 % GEL Apply 2 g topically 4 (four) times daily.      Marland Kitchen estradiol (ESTRACE) 0.1 MG/GM vaginal cream Place 2 g vaginally daily as needed.      . furosemide (LASIX) 20 MG tablet Take 20 mg by mouth daily as needed. For excess fluid      . HYDROcodone-acetaminophen (NORCO) 7.5-325 MG per tablet Take 1 tablet by mouth every 6 (six) hours as needed. Pain      . ibuprofen (ADVIL,MOTRIN)  200 MG tablet Take 600 mg by mouth every 6 (six) hours as needed. For pain      . lisinopril-hydrochlorothiazide (PRINZIDE,ZESTORETIC) 20-12.5 MG per tablet Take 1 tablet by mouth daily.      . rosuvastatin (CRESTOR) 10 MG tablet Take 10 mg by mouth daily.        Assessment: 76 y/o female patient s/p L TKA requiring coumadin for dvt prophylaxis. INR baseline wnl.  Goal of Therapy:  INR 2-3 Monitor platelets by anticoagulation protocol: Yes   Plan:  Coumadin 5mg  today and f/u daily protime.  Verlene Mayer, PharmD, BCPS Pager [phone] 02/20/2012,12:38 PM

## 2012-02-20 NOTE — Transfer of Care (Signed)
Immediate Anesthesia Transfer of Care Note  Patient: Jean Aguilar  Procedure(s) Performed: Procedure(s) (LRB) with comments: TOTAL KNEE ARTHROPLASTY (Left)  Patient Location: PACU  Anesthesia Type: General  Level of Consciousness: awake, alert  and oriented  Airway & Oxygen Therapy: Patient Spontanous Breathing and Patient connected to nasal cannula oxygen  Post-op Assessment: Report given to PACU RN and Post -op Vital signs reviewed and stable  Post vital signs: Reviewed and stable  Complications: No apparent anesthesia complications

## 2012-02-20 NOTE — Progress Notes (Signed)
Orthopedic Tech Progress Note Patient Details:  Jean Aguilar [facility] 1936-03-17 [id number] CPM applied to Left LE with appropriate settings; OHF with trapeze bar applied to patient bed. CPM Left Knee CPM Left Knee: On Left Knee Flexion (Degrees): 60  Left Knee Extension (Degrees): 0    Jean Aguilar 02/20/2012, 11:07 AM

## 2012-02-20 NOTE — Brief Op Note (Signed)
02/20/2012  10:15 AM  PATIENT:  Jean Aguilar  76 y.o. female  PRE-OPERATIVE DIAGNOSIS:  DJD LEFT KNEE  POST-OPERATIVE DIAGNOSIS:  DJD LEFT KNEE  PROCEDURE:  Procedure(s) (LRB) with comments: TOTAL KNEE ARTHROPLASTY (Left)  SURGEON:  Surgeon(s) and Role:    * Kennieth Rad, MD - Primary  PHYSICIAN ASSISTANT:   ASSISTANTS: none   ANESTHESIA:   general  EBL:  Total I/O In: 1000 [I.V.:1000] Out: 200 [Urine:50; Blood:150]  BLOOD ADMINISTERED:none  DRAINS: none   LOCAL MEDICATIONS USED:  NONE  SPECIMEN:  No Specimen  DISPOSITION OF SPECIMEN:  N/A  COUNTS:  YES  TOURNIQUET:   Total Tourniquet Time Documented: Thigh (Left) - 91 minutes  DICTATION: .Other Dictation: Dictation Number report #[id number]  PLAN OF CARE: Admit to inpatient   PATIENT DISPOSITION:  PACU - hemodynamically stable.   Delay start of Pharmacological VTE agent (>24hrs) due to surgical blood loss or risk of bleeding: yes

## 2012-02-20 NOTE — Anesthesia Preprocedure Evaluation (Signed)
Anesthesia Evaluation  Patient identified by MRN, date of birth, ID band Patient awake    Reviewed: Allergy & Precautions, H&P , NPO status , Patient's Chart, lab work & pertinent test results  Airway Mallampati: II TM Distance: >3 FB Neck ROM: Full    Dental   Pulmonary asthma , sleep apnea , Current Smoker,  + rhonchi         Cardiovascular hypertension, Rhythm:Regular Rate:Normal     Neuro/Psych  Headaches,  Neuromuscular disease    GI/Hepatic   Endo/Other  diabetes  Renal/GU      Musculoskeletal  (+) Fibromyalgia -  Abdominal (+) + obese,   Peds  Hematology   Anesthesia Other Findings   Reproductive/Obstetrics                           Anesthesia Physical Anesthesia Plan  ASA: III  Anesthesia Plan: General   Post-op Pain Management:    Induction: Intravenous  Airway Management Planned: Oral ETT  Additional Equipment:   Intra-op Plan:   Post-operative Plan: Extubation in OR  Informed Consent: I have reviewed the patients History and Physical, chart, labs and discussed the procedure including the risks, benefits and alternatives for the proposed anesthesia with the patient or authorized representative who has indicated his/her understanding and acceptance.     Plan Discussed with: CRNA and Surgeon  Anesthesia Plan Comments:         Anesthesia Quick Evaluation

## 2012-02-20 NOTE — Progress Notes (Signed)
Pt. States she has back problems,lower back and had a reaction to the TB skin test and received chest x- ray instead

## 2012-02-20 NOTE — Progress Notes (Signed)
Nutrition Brief Note:  RD paged by RN re: diet order/allergies.  Pt denies a wheat allergy and requests a Regular diet.  A regular diet is ordered for pt without restriction, however interface with [employer] lists a wheat allergy.  RD clarified diet order in [employer] and assisted pt in ordering lunch, dinner, and breakfast.  Pt denies other nutrition-related concerns at this time.  Please page RD if other issues/concern present.  Loyce Dys, MS RD LDN Clinical Inpatient Dietitian Pager: [phone] Weekend/After hours pager: [phone]

## 2012-02-20 NOTE — H&P (Signed)
Jean Aguilar is an 76 y.o. female.   Chief Complaint: PAINFUL LEFT KNEE HPI: THIS IS A 76Y/O FEMALE TREATED FOR DJD OF THE LEFT KNEE  OVER THE PAST FEW YEARS WITH PROGRESSIVE LOSS OF STABILITY, FUNCTION, AND PERSISTING PAIN AT REST AND  ONWT-BEARING.  Past Medical History  Diagnosis Date  . Hypertension   . Diabetes mellitus   . Arthritis   . Complication of anesthesia     WAKE UP DURING KNEE ARTHROSCOPY  . Asthma     HX BRONCHITIS  . Sleep apnea     10 + 12 YRS NO MACHINE  . Kidney stones   . Headache   . Fibromyalgia   . No pertinent past medical history     CYSTS LT KIDNEY+SPINE    Past Surgical History  Procedure Date  . Appendectomy   . Abdominal hysterectomy   . Tonsillectomy   . Mastectomy   . Elbow surgery     X2    . Shoulder arthroscopy     RIGHT  . Knee arthroscopy     BILATERAL     History reviewed. No pertinent family history. Social History:  reports that she has been smoking.  She does not have any smokeless tobacco history on file. She reports that she does not drink alcohol or use illicit drugs.  Allergies:  Allergies  Allergen Reactions  . Morphine And Related Other (See Comments)    headache  . Tuberculin Tests Other (See Comments)    Reaction at site, using chest x-rays instead  . Cephalexin Rash  . Penicillins Rash    iv    Medications Prior to Admission  Medication Sig Dispense Refill  . albuterol (PROVENTIL HFA;VENTOLIN HFA) 108 (90 BASE) MCG/ACT inhaler Inhale 2 puffs into the lungs every 4 (four) hours as needed for wheezing.  1 Inhaler  0  . budesonide-formoterol (SYMBICORT) 160-4.5 MCG/ACT inhaler Inhale 2 puffs into the lungs 2 (two) times daily.      . diclofenac sodium (VOLTAREN) 1 % GEL Apply 2 g topically 4 (four) times daily.      Marland Kitchen estradiol (ESTRACE) 0.1 MG/GM vaginal cream Place 2 g vaginally daily as needed.      . furosemide (LASIX) 20 MG tablet Take 20 mg by mouth daily as needed. For excess fluid      .  HYDROcodone-acetaminophen (NORCO) 7.5-325 MG per tablet Take 1 tablet by mouth every 6 (six) hours as needed. Pain      . ibuprofen (ADVIL,MOTRIN) 200 MG tablet Take 600 mg by mouth every 6 (six) hours as needed. For pain      . lisinopril-hydrochlorothiazide (PRINZIDE,ZESTORETIC) 20-12.5 MG per tablet Take 1 tablet by mouth daily.      . rosuvastatin (CRESTOR) 10 MG tablet Take 10 mg by mouth daily.        Results for orders placed during the hospital encounter of 02/20/12 (from the past 48 hour(s))  GLUCOSE, CAPILLARY     Status: Normal   Collection Time   02/20/12  6:19 AM      Component Value Range Comment   Glucose-Capillary 95  70 - 99 mg/dL    Comment 1 Notify RN      No results found.  Review of Systems  Constitutional: Negative.   HENT: Negative.   Eyes: Negative.   Respiratory: Negative.   Cardiovascular: Negative.   Gastrointestinal: Negative.   Genitourinary: Negative.   Musculoskeletal: Positive for joint pain.  Skin: Negative.   Neurological: Negative.  Endo/Heme/Allergies: Negative.   Psychiatric/Behavioral: Negative.     Blood pressure 161/77, pulse 73, temperature 98.3 F (36.8 C), temperature source Oral, resp. rate 20, SpO2 100.00%. Physical Exam LEFT KNEE WITH GENU VARUM, CREPITUS BOTH MEDIAL AND LATERAL COMPARTMENTS, PLUS 2 EFFUSION  AND LIMITED ROM.X-RAY REVEALS LOSS OF JOINT SPACE, AND SCLEROSIS  Assessment/PlanDJD LEFT KNEE/ PLAN LEFT TKA   Becci Batty F 02/20/2012, 7:36 AM

## 2012-02-20 NOTE — Progress Notes (Signed)
UR COMPLETED  

## 2012-02-21 ENCOUNTER — Encounter (HOSPITAL_COMMUNITY): Payer: Self-pay | Admitting: Orthopedic Surgery

## 2012-02-21 LAB — CBC
MCH: 31 pg (ref 26.0–34.0)
MCV: 92.2 fL (ref 78.0–100.0)
Platelets: 211 10*3/uL (ref 150–400)
RDW: 12.7 % (ref 11.5–15.5)
WBC: 8.8 10*3/uL (ref 4.0–10.5)

## 2012-02-21 LAB — PROTIME-INR: Prothrombin Time: 13.2 seconds (ref 11.6–15.2)

## 2012-02-21 MED ORDER — WARFARIN SODIUM 5 MG PO TABS
5.0000 mg | ORAL_TABLET | Freq: Once | ORAL | Status: AC
  Administered 2012-02-21: 5 mg via ORAL
  Filled 2012-02-21: qty 1

## 2012-02-21 NOTE — Progress Notes (Signed)
Agree with PT treatment note.  Lula Michaux, PT DPT [phone]  

## 2012-02-21 NOTE — Op Note (Signed)
NAMEMarland Kitchen  Jean Aguilar, Jean Aguilar NO.:  [id number]  MEDICAL RECORD NO.:  [id number]  LOCATION:  5N21C                        FACILITY:  [facility]  PHYSICIAN:  Myrtie Neither, MD      DATE OF BIRTH:  August 16, 1935  DATE OF PROCEDURE:  02/20/2012 DATE OF DISCHARGE:                              OPERATIVE REPORT   PREOPERATIVE DIAGNOSIS:  Degenerative joint disease, left knee.  POSTOPERATIVE DIAGNOSIS:  Degenerative joint disease, left knee.  PROCEDURE:  Left total knee arthroplasty, Biomet implant.  ANESTHESIA:  General.  PROCEDURE:  The patient was taken to the operating room after given the adequate preop medications, given the general anesthesia and intubated. Left knee was prepped with DuraPrep and draped in sterile manner. Tourniquet and Bovie were used for hemostasis.  Anterior midline incision was made from the tibial tuberosity up to the quadriceps.  Soft tissue dissection was made medially and laterally.  A medial paramedian incision was made into the capsule extending from the tibial tuberosity up to the quadriceps.  Patella was reflected laterally osteophytes about the patella.  Femur and tibia were resected followed by soft tissue resection and medial soft tissue release due to varus deformity.  After adequate soft tissue release and resection, tibial cutting jig was put in place with 10 mm of proximal tibial cut.  Reaming down the femoral canal was then done followed by distal femoral cutting jig.  Next, sizing of the femur was found to be 70 mm.  Appropriate cutting jig was then put in place, and anterior, posterior, and chamfering cuts were made.  All the osteophytes and soft tissue resection was then done. Trial femoral component was put in place and was found to fit very snugly.  Tibial sizing was that of 71 mm.  Appropriate cutting jig was put in place and appropriate proximal tibial cut was made.  Tibial and femoral components were put in place.  Range of motion  was tested, full flexion.  There was still some tightness in extension.  Further soft tissue release medially allowed full extension and full flexion with good medial and lateral stability.  Next, attention was turned to the patella, which was size of 34 mm.  Appropriate cutting jig single pole was put in place and appropriate cut was made.  With all three components put in place, range of motion was found to be full extension, full flexion, good medial and lateral stability and no subluxing of the patella with good tracking.  All components were then removed. Irrigation was then done with Simpulse irrigator.  Methyl methacrylate was mixed.  The tibial and patellar components were cemented.  Femoral component was press fit.  The excess methyl methacrylate was removed after setting of the cement.  Final tibial 10-mm bearing was then put in place and again full extension, full flexion, good medial and lateral stability.  The final tibial bearing was then locked in place with a key.  Tourniquet was let down. Copious irrigation was done.  Hemostasis was obtained.  Soft tissue was closed then with 0 Vicryl for the fascia, 2-0 for the subcutaneous and skin staples for the skin.  Bulky compressive dressing was applied and knee immobilizer applied.  The  patient tolerated the procedure well and went to the recovery room in stable and satisfactory condition.     Myrtie Neither, MD     AC/MEDQ  D:  02/20/2012  T:  02/21/2012  Job:  [id number]

## 2012-02-21 NOTE — Progress Notes (Signed)
Inpatient Diabetes Program Recommendations  AACE/ADA: New Consensus Statement on Inpatient Glycemic Control (2013)  Target Ranges:  Prepandial:   less than 140 mg/dL      Peak postprandial:   less than 180 mg/dL (1-2 hours)      Critically ill patients:  140 - 180 mg/dL   Results for LILLYN, WIECZOREK (MRN [id number]) as of 02/21/2012 10:18  Ref. Range 02/20/2012 06:19 02/20/2012 10:53 02/20/2012 16:58 02/20/2012 22:11  Glucose-Capillary Latest Range: 70-99 mg/dL 95 782 (H) 956 (H) 213 (H)    Inpatient Diabetes Program Recommendations Correction (SSI): Please add Novolog Sensitive correction scale (SSI) tid ac + HS.  Note: Will follow. Ambrose Finland RN, MSN, CDE Diabetes Coordinator Inpatient Diabetes Program [phone]

## 2012-02-21 NOTE — Progress Notes (Signed)
Physical Therapy Progress Note   02/21/12 1500  PT Visit Information  Last PT Received On 02/21/12  Assistance Needed +1  PT Time Calculation  PT Start Time 0221  PT Stop Time 0255  PT Time Calculation (min) 34 min  Subjective Data  Subjective Feeling tired and want to get back into bed  Patient Stated Goal To go home  Precautions  Precautions Knee  Required Braces or Orthoses Knee Immobilizer - Left  Knee Immobilizer - Left On except when in CPM  Restrictions  Weight Bearing Restrictions Yes  LLE Weight Bearing PWB  LLE Partial Weight Bearing Percentage or Pounds 50  Cognition  Overall Cognitive Status Appears within functional limits for tasks assessed/performed  Arousal/Alertness Awake/alert  Orientation Level Appears intact for tasks assessed  Behavior During Session [facility] for tasks performed  Bed Mobility  Bed Mobility Sit to Supine;Scooting to HOB  Sit to Supine 4: Min guard  Scooting to [facility] 4: Min guard  Details for Bed Mobility Assistance Cues for proper technique and hand placement  Transfers  Transfers Sit to Stand;Stand to Sit  Sit to Stand 4: Min guard;From chair/3-in-1;With upper extremity assist  Stand to Sit 4: Min guard;With upper extremity assist;To bed  Details for Transfer Assistance Cues for hand placement and safety  Ambulation/Gait  Ambulation/Gait Assistance 4: Min guard  Ambulation Distance (Feet) 5 Feet  Assistive device Rolling walker  Ambulation/Gait Assistance Details Cues for proper technique and safety  Gait Pattern Step-to pattern;Decreased stride length;Antalgic  Gait velocity decreased  Stairs No  Exercises  Exercises Total Joint  Total Joint Exercises  Ankle Circles/Pumps AROM;Both;10 reps  Quad Sets AROM;Left;10 reps  Heel Slides AAROM;Left;10 reps  PT - End of Session  Equipment Utilized During Treatment Gait belt  Activity Tolerance Patient tolerated treatment well  Patient left in bed;with family/visitor present;with call  bell/phone within reach  Nurse Communication Mobility status  PT - Assessment/Plan  Comments on Treatment Session Pt was in pain and wanted to return to bed.  Pt was impulsive and tried to stand without supervision or correct RW placement.  Pt educated on importance of RW placement and waiting for assistance.  Pt CPM was applied but then was removed due to equipment error.  Ortho tech was contacted. Plan for next session is to increase HEP and distance/quality of ambulation.  PT Plan Discharge plan remains appropriate;Frequency remains appropriate  PT Frequency 7X/week  Recommendations for Other Services Other (comment) (None)  Follow Up Recommendations Home health PT  Equipment Recommended Rolling walker with 5" wheels  Acute Rehab PT Goals  PT Goal Formulation With patient  Time For Goal Achievement 02/28/12  Potential to Achieve Goals Good  Pt will go Sit to Supine/Side with modified independence;with HOB 0 degrees  PT Goal: Sit to Supine/Side - Progress Progressing toward goal  Pt will go Sit to Stand with modified independence  PT Goal: Sit to Stand - Progress Progressing toward goal  Pt will go Stand to Sit with modified independence  PT Goal: Stand to Sit - Progress Progressing toward goal  Pt will Ambulate >150 feet  PT Goal: Ambulate - Progress Progressing toward goal  Pt will Perform Home Exercise Program Independently  PT Goal: Perform Home Exercise Program - Progress Progressing toward goal    Pt reports having increase pain in L LE.

## 2012-02-21 NOTE — H&P (Signed)
NAMEMarland Kitchen  Jean Aguilar, Jean Aguilar NO.:  [id number]  MEDICAL RECORD NO.:  [id number]  LOCATION:  5N21C                        FACILITY:  [facility]  PHYSICIAN:  Myrtie Neither, MD      DATE OF BIRTH:  06-30-1935  DATE OF ADMISSION:  02/20/2012 DATE OF DISCHARGE:                             HISTORY & PHYSICAL   CHIEF COMPLAINT:  Painful left knee.  HISTORY OF PRESENT ILLNESS:  This is a 76 year old female being treated over the years for degenerative joint disease of her bilateral knees with the left being greater than the right with progressive loss of function with pain both at rest as well as on ambulation.  The patient has had therapeutic injections, anti-inflammatories, pain medication, and steroid usage.  The patient has had progressive difficulty with loss of stability as well as had falls.  PAST MEDICAL HISTORY:  Hypertension, diabetes, asthma, bronchitis, sleep apnea, kidney stones, fibromyalgia, and cyst involving the left kidney.  SURGICAL PROCEDURES:  Appendectomy, abdominal hysterectomy, tonsillectomy, mastectomy, surgery on the left elbow for ulnar nerve transposition and second surgery for olecranon bursa, arthroscopy of the right shoulder, and arthroscopy of bilateral knees.  ALLERGIES:  MORPHINE.  MEDICATIONS:  Albuterol, Symbicort, Voltaren gel, Estrace, Lasix 20 mg, Lorcet 7.5/325, Motrin, lisinopril, hydrochlorothiazide, Crestor.  FAMILY HISTORY:  High blood pressure.  SOCIAL HISTORY:  No history of use of alcohol, tobacco, or illegal drugs.  PHYSICAL EXAMINATION:  VITAL SIGNS:  Temperature 98.3, pulse 73, blood pressure 161/77, respirations 20, O2 saturation 100%, height 5 feet 2 inches, weight 89.49 kg. HEAD:  Normocephalic. EYES:  Conjunctivae and sclerae clear. NECK:  Supple. CHEST:  Clear. CARDIAC:  S1 and S2.  Regular. EXTREMITIES:  Left knee genu varum.  Crepitus both medial and lateral compartment patellofemoral joint.  +2 effusion.  Range  of motion good, but limited.  Negative Homans test.  Neurovascular status is intact.  X-ray revealed loss of joint space both medial and lateral compartments with sclerosis and osteophytes.  IMPRESSION:  Degenerative joint disease, left knee.  PLAN:  Left total knee arthroplasty.     Myrtie Neither, MD     AC/MEDQ  D:  02/20/2012  T:  02/21/2012  Job:  [id number]

## 2012-02-21 NOTE — Progress Notes (Signed)
Referral received for SNF. Chart reviewed and CSW has spoken with RNCM who indicates that patient is for DC to home with Home Health and DME.  CSW to sign off. Please re-consult if CSW needs arise.  Ramez Arrona T. Stori Royse, LCSWA  [phone]  

## 2012-02-21 NOTE — Progress Notes (Signed)
Subjective: 1 Day Post-Op Procedure(s) (LRB): TOTAL KNEE ARTHROPLASTY (Left) Patient reports pain as 5 on 0-10 scale.    Objective: Vital signs in last 24 hours: Temp:  [97.6 F (36.4 C)-99.7 F (37.6 C)] 98.9 F (37.2 C) (09/20 0500) Pulse Rate:  [69-95] 95  (09/20 0500) Resp:  [12-21] 18  (09/20 0500) BP: (121-171)/(49-128) 140/85 mmHg (09/20 0500) SpO2:  [94 %-100 %] 98 % (09/20 0500) Weight:  [89.4 kg (197 lb 1.5 oz)] 89.4 kg (197 lb 1.5 oz) (09/19 1130)  Intake/Output from previous day: 09/19 0701 - 09/20 0700 In: 1835 [P.O.:480; I.V.:1300; IV Piggyback:55] Out: 1400 [Urine:1250; Blood:150] Intake/Output this shift:     Basename 02/21/12 0605  HGB 10.4*    Basename 02/21/12 0605  WBC 8.8  RBC 3.35*  HCT 30.9*  PLT 211   No results found for this basename: NA:2,K:2,CL:2,CO2:2,BUN:2,CREATININE:2,GLUCOSE:2,CALCIUM:2 in the last 72 hours  Basename 02/21/12 0605  LABPT --  INR 1.01    Dorsiflexion/Plantar flexion intact  Assessment/Plan: 1 Day Post-Op Procedure(s) (LRB): TOTAL KNEE ARTHROPLASTY (Left) Up with therapy  Erum Cercone F 02/21/2012, 9:26 AM

## 2012-02-21 NOTE — Progress Notes (Addendum)
Physical Therapy Evaluation Patient Details Name: Jean Aguilar MRN: [id number] DOB: 1936/03/24 Today's Date: 02/21/2012 Time: 4098-1191 PT Time Calculation (min): 51 min  PT Assessment / Plan / Recommendation Clinical Impression  Pt is 76 y.o. F s/p L TKA.  Pt will benefit from acute physical therapy services to increase strength endurance, balance, and safety awareness.    PT Assessment  Patient needs continued PT services    Follow Up Recommendations  Home health PT    Barriers to Discharge        Equipment Recommendations  Rolling walker with 5" wheels    Recommendations for Other Services Other (comment) (None)   Frequency 7X/week    Precautions / Restrictions Precautions Precautions: Knee Required Braces or Orthoses: Knee Immobilizer - Left Knee Immobilizer - Left: On except when in CPM Restrictions Weight Bearing Restrictions: Yes LLE Weight Bearing: Partial weight bearing (50%)   Pertinent Vitals/Pain 4/10 L LE and reports some pain in her back.      Mobility  Bed Mobility Bed Mobility: Supine to Sit;Sitting - Scoot to Edge of Bed Supine to Sit: 4: Min assist;HOB elevated Sitting - Scoot to [place] of Bed: 4: Min assist;With rail Details for Bed Mobility Assistance: (A) with lifting left OOB and maintain balance.  Cues for hand placement, proper technique, and balance. Transfers Transfers: Sit to Stand;Stand to Sit Sit to Stand: 4: Min assist Stand to Sit: 4: Min assist Details for Transfer Assistance: (A) with leg placement and balance.  Cues for hand placement and proper technique. Ambulation/Gait Ambulation/Gait Assistance: 4: Min assist Ambulation Distance (Feet): 15 Feet Assistive device: Rolling walker Ambulation/Gait Assistance Details: (A) with maintaining balance and safety.  Cues for proper technique. Gait Pattern: Step-to pattern;Decreased stride length Gait velocity: decreased Stairs: No    Exercises Total Joint Exercises Ankle  Circles/Pumps: AROM;Both;10 reps Quad Sets: AROM;Left;10 reps Heel Slides: AAROM;Left;5 reps   PT Diagnosis: Difficulty walking;Abnormality of gait;Generalized weakness;Acute pain  PT Problem List: Decreased strength;Decreased range of motion;Decreased activity tolerance;Decreased balance;Decreased mobility;Decreased knowledge of use of DME;Decreased safety awareness;Pain PT Treatment Interventions: DME instruction;Gait training;Functional mobility training;Therapeutic activities;Therapeutic exercise;Balance training;Neuromuscular re-education   PT Goals Acute Rehab PT Goals PT Goal Formulation: With patient Time For Goal Achievement: 02/28/12 Potential to Achieve Goals: Good Pt will go Supine/Side to Sit: with modified independence;with HOB 0 degrees PT Goal: Supine/Side to Sit - Progress: Goal set today Pt will go Sit to Supine/Side: with modified independence;with HOB 0 degrees PT Goal: Sit to Supine/Side - Progress: Goal set today Pt will go Sit to Stand: with modified independence PT Goal: Sit to Stand - Progress: Goal set today Pt will go Stand to Sit: with modified independence PT Goal: Stand to Sit - Progress: Goal set today Pt will Ambulate: >150 feet PT Goal: Ambulate - Progress: Goal set today Pt will Perform Home Exercise Program: Independently PT Goal: Perform Home Exercise Program - Progress: Goal set today  Visit Information  Last PT Received On: 02/21/12 Assistance Needed: +1    Subjective Data  Subjective: Ready to get out of this bed Patient Stated Goal: To go home   Prior Functioning  Home Living Lives With: Alone Available Help at Discharge: Friend(s) Type of Home: House Home Access: Level entry Home Layout: One level Bathroom Shower/Tub: Walk-in Nurse, learning disability: Standard Bathroom Accessibility: Yes How Accessible: Accessible via walker Home Adaptive Equipment: Bedside commode/3-in-1;Hand-held shower hose;Walker - four  wheeled (uses 3-1 as shower chair) Prior Function Level of Independence: Independent with  assistive device(s) Able to Take Stairs?: No Driving: Yes Vocation: Retired Musician: No difficulties Dominant Hand: Right    Cognition  Overall Cognitive Status: Appears within functional limits for tasks assessed/performed Arousal/Alertness: Awake/alert Orientation Level: Appears intact for tasks assessed Behavior During Session: [facility] for tasks performed    Extremity/Trunk Assessment     Balance    End of Session PT - End of Session Equipment Utilized During Treatment: Gait belt Activity Tolerance: Patient tolerated treatment well Patient left: in chair;with call bell/phone within reach Nurse Communication: Mobility status CPM Left Knee CPM Left Knee: Off  GP     Mirl Hillery 02/21/2012, 11:04 AM

## 2012-02-21 NOTE — Progress Notes (Signed)
ANTICOAGULATION CONSULT NOTE - Follow up  Pharmacy Consult for Coumadin Indication: VTE prophylaxis  Allergies  Allergen Reactions  . Penicillins Rash    IV  . Tuberculin Tests Other (See Comments)    Reaction at site, using chest x-rays instead  . Cephalexin Rash  . Morphine And Related Other (See Comments)    headache    Patient Measurements: Weight: 197 lb 1.5 oz (89.4 kg)  Vital Signs: Temp: 98.9 F (37.2 C) (09/20 0500) BP: 111/44 mmHg (09/20 1153) Pulse Rate: 95  (09/20 0500)  Labs:  Jean Aguilar 02/21/12 0605  HGB 10.4*  HCT 30.9*  PLT 211  APTT --  LABPROT 13.2  INR 1.01  HEPARINUNFRC --  CREATININE --  CKTOTAL --  CKMB --  TROPONINI --    CrCl is unknown because there is no height on file for the current visit.   Medical History: Past Medical History  Diagnosis Date  . Hypertension   . Complication of anesthesia     WAKE UP DURING KNEE ARTHROSCOPY  . Kidney stones   . Headache   . High cholesterol   . Asthma   . Chronic bronchitis     "get it qd" (02/20/2012)  . Sleep apnea     10 + 12 YRS NO MACHINE  . Type II diabetes mellitus   . Hypothyroidism     "used to take Synthroid; don't take anything now" (02/20/2012)  . GERD (gastroesophageal reflux disease)   . Lactose intolerance   . Arthritis     "full body" (02/20/2012)  . Fibromyalgia     Medications:  Prescriptions prior to admission  Medication Sig Dispense Refill  . albuterol (PROVENTIL HFA;VENTOLIN HFA) 108 (90 BASE) MCG/ACT inhaler Inhale 2 puffs into the lungs every 4 (four) hours as needed for wheezing.  1 Inhaler  0  . budesonide-formoterol (SYMBICORT) 160-4.5 MCG/ACT inhaler Inhale 2 puffs into the lungs 2 (two) times daily.      . diclofenac sodium (VOLTAREN) 1 % GEL Apply 2 g topically 4 (four) times daily.      Marland Kitchen estradiol (ESTRACE) 0.1 MG/GM vaginal cream Place 2 g vaginally daily as needed.      . furosemide (LASIX) 20 MG tablet Take 20 mg by mouth daily as needed. For excess  fluid      . HYDROcodone-acetaminophen (NORCO) 7.5-325 MG per tablet Take 1 tablet by mouth every 6 (six) hours as needed. Pain      . ibuprofen (ADVIL,MOTRIN) 200 MG tablet Take 600 mg by mouth every 6 (six) hours as needed. For pain      . lisinopril-hydrochlorothiazide (PRINZIDE,ZESTORETIC) 20-12.5 MG per tablet Take 1 tablet by mouth daily.      . rosuvastatin (CRESTOR) 10 MG tablet Take 10 mg by mouth daily.        Assessment: 76 y/o female patient s/p L TKA requiring coumadin for dvt prophylaxis. INR baseline wnl, coumadin started yesterday. Anticipate INR increase tomorrow. Goal of Therapy:  INR 2-3 Monitor platelets by anticoagulation protocol: Yes   Plan:  Repeat Coumadin 5mg  today and f/u daily protime.  Verlene Mayer, PharmD, BCPS Pager [phone] 02/21/2012,12:23 PM

## 2012-02-21 NOTE — Care Management Note (Signed)
    Page 1 of 2   02/21/2012     4:16:51 PM   CARE MANAGEMENT NOTE 02/21/2012  Patient:  Jean Aguilar, Jean Aguilar   Account Number:  [id number]  Date Initiated:  02/21/2012  Documentation initiated by:  Anette Guarneri  Subjective/Objective Assessment:   POD#1 s/p left TKA  [facility] services pre-arranged by MD office  Has DME except 3n1  CPM to be delivered to home     Action/Plan:   Home with [facility] services   Anticipated DC Date:  02/23/2012   Anticipated DC Plan:  HOME W HOME HEALTH SERVICES         Choice offered to / List presented to:     DME arranged  3-N-1  Levan Hurst      DME agency  [facility]        Status of service:  In process, will continue to follow Medicare Important Message given?   (If response is "NO", the following Medicare IM given date fields will be blank) Date Medicare IM given:   Date Additional Medicare IM given:    Discharge Disposition:    Per UR Regulation:  Reviewed for med. necessity/level of care/duration of stay  If discussed at Long Length of Stay Meetings, dates discussed:    Comments:  02/21/12  16:30  Anette Guarneri RN/CM PT has determined that patient needs RW for safety and not rollator for home use Explained this to patient, she is agreeable to obtaining RW from [facility], contacted [facility], RW to be delivered to room prior to discharge.   02/21/12  11:39  Anette Guarneri RN/CM MD office pre-arranged for [facility] services with [facility], spoke with [facility] and confirmed that they will follow patient Per Ms. [name] she has Rollator at home, PT to determine whether Rollator vs RW,  wants 3n1, patient choice for DME is [facility], contacted [facility], arranged for 3n1 to be delivered to room prior to discharge [employer] to deliver CPM to home after discharge.

## 2012-02-21 NOTE — Progress Notes (Signed)
Agree with Physical therapy evaluation.  [place], [place] DPT [phone]

## 2012-02-21 NOTE — Progress Notes (Signed)
Occupational Therapy Evaluation Patient Details Name: Jean Aguilar MRN: [id number] DOB: 03-26-1936 Today's Date: 02/21/2012 Time: 4098-1191 OT Time Calculation (min): 20 min  OT Assessment / Plan / Recommendation Clinical Impression  Pt s/p L TKA thus affecting PLOF. Will benefit from acute OT services to address below problem list in prep for d/c home.    OT Assessment  Patient needs continued OT Services    Follow Up Recommendations  No OT follow up;Supervision/Assistance - 24 hour    Barriers to Discharge      Equipment Recommendations  3 in 1 bedside comode    Recommendations for Other Services    Frequency  Min 2X/week    Precautions / Restrictions Precautions Precautions: Knee Required Braces or Orthoses: Knee Immobilizer - Left Knee Immobilizer - Left: On except when in CPM Restrictions Weight Bearing Restrictions: Yes LLE Weight Bearing: Partial weight bearing (50%)   Pertinent Vitals/Pain See vitals    ADL  Lower Body Bathing: Simulated;Min guard Where Assessed - Lower Body Bathing: Supported sit to stand Lower Body Dressing: Performed;Min guard Where Assessed - Lower Body Dressing: Supported sit to Pharmacist, hospital: [employer] engineer Method: Sit to Barista:  (chair) Equipment Used: Gait belt;Knee Immobilizer;Rolling walker Transfers/Ambulation Related to ADLs: Min assist for steadying. Max verbal cueing for correct sequencing and technique with RW. ADL Comments: Pt is able to lean anteriorly to don/doff bil socks while sitting EOC.  Educated pt on threading LLE through clothing first and last when doffing.     OT Diagnosis: Generalized weakness;Acute pain  OT Problem List: Decreased activity tolerance;Impaired balance (sitting and/or standing);Pain;Decreased knowledge of use of DME or AE;Decreased knowledge of precautions OT Treatment Interventions: Self-care/ADL training;DME and/or AE  instruction;Therapeutic activities;Patient/family education;Balance training   OT Goals Acute Rehab OT Goals OT Goal Formulation: With patient Time For Goal Achievement: 02/28/12 Potential to Achieve Goals: Good ADL Goals Pt Will Perform Grooming: with modified independence;Standing at sink (maintaining WB status) ADL Goal: Grooming - Progress: Goal set today Pt Will Transfer to Toilet: with modified independence;Ambulation;with DME;Comfort height toilet;Maintaining weight bearing status ADL Goal: Toilet Transfer - Progress: Goal set today Pt Will Perform Toileting - Clothing Manipulation: with modified independence;Sitting on 3-in-1 or toilet;Standing ADL Goal: Toileting - Clothing Manipulation - Progress: Goal set today Pt Will Perform Toileting - Hygiene: with modified independence;Sit to stand from 3-in-1/toilet;Standing at 3-in-1/toilet ADL Goal: Toileting - Hygiene - Progress: Goal set today Miscellaneous OT Goals Miscellaneous OT Goal #1: Pt will independently demonstrate correct hand placement during all sit<>stand transfers to maximize safety. OT Goal: Miscellaneous Goal #1 - Progress: Goal set today  Visit Information  Last OT Received On: 02/21/12 Assistance Needed: +1    Subjective Data      Prior Functioning  Vision/Perception  Home Living Lives With: Alone Available Help at Discharge: Friend(s) Type of Home: House Home Access: Level entry Home Layout: One level Bathroom Shower/Tub: Walk-in Nurse, learning disability: Standard Bathroom Accessibility: Yes How Accessible: Accessible via walker Home Adaptive Equipment: Bedside commode/3-in-1;Hand-held shower hose;Walker - four wheeled (uses 3-1 as shower chair) Additional Comments: 3n1 that patient currently has in home is borrowed from friend and is "worn out" per pt report.  Prior Function Level of Independence: Independent with assistive device(s) Able to Take Stairs?: No Driving:  Yes Vocation: Retired Musician: No difficulties Dominant Hand: Right      Cognition  Overall Cognitive Status: Appears within functional limits for tasks assessed/performed Arousal/Alertness: Awake/alert Orientation Level:  Appears intact for tasks assessed Behavior During Session: [facility] for tasks performed Cognition - Other Comments: Pt is very talkative and requries max VC to stay on task.    Extremity/Trunk Assessment Right Upper Extremity Assessment RUE ROM/Strength/Tone: Within functional levels Left Upper Extremity Assessment LUE ROM/Strength/Tone: Within functional levels   Mobility  Shoulder Instructions  Bed Mobility Bed Mobility: Not assessed Supine to Sit: 4: Min assist;HOB elevated Sitting - Scoot to Edge of Bed: 4: Min assist;With rail Details for Bed Mobility Assistance: (A) with lifting left OOB and maintain balance.  Cues for hand placement, proper technique, and balance. Transfers Transfers: Sit to Stand;Stand to Sit Sit to Stand: 4: Min assist;From chair/3-in-1;With upper extremity assist;With armrests Stand to Sit: 4: Min assist;To chair/3-in-1;With armrests;With upper extremity assist Details for Transfer Assistance: Assist for balance.  Max VC's and tactile cues for correct hand placement and proper technique.       Exercise    Balance     End of Session OT - End of Session Equipment Utilized During Treatment: Gait belt;Left knee immobilizer Activity Tolerance: Patient tolerated treatment well Patient left: in chair;with call bell/phone within reach;with family/visitor present Nurse Communication: Mobility status (pain in LUE due to IV) CPM Left Knee CPM Left Knee: Off  GO   02/21/2012 Cipriano Mile OTR/L Pager [phone] Office [phone]   Cipriano Mile 02/21/2012, 12:29 PM

## 2012-02-21 NOTE — Anesthesia Postprocedure Evaluation (Signed)
  Anesthesia Post-op Note  Patient: Jean Aguilar  Procedure(s) Performed: Procedure(s) (LRB) with comments: TOTAL KNEE ARTHROPLASTY (Left)  Patient Location: PACU  Anesthesia Type: GA combined with regional for post-op pain  Level of Consciousness: awake and alert   Airway and Oxygen Therapy: Patient Spontanous Breathing  Post-op Pain: mild  Post-op Assessment: Post-op Vital signs reviewed, Patient's Cardiovascular Status Stable, Respiratory Function Stable, Patent Airway, No signs of Nausea or vomiting, Adequate PO intake and Pain level controlled  Post-op Vital Signs: stable  Complications: No apparent anesthesia complications

## 2012-02-22 LAB — PROTIME-INR: INR: 1.01 (ref 0.00–1.49)

## 2012-02-22 LAB — CBC
HCT: 30.4 % — ABNORMAL LOW (ref 36.0–46.0)
Hemoglobin: 10 g/dL — ABNORMAL LOW (ref 12.0–15.0)
WBC: 8.3 10*3/uL (ref 4.0–10.5)

## 2012-02-22 MED ORDER — WARFARIN SODIUM 7.5 MG PO TABS
7.5000 mg | ORAL_TABLET | Freq: Once | ORAL | Status: AC
  Administered 2012-02-22: 7.5 mg via ORAL
  Filled 2012-02-22: qty 1

## 2012-02-22 NOTE — Progress Notes (Signed)
Subjective: 2 Days Post-Op Procedure(s) (LRB): TOTAL KNEE ARTHROPLASTY (Left) Patient reports pain as 5 on 0-10 scale.    Objective: Vital signs in last 24 hours: Temp:  [98.3 F (36.8 C)-101.9 F (38.8 C)] 98.8 F (37.1 C) (09/21 1700) Pulse Rate:  [76-102] 102  (09/21 0605) Resp:  [15-18] 18  (09/21 1700) BP: (122-164)/(47-66) 164/66 mmHg (09/21 1700) SpO2:  [96 %-99 %] 96 % (09/21 1700)  Intake/Output from previous day: 09/20 0701 - 09/21 0700 In: 1602.5 [P.O.:960; I.V.:642.5] Out: 950 [Urine:950] Intake/Output this shift: Total I/O In: 800 [P.O.:800] Out: 550 [Urine:550]   Basename 02/22/12 0505 02/21/12 0605  HGB 10.0* 10.4*    Basename 02/22/12 0505 02/21/12 0605  WBC 8.3 8.8  RBC 3.26* 3.35*  HCT 30.4* 30.9*  PLT 174 211   No results found for this basename: NA:2,K:2,CL:2,CO2:2,BUN:2,CREATININE:2,GLUCOSE:2,CALCIUM:2 in the last 72 hours  Basename 02/22/12 0505 02/21/12 0605  LABPT -- --  INR 1.01 1.01    Dorsiflexion/Plantar flexion intact  Assessment/Plan: 2 Days Post-Op Procedure(s) (LRB): TOTAL KNEE ARTHROPLASTY (Left) Up with therapy  Jaimie Redditt F 02/22/2012, 5:21 PM

## 2012-02-22 NOTE — Progress Notes (Signed)
Physical Therapy Treatment Patient Details Name: Jean Aguilar MRN: [id number] DOB: [birth year] Today's Date: 02/22/2012 Time: 7829-5621 PT Time Calculation (min): 30 min  PT Assessment / Plan / Recommendation Comments on Treatment Session  Pt c/o significant amount of pain this AM.  "It's 35/10".  Progressing slowly with ambulation tolerance.  Increased tx time due to pt incontinent of bladder with urine all over floor by bedside & requiring assist to clean area & pericare for pt.      Follow Up Recommendations  Home health PT    Barriers to Discharge        Equipment Recommendations  Rolling walker with 5" wheels    Recommendations for Other Services    Frequency 7X/week   Plan Discharge plan remains appropriate;Frequency remains appropriate    Precautions / Restrictions Precautions Precautions: Knee Required Braces or Orthoses: Knee Immobilizer - Left Knee Immobilizer - Left: On except when in CPM Restrictions Weight Bearing Restrictions: Yes LLE Weight Bearing: Partial weight bearing LLE Partial Weight Bearing Percentage or Pounds: 50    Pertinent Vitals/Pain "it's 35/10"  Lt knee.  RN made aware.  RN d/c'd PCA & began pt on oral pain medication    Mobility  Bed Mobility Bed Mobility: Not assessed Details for Bed Mobility Assistance: Pt sitting EOB upon arrival Transfers Transfers: Sit to Stand;Stand to Sit;Stand Pivot Transfers Sit to Stand: 4: Min guard;With upper extremity assist;With armrests;From bed;From chair/3-in-1 Stand to Sit: 4: Min guard;With upper extremity assist;With armrests;To chair/3-in-1 Stand Pivot Transfers: 4: Min guard Details for Transfer Assistance: cues for hand placement, technique, step sequencing when stepping backwards, & for L LE positioning when sitting to decrease discomfort.    Ambulation/Gait Ambulation/Gait Assistance: 4: Min guard Ambulation Distance (Feet): 20 Feet Assistive device: Rolling walker Ambulation/Gait Assistance  Details: Cues for sequencing, RW management, & technique.  Cues for heel strike, knee extension during stance, & use of UE's to off-load weight from L LE during Rt swing phase.  Gait Pattern: Step-through pattern;Decreased weight shift to left;Decreased hip/knee flexion - right;Decreased hip/knee flexion - left;Decreased stance time - left;Left flexed knee in stance;Trunk flexed;Antalgic Gait velocity: decreased Stairs: No Wheelchair Mobility Wheelchair Mobility: No      PT Goals Acute Rehab PT Goals Time For Goal Achievement: 02/28/12 Potential to Achieve Goals: Good PT Goal: Sit to Stand - Progress: Not met PT Goal: Stand to Sit - Progress: Not met PT Goal: Ambulate - Progress: Progressing toward goal (slowly)  Visit Information  Last PT Received On: 02/22/12 Assistance Needed: +1    Subjective Data  Subjective: "I peed all over the bed & floor" Patient Stated Goal: To go home   Cognition  Overall Cognitive Status: Appears within functional limits for tasks assessed/performed Arousal/Alertness: Awake/alert Orientation Level: Appears intact for tasks assessed Behavior During Session: [facility] for tasks performed    Balance     End of Session PT - End of Session Equipment Utilized During Treatment: Gait belt Activity Tolerance: Patient limited by pain Patient left: in chair;with call bell/phone within reach (pillow under Lt ankle for knee extension) Nurse Communication: Mobility status     Verdell Face, [place] [phone] 02/22/2012

## 2012-02-22 NOTE — Progress Notes (Signed)
ANTICOAGULATION CONSULT NOTE - Follow Up Consult  Pharmacy Consult for coumadin Indication: VTE prophylaxis  Allergies  Allergen Reactions  . Penicillins Rash    IV  . Tuberculin Tests Other (See Comments)    Reaction at site, using chest x-rays instead  . Cephalexin Rash  . Morphine And Related Other (See Comments)    headache    Patient Measurements: Weight: 197 lb 1.5 oz (89.4 kg)   Vital Signs: Temp: 101.9 F (38.8 C) (09/21 0605) Temp src: Oral (09/21 0605) BP: 136/61 mmHg (09/21 0605) Pulse Rate: 102  (09/21 0605)  Labs:  [name] 02/22/12 0505 02/21/12 0605  HGB 10.0* 10.4*  HCT 30.4* 30.9*  PLT 174 211  APTT -- --  LABPROT 13.2 13.2  INR 1.01 1.01  HEPARINUNFRC -- --  CREATININE -- --  CKTOTAL -- --  CKMB -- --  TROPONINI -- --    CrCl is unknown because there is no height on file for the current visit.   Medications:  Scheduled:    . acetaminophen  1,000 mg Intravenous Q6H  . atorvastatin  10 mg Oral q1800  . budesonide-formoterol  2 puff Inhalation BID  . docusate sodium  100 mg Oral BID  . ferrous sulfate  325 mg Oral TID PC  . hydrochlorothiazide  12.5 mg Oral Daily  . HYDROmorphone PCA 0.3 mg/mL   Intravenous Q4H  . lisinopril  20 mg Oral Daily  . warfarin  5 mg Oral ONCE-1800  . warfarin   Does not apply Once  . Warfarin - Pharmacist Dosing Inpatient   Does not apply q1800    Assessment: 76 y.o female s/p TKA on coumadin for DVT prophylaxis. INR 1.01 after 2 doses of 5 mg. No bleeding reported.  Goal of Therapy:  INR 2-3 Monitor platelets by anticoagulation protocol: Yes   Plan:  Coumadin 7.5 mg x1 today Follow up daily INR Monitor s/sx of bleeding  Bola A. Wandra Feinstein D Clinical Pharmacist Pager:[phone] Phone [phone] 02/22/2012 8:47 AM

## 2012-02-23 LAB — GLUCOSE, CAPILLARY: Glucose-Capillary: 103 mg/dL — ABNORMAL HIGH (ref 70–99)

## 2012-02-23 LAB — CBC
HCT: 31.4 % — ABNORMAL LOW (ref 36.0–46.0)
Hemoglobin: 10.7 g/dL — ABNORMAL LOW (ref 12.0–15.0)
RBC: 3.38 MIL/uL — ABNORMAL LOW (ref 3.87–5.11)
WBC: 9 10*3/uL (ref 4.0–10.5)

## 2012-02-23 LAB — PROTIME-INR: INR: 1.07 (ref 0.00–1.49)

## 2012-02-23 MED ORDER — WARFARIN SODIUM 7.5 MG PO TABS
7.5000 mg | ORAL_TABLET | Freq: Once | ORAL | Status: AC
  Administered 2012-02-23: 7.5 mg via ORAL
  Filled 2012-02-23: qty 1

## 2012-02-23 NOTE — Progress Notes (Signed)
Occupational Therapy Treatment Patient Details Name: Jean Aguilar MRN: [id number] DOB: 08-07-1935 Today's Date: 02/23/2012 Time: 0347-4259 OT Time Calculation (min): 23 min  OT Assessment / Plan / Recommendation Comments on Treatment Session Pt overall requires min guard assist for functional mobility and ADLs. However, pt continues to requires max verbal cueing for safety due to poor safety awareness during transfers.    Follow Up Recommendations  No OT follow up;Supervision/Assistance - 24 hour    Barriers to Discharge       Equipment Recommendations  3 in 1 bedside comode    Recommendations for Other Services    Frequency Min 2X/week   Plan Discharge plan remains appropriate    Precautions / Restrictions Precautions Precautions: Knee Restrictions Weight Bearing Restrictions: Yes LLE Weight Bearing: Partial weight bearing LLE Partial Weight Bearing Percentage or Pounds: 50   Pertinent Vitals/Pain See vitals    ADL  Grooming: Performed;Wash/dry hands Where Assessed - Grooming: Unsupported standing Toilet Transfer: Performed;Min guard Statistician Method: Sit to Barista: Raised toilet seat with arms (or 3-in-1 over toilet) Toileting - Clothing Manipulation and Hygiene: Performed;Supervision/safety Where Assessed - Engineer, mining and Hygiene: Sit to stand from 3-in-1 or toilet Equipment Used: Gait belt;Rolling walker Transfers/Ambulation Related to ADLs: Close supervision for safety with RW ADL Comments: Pt required max verbal cueing for safe hand placement and technique during all components of funcitonal mobility. When ambulating with RW, pt becomes distracted and slides bil hands forward off RW. Needs VC for correct hand placement.  Spent significant time on demonstrating and verbalizing proper technique for basic sit<>stand transfer as pt frequently demonstrates incorrect hand placement and/or incorrect LE positioning.      OT Diagnosis:    OT Problem List:   OT Treatment Interventions:     OT Goals ADL Goals Pt Will Perform Grooming: with modified independence;Standing at sink ADL Goal: Grooming - Progress: Progressing toward goals Pt Will Transfer to Toilet: with modified independence;Ambulation;with DME;Comfort height toilet;Maintaining weight bearing status ADL Goal: Toilet Transfer - Progress: Progressing toward goals Pt Will Perform Toileting - Clothing Manipulation: with modified independence;Sitting on 3-in-1 or toilet;Standing ADL Goal: Toileting - Clothing Manipulation - Progress: Progressing toward goals Pt Will Perform Toileting - Hygiene: with modified independence;Sit to stand from 3-in-1/toilet;Standing at 3-in-1/toilet ADL Goal: Toileting - Hygiene - Progress: Progressing toward goals Miscellaneous OT Goals Miscellaneous OT Goal #1: Pt will independently demonstrate correct hand placement during all sit<>stand transfers to maximize safety. OT Goal: Miscellaneous Goal #1 - Progress: Progressing toward goals  Visit Information  Last OT Received On: 02/23/12 Assistance Needed: +1    Subjective Data      Prior Functioning       Cognition  Overall Cognitive Status: Appears within functional limits for tasks assessed/performed Arousal/Alertness: Awake/alert Orientation Level: Appears intact for tasks assessed Behavior During Session: [facility] for tasks performed    Mobility  Shoulder Instructions Bed Mobility Bed Mobility: Sitting - Scoot to Edge of Bed;Supine to Sit Supine to Sit: 4: Min guard;HOB elevated Sitting - Scoot to [place] of Bed: 4: Min guard Details for Bed Mobility Assistance: Min guard for safety as pt's hips were near EOB. VC's  for safety, Transfers Transfers: Sit to Stand;Stand to Sit Sit to Stand: 4: Min guard;From toilet;From chair/3-in-1;From bed;With armrests;With upper extremity assist Stand to Sit: 4: Min guard;To chair/3-in-1;To toilet;With armrests;With upper  extremity assist Details for Transfer Assistance: Requires cues for hand placement and LLE positioning.  At one time, pt attempted  to stand with ankles crossed and required max cueing to correct position.       Exercises      Balance     End of Session OT - End of Session Equipment Utilized During Treatment: Gait belt Activity Tolerance: Patient tolerated treatment well Patient left: in chair;with call bell/phone within reach Nurse Communication: Mobility status  GO    02/23/2012 Cipriano Mile OTR/L Pager [phone] Office [phone]  Cipriano Mile 02/23/2012, 9:40 AM

## 2012-02-23 NOTE — Progress Notes (Signed)
ANTICOAGULATION CONSULT NOTE - Follow Up Consult  Pharmacy Consult for coumadin Indication: VTE prophylaxis  Allergies  Allergen Reactions  . Penicillins Rash    IV  . Tuberculin Tests Other (See Comments)    Reaction at site, using chest x-rays instead  . Cephalexin Rash  . Morphine And Related Other (See Comments)    headache    Patient Measurements: Weight: 197 lb 1.5 oz (89.4 kg)  Vital Signs: Temp: 99.5 F (37.5 C) (09/22 0639) Temp src: Oral (09/21 2214) BP: 163/67 mmHg (09/22 0639) Pulse Rate: 83  (09/22 0639)  Labs:  Basename 02/23/12 0600 02/22/12 0505 02/21/12 0605  HGB 10.7* 10.0* --  HCT 31.4* 30.4* 30.9*  PLT 194 174 211  APTT -- -- --  LABPROT 13.8 13.2 13.2  INR 1.07 1.01 1.01  HEPARINUNFRC -- -- --  CREATININE -- -- --  CKTOTAL -- -- --  CKMB -- -- --  TROPONINI -- -- --    CrCl is unknown because there is no height on file for the current visit.   Medications:  Scheduled:    . atorvastatin  10 mg Oral q1800  . budesonide-formoterol  2 puff Inhalation BID  . docusate sodium  100 mg Oral BID  . ferrous sulfate  325 mg Oral TID PC  . hydrochlorothiazide  12.5 mg Oral Daily  . HYDROmorphone PCA 0.3 mg/mL   Intravenous Q4H  . lisinopril  20 mg Oral Daily  . warfarin  7.5 mg Oral ONCE-1800  . warfarin   Does not apply Once  . Warfarin - Pharmacist Dosing Inpatient   Does not apply q1800    Assessment: 76 y.o female s/p TKA on coumadin for DVT prophylaxis. INR continues to be subtherapeutic, INR this AM 1.07, anticipate INR increase tomorrow. No bleeding reported.   Goal of Therapy:  INR 2-3 Monitor platelets by anticoagulation protocol: Yes   Plan:  Coumadin 7.5 mg x1 today  Follow up daily INR  Monitor s/sx of bleeding  Bola A. Wandra Feinstein D Clinical Pharmacist Pager:[phone] Phone [phone] 02/23/2012 8:19 AM

## 2012-02-23 NOTE — Progress Notes (Signed)
Subjective: 3 Days Post-Op Procedure(s) (LRB): TOTAL KNEE ARTHROPLASTY (Left) Patient reports pain as 3 on 0-10 scale.    Objective: Vital signs in last 24 hours: Temp:  [98.8 F (37.1 C)-100.8 F (38.2 C)] 99.5 F (37.5 C) (09/22 0639) Pulse Rate:  [83-96] 83  (09/22 0639) Resp:  [16-18] 16  (09/22 1139) BP: (159-164)/(62-67) 163/67 mmHg (09/22 0639) SpO2:  [92 %-99 %] 96 % (09/22 1139)  Intake/Output from previous day: 09/21 0701 - 09/22 0700 In: 1760 [P.O.:1760] Out: 550 [Urine:550] Intake/Output this shift:     Basename 02/23/12 0600 02/22/12 0505 02/21/12 0605  HGB 10.7* 10.0* 10.4*    Basename 02/23/12 0600 02/22/12 0505  WBC 9.0 8.3  RBC 3.38* 3.26*  HCT 31.4* 30.4*  PLT 194 174   No results found for this basename: NA:2,K:2,CL:2,CO2:2,BUN:2,CREATININE:2,GLUCOSE:2,CALCIUM:2 in the last 72 hours  Basename 02/23/12 0600 02/22/12 0505  LABPT -- --  INR 1.07 1.01    Neurovascular intact  Assessment/Plan: 3 Days Post-Op Procedure(s) (LRB): TOTAL KNEE ARTHROPLASTY (Left) Plan for discharge tomorrow  Kennieth Rad 02/23/2012, 12:54 PM

## 2012-02-23 NOTE — Progress Notes (Addendum)
**  Late entry for 02/22/12**  PT Progress Notes:     02/22/12 1100  PT Visit Information  Last PT Received On 02/22/12  Assistance Needed +1  PT Time Calculation  PT Start Time 1115  PT Stop Time 1138  PT Time Calculation (min) 23 min  Subjective Data  Patient Stated Goal To go home  Precautions  Precautions Knee  Required Braces or Orthoses Knee Immobilizer - Left  Knee Immobilizer - Left On except when in CPM  Restrictions  LLE Weight Bearing PWB  LLE Partial Weight Bearing Percentage or Pounds 50  Cognition  Overall Cognitive Status Appears within functional limits for tasks assessed/performed  Arousal/Alertness Awake/alert  Orientation Level Appears intact for tasks assessed  Behavior During Session [facility] for tasks performed  Bed Mobility  Bed Mobility Not assessed  Transfers  Transfers Sit to Stand;Stand to Sit  Sit to Stand 4: Min guard;With upper extremity assist;With armrests;From chair/3-in-1  Stand to Sit 4: Min guard;With upper extremity assist;With armrests;To chair/3-in-1  Details for Transfer Assistance cont's to require cues for hand placement & LLE positioning when sitting  Ambulation/Gait  Ambulation/Gait Assistance 4: Min guard  Ambulation Distance (Feet) 40 Feet  Assistive device Rolling walker  Ambulation/Gait Assistance Details Cont's to require mod/max directional cues for sequencing, safe RW advancement, & technique.  Pt tends to get in a hurry & requires increased cues for safety.  Cues for increased heel strike & terminal knee ext. LLE during stance phase.    Gait Pattern Step-through pattern;Decreased stance time - left;Decreased step length - left;Decreased step length - right;Decreased weight shift to left;Antalgic (decreased floor clearance)  Stairs No  Wheelchair Mobility  Wheelchair Mobility No  Balance  Balance Assessed No  Exercises  Exercises Total Joint  Total Joint Exercises  Ankle Circles/Pumps AROM;Both;10 reps  Quad Sets AROM;Both;10  reps  Heel Slides AAROM;Left;5 reps;Limitations  Hip ABduction/ADduction AAROM;Left;10 reps  Straight Leg Raises AAROM;Left;10 reps  Long [address] [place];Left;10 reps  Heel Slides Limitations limited by pain  PT - End of Session  Equipment Utilized During Treatment Gait belt  Activity Tolerance Patient tolerated treatment well;Patient limited by pain  Patient left in chair;with call bell/phone within reach  PT - Assessment/Plan  Comments on Treatment Session Pt mobilizes with [facility] Assist however, pt cont's to require mod/max directional cues for safe technique with mobility.  If pt does not progress with ability to mobilize with less cues for safety she may benefit from SNF prior to d/c home to increase independence with mobility for safe return to home.   PT Plan Discharge plan remains appropriate (may need to be updated if does not progress)  PT Frequency 7X/week  Follow Up Recommendations Home health PT  Equipment Recommended Rolling walker with 5" wheels  Acute Rehab PT Goals  Time For Goal Achievement 02/28/12  Potential to Achieve Goals Good  PT Goal: Sit to Stand - Progress Not met  PT Goal: Stand to Sit - Progress Not met  PT Goal: Ambulate - Progress Not met  PT Goal: Perform Home Exercise Program - Progress Not met  PT General Charges  $$ ACUTE PT VISIT 1 Procedure  PT Treatments  $Gait Training 8-22 mins  $Therapeutic Exercise 8-22 mins     Verdell Face, [place] [phone] 02/23/2012

## 2012-02-23 NOTE — Progress Notes (Signed)
Physical Therapy Treatment Patient Details Name: Jean Aguilar MRN: [id number] DOB: 1935/06/25 Today's Date: 02/23/2012 Time: 7829-5621 PT Time Calculation (min): 23 min  PT Assessment / Plan / Recommendation Comments on Treatment Session  Pt cont's to require VC's for safe mobilty but is improving.      Follow Up Recommendations  Home health PT    Barriers to Discharge        Equipment Recommendations  3 in 1 bedside comode    Recommendations for Other Services    Frequency 7X/week   Plan Discharge plan remains appropriate    Precautions / Restrictions Precautions Precautions: Knee Required Braces or Orthoses: Knee Immobilizer - Left Knee Immobilizer - Left: On except when in CPM Restrictions LLE Weight Bearing: Partial weight bearing LLE Partial Weight Bearing Percentage or Pounds: 50       Mobility  Bed Mobility Bed Mobility: Sitting - Scoot to Edge of Bed;Supine to Sit Supine to Sit: 4: Min guard;HOB elevated Sitting - Scoot to [place] of Bed: 4: Min guard Details for Bed Mobility Assistance: Min guard for safety as pt's hips were near EOB. VC's  for safety, Transfers Transfers: Sit to Stand;Stand to Sit Sit to Stand: 4: Min guard;From toilet;From chair/3-in-1;From bed;With armrests;With upper extremity assist Stand to Sit: 4: Min guard;To chair/3-in-1;To toilet;With armrests;With upper extremity assist Details for Transfer Assistance: Requires cues for hand placement and LLE positioning.  At one time, pt attempted to stand with ankles crossed and required max cueing to correct position. Ambulation/Gait Ambulation/Gait Assistance: 4: Min guard Ambulation Distance (Feet): 60 Feet Assistive device: Rolling walker Ambulation/Gait Assistance Details: Cues for sequencing, safe body positioning inside RW, terminal knee extension during stance phase, heel strike, & posture  Gait Pattern: Step-through pattern;Left flexed knee in stance;Decreased stance time -  left;Decreased step length - right;Decreased step length - left (decreased floor clearance R>L) Gait velocity: decreased Stairs: No Wheelchair Mobility Wheelchair Mobility: No    Exercises Total Joint Exercises Ankle Circles/Pumps: AROM;Both;10 reps Quad Sets: AROM;Both;10 reps Heel Slides: AAROM;Left;10 reps Hip ABduction/ADduction: AAROM;Left;10 reps Straight Leg Raises: AAROM;Left;10 reps Long Arc Quad: AAROM;Left;10 reps    PT Goals Acute Rehab PT Goals Time For Goal Achievement: 02/28/12 Potential to Achieve Goals: Good PT Goal: Supine/Side to Sit - Progress: Progressing toward goal PT Goal: Sit to Stand - Progress: Progressing toward goal PT Goal: Stand to Sit - Progress: Progressing toward goal PT Goal: Ambulate - Progress: Progressing toward goal PT Goal: Perform Home Exercise Program - Progress: Progressing toward goal  Visit Information  Last PT Received On: 02/23/12 Assistance Needed: +1    Subjective Data      Cognition  Overall Cognitive Status: Appears within functional limits for tasks assessed/performed Arousal/Alertness: Awake/alert Orientation Level: Appears intact for tasks assessed Behavior During Session: [facility] for tasks performed    Balance     End of Session PT - End of Session Equipment Utilized During Treatment: Gait belt Activity Tolerance: Patient tolerated treatment well;Patient limited by pain Patient left: in chair;with call bell/phone within reach Nurse Communication: Mobility status     Verdell Face, [place] [phone] 02/23/2012

## 2012-02-24 ENCOUNTER — Other Ambulatory Visit: Payer: Self-pay | Admitting: Orthopedic Surgery

## 2012-02-24 LAB — PROTIME-INR: INR: 1.22 (ref 0.00–1.49)

## 2012-02-24 MED ORDER — OXYCODONE HCL 5 MG PO TABS: 5.0000 mg | ORAL_TABLET | ORAL | Status: DC | PRN

## 2012-02-24 MED ORDER — WARFARIN - PHARMACIST DOSING INPATIENT: 5.0000 | Freq: Every day | Status: DC

## 2012-02-24 MED ORDER — FERROUS SULFATE 325 (65 FE) MG PO TABS: 325.0000 mg | ORAL_TABLET | Freq: Three times a day (TID) | ORAL | Status: DC

## 2012-02-24 MED ORDER — METHOCARBAMOL 500 MG PO TABS: 500.0000 mg | ORAL_TABLET | Freq: Four times a day (QID) | ORAL | Status: DC | PRN

## 2012-02-24 NOTE — Progress Notes (Signed)
Physical Therapy Treatment Patient Details Name: Jean Aguilar MRN: [id number] DOB: 1936-03-22 Today's Date: 02/24/2012 Time: 4098-1191 PT Time Calculation (min): 27 min  PT Assessment / Plan / Recommendation Comments on Treatment Session  Pt required cues for safety.  During session pt decreased safety during transfers and ambulation due to impulsive acts.    Follow Up Recommendations  Home health PT    Barriers to Discharge        Equipment Recommendations  3 in 1 bedside comode    Recommendations for Other Services    Frequency 7X/week   Plan Discharge plan remains appropriate    Precautions / Restrictions Precautions Precautions: Knee Restrictions Weight Bearing Restrictions: Yes LLE Weight Bearing: Partial weight bearing LLE Partial Weight Bearing Percentage or Pounds: 50   Pertinent Vitals/Pain Pain in L hip and L LE.    Mobility  Bed Mobility Bed Mobility: Supine to Sit;Sitting - Scoot to Edge of Bed;Sit to Supine Supine to Sit: 5: Supervision Sitting - Scoot to Edge of Bed: 5: Supervision Details for Bed Mobility Assistance: Supervision for safety and required minimal cueing Transfers Transfers: Sit to Stand;Stand to Sit Sit to Stand: 5: Supervision;From chair/3-in-1;From bed Stand to Sit: 4: Min guard;To bed;To chair/3-in-1 Details for Transfer Assistance: Required cues for RW placement on stand to sit transfer. Ambulation/Gait Ambulation/Gait Assistance: 4: Min guard Ambulation Distance (Feet): 150 Feet Assistive device: Rolling walker Ambulation/Gait Assistance Details: Cues for hand placement Gait Pattern: Step-through pattern;Decreased step length - left Gait velocity: decreased Stairs: No Wheelchair Mobility Wheelchair Mobility: No    Exercises Total Joint Exercises Ankle Circles/Pumps: AROM;Both;5 reps Quad Sets: AROM;Left;5 reps Short Arc Quad: AROM;Left;5 reps Heel Slides: AAROM;Left;5 reps Hip ABduction/ADduction: AROM;Left;5  reps Straight Leg Raises: AAROM;Left;5 reps   PT Diagnosis:    PT Problem List:   PT Treatment Interventions:     PT Goals Acute Rehab PT Goals Time For Goal Achievement: 02/28/12 Potential to Achieve Goals: Good Pt will go Supine/Side to Sit: with modified independence;with HOB 0 degrees PT Goal: Supine/Side to Sit - Progress: Progressing toward goal Pt will go Sit to Supine/Side: with modified independence;with HOB 0 degrees PT Goal: Sit to Supine/Side - Progress: Progressing toward goal Pt will go Sit to Stand: with modified independence PT Goal: Sit to Stand - Progress: Progressing toward goal Pt will go Stand to Sit: with modified independence PT Goal: Stand to Sit - Progress: Progressing toward goal Pt will Ambulate: >150 feet PT Goal: Ambulate - Progress: Partly met Pt will Perform Home Exercise Program: Independently PT Goal: Perform Home Exercise Program - Progress: Progressing toward goal  Visit Information  Last PT Received On: 02/24/12 Assistance Needed: +1    Subjective Data  Subjective: Didn't expect to see me sitting in this chair did you? Patient Stated Goal: To go home   Cognition  Overall Cognitive Status: Appears within functional limits for tasks assessed/performed Arousal/Alertness: Awake/alert Orientation Level: Appears intact for tasks assessed Behavior During Session: [facility] for tasks performed    Balance     End of Session PT - End of Session Equipment Utilized During Treatment: Gait belt Activity Tolerance: Patient tolerated treatment well Patient left: in chair;with call bell/phone within reach Nurse Communication: Mobility status   GP     Johnda Billiot 02/24/2012, 8:31 AM

## 2012-02-24 NOTE — Progress Notes (Signed)
Agree with PT treatment note.  Kristiana Jacko, PT DPT [phone]  

## 2012-02-25 NOTE — Discharge Summary (Signed)
NAME:  Jean Aguilar, Jean Aguilar NO.:  [id number]  MEDICAL RECORD NO.:  [id number]  LOCATION:  5N21C                        FACILITY:  [facility]  PHYSICIAN:  Myrtie Neither, MD      DATE OF BIRTH:  05/05/1936  DATE OF ADMISSION:  02/20/2012 DATE OF DISCHARGE:  02/24/2012                              DISCHARGE SUMMARY   ADMITTING DIAGNOSES:  Degenerative joint disease, left knee; hypertension; history of kidney stones, history of asthma; hypercholesterolemia; history of chronic bronchitis; sleep apnea; diabetes mellitus; hypothyroidism; gastroesophageal reflux disease; and fibromyalgia.  The patient also had surgical history of bilateral knee arthroscopy, shoulder arthroscopy, ulnar nerve transposition, excision of olecranon bursa, history of abdominal hysterectomy, cardiac catheterization, and right mastectomy.  SOCIAL HISTORY:  Denies use of alcohol, tobacco, or illegal drugs.  ALLERGIES:  Penicillin, tuberculin test.  FAMILY HISTORY:  Hypertension.  MEDICATIONS:  Albuterol, Estrace, Lasix, Norco, Lipitor, nifedipine, Symbicort.  PERTINENT HISTORY:  This is a 76 year old followed in the office for degenerative joint disease with progressive worsening, loss of function. Pertinent physical exam of the left knee genu varum, crepitus, medial and lateral compartment, patellofemoral crepitus +2 effusion, range of motion good but limited.  Neurovascular status is intact.  X-rays revealed sclerosis and loss of joint space.  Preop laboratories were done; CBC, EKG, chest x-ray, PT, PTT, INR, platelet count, CMET, and UA.  The patient's lab was stable enough to undergo surgery.  The patient underwent left total knee arthroplasty, tolerated procedure quite well.  Postop course was fairly benign. Hemoglobin and hematocrit remained stable.  The patient started on use of CPM physical therapy, partial weightbearing on the left side.  Pre and postop IV antibiotics. Coumadin therapy  prophylaxis.  The patient progressed quite well with range of motion presently in the past about 85 degrees, lacking in extension by 10 degrees.  Ambulating with an walker.  Wound is healing quite well.  H and H is stable, afebrile.  Pain is under control with use of oxycodone 1-2 q.4 p.r.n. for pain.  The patient is being discharged on ferrous sulfate 325 mg t.i.d., Coumadin 5 mg daily, weekly INRs.  Home health physical therapy arrangements.  Robaxin 500 mg q.6 hours p.r.n. pain.  The patient to return to office in 1 week.  The patient is being discharged in stable and satisfactory condition.     Myrtie Neither, MD     AC/MEDQ  D:  02/24/2012  T:  02/25/2012  Job:  [id number]

## 2012-04-08 ENCOUNTER — Ambulatory Visit: Payer: Medicare Other | Admitting: Physical Therapy

## 2012-05-04 ENCOUNTER — Ambulatory Visit: Payer: Medicare Other | Attending: Orthopedic Surgery | Admitting: Physical Therapy

## 2012-05-04 DIAGNOSIS — IMO0001 Reserved for inherently not codable concepts without codable children: Secondary | ICD-10-CM | POA: Insufficient documentation

## 2012-05-04 DIAGNOSIS — M25569 Pain in unspecified knee: Secondary | ICD-10-CM | POA: Insufficient documentation

## 2012-05-04 DIAGNOSIS — Z96659 Presence of unspecified artificial knee joint: Secondary | ICD-10-CM | POA: Insufficient documentation

## 2012-05-04 DIAGNOSIS — R262 Difficulty in walking, not elsewhere classified: Secondary | ICD-10-CM | POA: Insufficient documentation

## 2012-05-04 DIAGNOSIS — M25669 Stiffness of unspecified knee, not elsewhere classified: Secondary | ICD-10-CM | POA: Insufficient documentation

## 2012-05-06 ENCOUNTER — Ambulatory Visit: Payer: Medicare Other | Admitting: Physical Therapy

## 2012-05-07 ENCOUNTER — Ambulatory Visit: Payer: Medicare Other | Admitting: Physical Therapy

## 2012-05-11 ENCOUNTER — Ambulatory Visit: Payer: Medicare Other | Admitting: Physical Therapy

## 2012-05-13 ENCOUNTER — Ambulatory Visit: Payer: Medicare Other | Admitting: Physical Therapy

## 2012-05-13 ENCOUNTER — Encounter: Payer: Medicare Other | Admitting: Physical Therapy

## 2012-05-14 ENCOUNTER — Ambulatory Visit: Payer: Medicare Other | Admitting: Physical Therapy

## 2012-05-15 ENCOUNTER — Encounter: Payer: Medicare Other | Admitting: Physical Therapy

## 2012-05-18 ENCOUNTER — Ambulatory Visit: Payer: Medicare Other | Admitting: Physical Therapy

## 2012-05-20 ENCOUNTER — Ambulatory Visit: Payer: Medicare Other | Admitting: Physical Therapy

## 2012-05-21 ENCOUNTER — Ambulatory Visit: Payer: Medicare Other | Admitting: Physical Therapy

## 2012-05-28 ENCOUNTER — Ambulatory Visit: Payer: Medicare Other | Admitting: Physical Therapy

## 2012-06-01 ENCOUNTER — Ambulatory Visit: Payer: Medicare Other | Admitting: Physical Therapy

## 2012-12-10 ENCOUNTER — Emergency Department (HOSPITAL_COMMUNITY): Payer: Medicare Other

## 2012-12-10 ENCOUNTER — Encounter (HOSPITAL_COMMUNITY): Payer: Self-pay | Admitting: Emergency Medicine

## 2012-12-10 ENCOUNTER — Emergency Department (HOSPITAL_COMMUNITY)
Admission: EM | Admit: 2012-12-10 | Discharge: 2012-12-10 | Disposition: A | Discharge status: Home / Self Care | Payer: Medicare Other | Attending: Emergency Medicine | Admitting: Emergency Medicine

## 2012-12-10 DIAGNOSIS — F17200 Nicotine dependence, unspecified, uncomplicated: Secondary | ICD-10-CM | POA: Insufficient documentation

## 2012-12-10 DIAGNOSIS — R51 Headache: Secondary | ICD-10-CM | POA: Insufficient documentation

## 2012-12-10 DIAGNOSIS — J45909 Unspecified asthma, uncomplicated: Secondary | ICD-10-CM | POA: Insufficient documentation

## 2012-12-10 DIAGNOSIS — M2550 Pain in unspecified joint: Secondary | ICD-10-CM | POA: Insufficient documentation

## 2012-12-10 DIAGNOSIS — Z9861 Coronary angioplasty status: Secondary | ICD-10-CM | POA: Insufficient documentation

## 2012-12-10 DIAGNOSIS — Z8739 Personal history of other diseases of the musculoskeletal system and connective tissue: Secondary | ICD-10-CM | POA: Insufficient documentation

## 2012-12-10 DIAGNOSIS — E119 Type 2 diabetes mellitus without complications: Secondary | ICD-10-CM | POA: Insufficient documentation

## 2012-12-10 DIAGNOSIS — I160 Hypertensive urgency: Secondary | ICD-10-CM

## 2012-12-10 DIAGNOSIS — Z8709 Personal history of other diseases of the respiratory system: Secondary | ICD-10-CM | POA: Insufficient documentation

## 2012-12-10 DIAGNOSIS — Z880 Allergy status to penicillin: Secondary | ICD-10-CM | POA: Insufficient documentation

## 2012-12-10 DIAGNOSIS — M25511 Pain in right shoulder: Secondary | ICD-10-CM

## 2012-12-10 DIAGNOSIS — Z79899 Other long term (current) drug therapy: Secondary | ICD-10-CM | POA: Insufficient documentation

## 2012-12-10 DIAGNOSIS — Z8639 Personal history of other endocrine, nutritional and metabolic disease: Secondary | ICD-10-CM | POA: Insufficient documentation

## 2012-12-10 DIAGNOSIS — Z8719 Personal history of other diseases of the digestive system: Secondary | ICD-10-CM | POA: Insufficient documentation

## 2012-12-10 DIAGNOSIS — Z862 Personal history of diseases of the blood and blood-forming organs and certain disorders involving the immune mechanism: Secondary | ICD-10-CM | POA: Insufficient documentation

## 2012-12-10 DIAGNOSIS — Z87442 Personal history of urinary calculi: Secondary | ICD-10-CM | POA: Insufficient documentation

## 2012-12-10 DIAGNOSIS — M25519 Pain in unspecified shoulder: Secondary | ICD-10-CM | POA: Insufficient documentation

## 2012-12-10 DIAGNOSIS — E7800 Pure hypercholesterolemia, unspecified: Secondary | ICD-10-CM | POA: Insufficient documentation

## 2012-12-10 DIAGNOSIS — I10 Essential (primary) hypertension: Secondary | ICD-10-CM | POA: Insufficient documentation

## 2012-12-10 LAB — BASIC METABOLIC PANEL
GFR calc Af Amer: 65 mL/min — ABNORMAL LOW (ref >90)
GFR calc non Af Amer: 56 mL/min — ABNORMAL LOW (ref >90)
Glucose, Bld: 151 mg/dL — ABNORMAL HIGH (ref 70–99)
Potassium: 3.2 mEq/L — ABNORMAL LOW (ref 3.5–5.1)
Sodium: 140 mEq/L (ref 135–145)

## 2012-12-10 LAB — TROPONIN I: Troponin I: <0.3 ng/mL (ref <0.30)

## 2012-12-10 MED ORDER — HYDROCODONE-ACETAMINOPHEN 5-325 MG PO TABS: 1.0000 | ORAL_TABLET | Freq: Four times a day (QID) | ORAL | Status: DC | PRN

## 2012-12-10 MED ORDER — HYDROMORPHONE HCL PF 2 MG/ML IJ SOLN
2.0000 mg | Freq: Once | INTRAMUSCULAR | Status: AC
  Administered 2012-12-10: 2 mg via INTRAVENOUS
  Filled 2012-12-10: qty 1

## 2012-12-10 MED ORDER — HYDROCODONE-ACETAMINOPHEN 5-325 MG PO TABS
1.0000 | ORAL_TABLET | Freq: Once | ORAL | Status: AC
  Administered 2012-12-10: 1 via ORAL
  Filled 2012-12-10: qty 1

## 2012-12-10 MED ORDER — IBUPROFEN 200 MG PO TABS
400.0000 mg | ORAL_TABLET | Freq: Once | ORAL | Status: AC
  Administered 2012-12-10: 400 mg via ORAL
  Filled 2012-12-10: qty 2

## 2012-12-10 MED ORDER — ACETAMINOPHEN-CODEINE #3 300-30 MG PO TABS: 1.0000 | ORAL_TABLET | Freq: Four times a day (QID) | ORAL | Status: DC | PRN

## 2012-12-10 MED ORDER — POTASSIUM CHLORIDE CRYS ER 20 MEQ PO TBCR
40.0000 meq | EXTENDED_RELEASE_TABLET | Freq: Once | ORAL | Status: AC
  Administered 2012-12-10: 40 meq via ORAL
  Filled 2012-12-10: qty 2

## 2012-12-10 MED ORDER — IBUPROFEN 400 MG PO TABS: 400.0000 mg | ORAL_TABLET | Freq: Four times a day (QID) | ORAL | Status: DC | PRN

## 2012-12-10 NOTE — ED Provider Notes (Addendum)
History    CSN: [id number] Arrival date & time 12/10/12  9147  First MD Initiated Contact with Patient 12/10/12 0813     Chief Complaint  Patient presents with  . Hypertension  . Shoulder Pain   (Consider location/radiation/quality/duration/timing/severity/associated sxs/prior Treatment) HPI Comments: 77 y/o woman with hx of htn, dm comes in with cc of elevated bp and right shoulder pain. Pt had a shoulder steroid shot to the shoulder yday, and states that she has increased pain in the shoulder. Pain worse with ROM. Pt also has noticed that her pressure has been elevated. She has a mild headache associated with that, typical with her elevated BP in the past. Pt has no chest pain, dib, visual complains at this time. No new meds changes.  Patient is a 77 y.o. female presenting with hypertension and shoulder pain. The history is provided by the patient.  Hypertension Associated symptoms include headaches. Pertinent negatives include no chest pain, no abdominal pain and no shortness of breath.  Shoulder Pain Associated symptoms include headaches. Pertinent negatives include no chest pain, no abdominal pain and no shortness of breath.   Past Medical History  Diagnosis Date  . Hypertension   . Complication of anesthesia     WAKE UP DURING KNEE ARTHROSCOPY  . Kidney stones   . Headache(784.0)   . High cholesterol   . Asthma   . Chronic bronchitis     "get it qd" (02/20/2012)  . Sleep apnea     10 + 12 YRS NO MACHINE  . Type II diabetes mellitus   . Hypothyroidism     "used to take Synthroid; don't take anything now" (02/20/2012)  . GERD (gastroesophageal reflux disease)   . Lactose intolerance   . Arthritis     "full body" (02/20/2012)  . Fibromyalgia    Past Surgical History  Procedure Laterality Date  . Elbow surgery      bilateral; "left for fluid; right to get rid of nerve/muscles fusing themselves together" 02/20/2012  . Shoulder arthroscopy      RIGHT  . Knee  arthroscopy      BILATERAL   . Total knee arthroplasty  02/20/2012    left  . Tonsillectomy  1950's  . Appendectomy  1950's  . Abdominal hysterectomy  ~ 1979  . Cardiac catheterization    . Mastectomy complete / simple      "right; came back clean; simple"  . Breast biopsy      bilaterally  . Cystoscopy w/ ureteral stent placement  1970's?  . Cystoscopy w/ ureteral stent removal  1970's?  . Total knee arthroplasty  02/20/2012    Procedure: TOTAL KNEE ARTHROPLASTY;  Surgeon: Kennieth Rad, MD;  Location: [facility] OR;  Service: Orthopedics;  Laterality: Left;   No family history on file. History  Substance Use Topics  . Smoking status: Current Some Day Smoker -- 5 years    Types: Cigarettes  . Smokeless tobacco: Never Used     Comment: 02/20/2012 "once or twice/wk couple cigarettes each time"  . Alcohol Use: No   OB History   Grav Para Term Preterm Abortions TAB SAB Ect Mult Living                 Review of Systems  Constitutional: Negative for activity change.  HENT: Negative for neck pain.   Respiratory: Negative for shortness of breath.   Cardiovascular: Negative for chest pain.  Gastrointestinal: Negative for nausea, vomiting and abdominal pain.  Genitourinary: Negative  for dysuria.  Musculoskeletal: Positive for arthralgias.  Neurological: Positive for headaches.    Allergies  Penicillins; Tuberculin tests; Cephalexin; and Morphine and related  Home Medications   Current Outpatient Rx  Name  Route  Sig  Dispense  Refill  . furosemide (LASIX) 20 MG tablet   Oral   Take 20 mg by mouth daily as needed. For excess fluid         . lisinopril-hydrochlorothiazide (PRINZIDE,ZESTORETIC) 20-12.5 MG per tablet   Oral   Take 1 tablet by mouth daily.         . methocarbamol (ROBAXIN) 500 MG tablet   Oral   Take 1 tablet (500 mg total) by mouth every 6 (six) hours as needed.   60 tablet   1   . EXPIRED: albuterol (PROVENTIL HFA;VENTOLIN HFA) 108 (90 BASE) MCG/ACT  inhaler   Inhalation   Inhale 2 puffs into the lungs every 4 (four) hours as needed for wheezing.   1 Inhaler   0   . budesonide-formoterol (SYMBICORT) 160-4.5 MCG/ACT inhaler   Inhalation   Inhale 2 puffs into the lungs 2 (two) times daily.         Marland Kitchen estradiol (ESTRACE) 0.1 MG/GM vaginal cream   Vaginal   Place 2 g vaginally daily as needed.         . rosuvastatin (CRESTOR) 10 MG tablet   Oral   Take 10 mg by mouth daily.          BP 180/77  Pulse 86  Temp(Src) 98.5 F (36.9 C)  Resp 20  SpO2 97% Physical Exam  Nursing note and vitals reviewed. Constitutional: She is oriented to person, place, and time. She appears well-developed and well-nourished.  HENT:  Head: Normocephalic and atraumatic.  Eyes: EOM are normal. Pupils are equal, round, and reactive to light.  Neck: Neck supple. No JVD present.  Cardiovascular: Normal rate, regular rhythm and normal heart sounds.   No murmur heard. Pulmonary/Chest: Effort normal. No respiratory distress.  Abdominal: Soft. She exhibits no distension. There is no tenderness. There is no rebound and no guarding.  Musculoskeletal:  Right shoulder, no erythema, no callor. ROM compromised 2/2 pain.   Neurological: She is alert and oriented to person, place, and time.  Skin: Skin is warm and dry.    ED Course  Procedures (including critical care time) Labs Reviewed  BASIC METABOLIC PANEL  TROPONIN I   No results found. No diagnosis found.  MDM  Pt comes in with cc of elevated BP, shoulder pain.  Will get shoulder xray - as patient has a shoulder xray ordered by Ortho anyways. No concerns for infection at this time. Ice and pain meds.  We will also get BMP and troponin with the pressure elevation. If WNL, will request PCP f/u for med optimization.    Derwood Kaplan, MD 12/10/12 0856   Date: 12/10/2012  Rate: 67  Rhythm: normal sinus rhythm  QRS Axis: normal  Intervals: normal  ST/T Wave abnormalities: non  specific t wave changes  Conduction Disutrbances: 1st degree AV blocks  Narrative Interpretation: unremarkable      Derwood Kaplan, MD 12/10/12 0910  10:33 AM PT's lab results, EKG show no acute findings. She is on LIS-HCTZ 20/25 combo. I dont feel comfortable asking her to increase the dose, without PCP consent. She has been asked to see her PCP, Dr. Concepcion Elk for med optimization. Her BP elevated here, but she also has some pain issues.    Elishia Kaczorowski,  MD 12/10/12 1034

## 2012-12-10 NOTE — ED Notes (Signed)
Pt states that she has had high blood pressure for the past 2 days now, denies a headache at this time. States that she went to her ortho md yesterday and they gave her a shot for rt shoulder pain of cortisol and that she has had elevated bp since. Said that she has had these shots previous and this has not happened.

## 2012-12-10 NOTE — ED Notes (Signed)
Pt c/o rt shoulder pain x1wk, denies injury; pt states her b/p has been high since yesterday; pt c/o minor headache

## 2012-12-30 ENCOUNTER — Other Ambulatory Visit: Payer: Self-pay | Admitting: Orthopedic Surgery

## 2012-12-30 DIAGNOSIS — M25511 Pain in right shoulder: Secondary | ICD-10-CM

## 2013-01-06 ENCOUNTER — Other Ambulatory Visit: Payer: Medicare Other

## 2013-01-13 ENCOUNTER — Other Ambulatory Visit: Payer: Medicare Other

## 2013-01-14 ENCOUNTER — Emergency Department (HOSPITAL_COMMUNITY): Payer: Medicare Other

## 2013-01-14 ENCOUNTER — Encounter (HOSPITAL_COMMUNITY): Payer: Self-pay | Admitting: Emergency Medicine

## 2013-01-14 ENCOUNTER — Inpatient Hospital Stay: Admission: RE | Admit: 2013-01-14 | Payer: Medicare Other | Source: Ambulatory Visit

## 2013-01-14 ENCOUNTER — Inpatient Hospital Stay (HOSPITAL_COMMUNITY)
Admission: EM | Admit: 2013-01-14 | Discharge: 2013-01-18 | DRG: 184 | Disposition: A | Discharge status: Home Health | Payer: Medicare Other | Attending: General Surgery | Admitting: General Surgery

## 2013-01-14 DIAGNOSIS — Z79899 Other long term (current) drug therapy: Secondary | ICD-10-CM

## 2013-01-14 DIAGNOSIS — Z96659 Presence of unspecified artificial knee joint: Secondary | ICD-10-CM

## 2013-01-14 DIAGNOSIS — S2231XA Fracture of one rib, right side, initial encounter for closed fracture: Secondary | ICD-10-CM

## 2013-01-14 DIAGNOSIS — J45909 Unspecified asthma, uncomplicated: Secondary | ICD-10-CM | POA: Diagnosis present

## 2013-01-14 DIAGNOSIS — F17200 Nicotine dependence, unspecified, uncomplicated: Secondary | ICD-10-CM | POA: Diagnosis present

## 2013-01-14 DIAGNOSIS — S2220XA Unspecified fracture of sternum, initial encounter for closed fracture: Secondary | ICD-10-CM

## 2013-01-14 DIAGNOSIS — G4730 Sleep apnea, unspecified: Secondary | ICD-10-CM | POA: Diagnosis present

## 2013-01-14 DIAGNOSIS — S2241XA Multiple fractures of ribs, right side, initial encounter for closed fracture: Secondary | ICD-10-CM

## 2013-01-14 DIAGNOSIS — E7800 Pure hypercholesterolemia, unspecified: Secondary | ICD-10-CM | POA: Diagnosis present

## 2013-01-14 DIAGNOSIS — I10 Essential (primary) hypertension: Secondary | ICD-10-CM | POA: Diagnosis present

## 2013-01-14 DIAGNOSIS — K5900 Constipation, unspecified: Secondary | ICD-10-CM | POA: Diagnosis not present

## 2013-01-14 DIAGNOSIS — E119 Type 2 diabetes mellitus without complications: Secondary | ICD-10-CM | POA: Diagnosis present

## 2013-01-14 DIAGNOSIS — S2249XA Multiple fractures of ribs, unspecified side, initial encounter for closed fracture: Secondary | ICD-10-CM

## 2013-01-14 DIAGNOSIS — M129 Arthropathy, unspecified: Secondary | ICD-10-CM | POA: Diagnosis present

## 2013-01-14 DIAGNOSIS — IMO0001 Reserved for inherently not codable concepts without codable children: Secondary | ICD-10-CM | POA: Diagnosis present

## 2013-01-14 LAB — POCT I-STAT, CHEM 8
BUN: 22 mg/dL (ref 6–23)
Chloride: 108 mEq/L (ref 96–112)
Creatinine, Ser: 1.1 mg/dL (ref 0.50–1.10)
Glucose, Bld: 107 mg/dL — ABNORMAL HIGH (ref 70–99)
HCT: 40 % (ref 36.0–46.0)
Potassium: 3.6 mEq/L (ref 3.5–5.1)
Sodium: 143 mEq/L (ref 135–145)

## 2013-01-14 LAB — POCT I-STAT TROPONIN I: Troponin i, poc: 0.01 ng/mL (ref 0.00–0.08)

## 2013-01-14 MED ORDER — OXYCODONE-ACETAMINOPHEN 5-325 MG PO TABS
1.0000 | ORAL_TABLET | Freq: Once | ORAL | Status: AC
  Administered 2013-01-14: 1 via ORAL
  Filled 2013-01-14 (×2): qty 1

## 2013-01-14 MED ORDER — HYDROMORPHONE HCL PF 1 MG/ML IJ SOLN
1.0000 mg | Freq: Once | INTRAMUSCULAR | Status: AC
  Administered 2013-01-15: 1 mg via INTRAVENOUS
  Filled 2013-01-14: qty 1

## 2013-01-14 MED ORDER — SODIUM CHLORIDE 0.9 % IV SOLN
Freq: Once | INTRAVENOUS | Status: AC
  Administered 2013-01-14: 21:00:00 via INTRAVENOUS

## 2013-01-14 MED ORDER — IOHEXOL 300 MG/ML  SOLN
80.0000 mL | Freq: Once | INTRAMUSCULAR | Status: AC | PRN
  Administered 2013-01-14: 80 mL via INTRAVENOUS

## 2013-01-14 MED ORDER — HYDROMORPHONE HCL PF 1 MG/ML IJ SOLN
0.5000 mg | Freq: Once | INTRAMUSCULAR | Status: AC
  Administered 2013-01-14: 0.5 mg via INTRAVENOUS
  Filled 2013-01-14: qty 1
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
## 2013-01-14 NOTE — ED Notes (Signed)
Bed: WA22 Expected date:  Expected time:  Means of arrival:  Comments: 
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
## 2013-01-14 NOTE — ED Notes (Signed)
Patient transported to X-ray 

## 2013-01-15 ENCOUNTER — Inpatient Hospital Stay (HOSPITAL_COMMUNITY): Payer: Medicare Other

## 2013-01-15 DIAGNOSIS — S2241XA Multiple fractures of ribs, right side, initial encounter for closed fracture: Secondary | ICD-10-CM

## 2013-01-15 DIAGNOSIS — S2220XA Unspecified fracture of sternum, initial encounter for closed fracture: Secondary | ICD-10-CM

## 2013-01-15 LAB — CBC WITH DIFFERENTIAL/PLATELET
Basophils Relative: 0 % (ref 0–1)
HCT: 38.4 % (ref 36.0–46.0)
Hemoglobin: 13.3 g/dL (ref 12.0–15.0)
MCH: 31.4 pg (ref 26.0–34.0)
MCHC: 34.6 g/dL (ref 30.0–36.0)
MCV: 90.8 fL (ref 78.0–100.0)
Monocytes Absolute: 0.7 10*3/uL (ref 0.1–1.0)
Monocytes Relative: 8 % (ref 3–12)
Neutro Abs: 5.2 10*3/uL (ref 1.7–7.7)

## 2013-01-15 LAB — CBC
MCHC: 33.7 g/dL (ref 30.0–36.0)
MCV: 91.4 fL (ref 78.0–100.0)
Platelets: 179 10*3/uL (ref 150–400)
RDW: 13.4 % (ref 11.5–15.5)
WBC: 6.5 10*3/uL (ref 4.0–10.5)

## 2013-01-15 LAB — GLUCOSE, CAPILLARY
Glucose-Capillary: 108 mg/dL — ABNORMAL HIGH (ref 70–99)
Glucose-Capillary: 115 mg/dL — ABNORMAL HIGH (ref 70–99)
Glucose-Capillary: 131 mg/dL — ABNORMAL HIGH (ref 70–99)

## 2013-01-15 LAB — BASIC METABOLIC PANEL
BUN: 18 mg/dL (ref 6–23)
Calcium: 9 mg/dL (ref 8.4–10.5)
Chloride: 104 mEq/L (ref 96–112)
Creatinine, Ser: 0.92 mg/dL (ref 0.50–1.10)
GFR calc Af Amer: 69 mL/min — ABNORMAL LOW (ref >90)
GFR calc non Af Amer: 59 mL/min — ABNORMAL LOW (ref >90)

## 2013-01-15 MED ORDER — DOCUSATE SODIUM 100 MG PO CAPS
100.0000 mg | ORAL_CAPSULE | Freq: Two times a day (BID) | ORAL | Status: DC
  Administered 2013-01-15 – 2013-01-18 (×8): 100 mg via ORAL
  Filled 2013-01-15 (×9): qty 1

## 2013-01-15 MED ORDER — ONDANSETRON HCL 4 MG PO TABS: 4.0000 mg | ORAL_TABLET | Freq: Four times a day (QID) | ORAL | Status: DC | PRN

## 2013-01-15 MED ORDER — INSULIN ASPART 100 UNIT/ML ~~LOC~~ SOLN
0.0000 [IU] | Freq: Three times a day (TID) | SUBCUTANEOUS | Status: DC
  Administered 2013-01-15: 2 [IU] via SUBCUTANEOUS

## 2013-01-15 MED ORDER — TRAMADOL HCL 50 MG PO TABS
100.0000 mg | ORAL_TABLET | Freq: Four times a day (QID) | ORAL | Status: DC
  Administered 2013-01-15: 100 mg via ORAL
  Filled 2013-01-15: qty 2

## 2013-01-15 MED ORDER — HYDROMORPHONE HCL PF 1 MG/ML IJ SOLN
0.5000 mg | INTRAMUSCULAR | Status: DC | PRN
  Administered 2013-01-15 (×2): 1 mg via INTRAVENOUS
  Filled 2013-01-15 (×2): qty 1

## 2013-01-15 MED ORDER — HYDROCODONE-ACETAMINOPHEN 10-325 MG PO TABS
0.5000 | ORAL_TABLET | ORAL | Status: DC | PRN
  Administered 2013-01-15 – 2013-01-18 (×9): 2 via ORAL
  Filled 2013-01-15 (×9): qty 2

## 2013-01-15 MED ORDER — OXYCODONE HCL 5 MG PO TABS: 10.0000 mg | ORAL_TABLET | ORAL | Status: DC | PRN

## 2013-01-15 MED ORDER — METHOCARBAMOL 750 MG PO TABS
1500.0000 mg | ORAL_TABLET | Freq: Four times a day (QID) | ORAL | Status: DC
  Administered 2013-01-15 – 2013-01-18 (×11): 1500 mg via ORAL
  Filled 2013-01-15 (×12): qty 2
  Filled 2013-01-15: qty 1
  Filled 2013-01-15 (×6): qty 2

## 2013-01-15 MED ORDER — ALBUTEROL SULFATE HFA 108 (90 BASE) MCG/ACT IN AERS
2.0000 | INHALATION_SPRAY | Freq: Four times a day (QID) | RESPIRATORY_TRACT | Status: DC | PRN
  Administered 2013-01-18: 2 via RESPIRATORY_TRACT
  Filled 2013-01-15: qty 6.7

## 2013-01-15 MED ORDER — FUROSEMIDE 20 MG PO TABS
20.0000 mg | ORAL_TABLET | Freq: Every day | ORAL | Status: DC | PRN
  Filled 2013-01-15: qty 1

## 2013-01-15 MED ORDER — HYDROMORPHONE HCL PF 1 MG/ML IJ SOLN
0.5000 mg | INTRAMUSCULAR | Status: DC | PRN
  Administered 2013-01-15 – 2013-01-18 (×10): 0.5 mg via INTRAVENOUS
  Filled 2013-01-15 (×10): qty 1

## 2013-01-15 MED ORDER — IBUPROFEN 400 MG PO TABS
400.0000 mg | ORAL_TABLET | Freq: Four times a day (QID) | ORAL | Status: DC | PRN
  Filled 2013-01-15 (×2): qty 1

## 2013-01-15 MED ORDER — ONDANSETRON HCL 4 MG/2ML IJ SOLN: 4.0000 mg | Freq: Four times a day (QID) | INTRAMUSCULAR | Status: DC | PRN

## 2013-01-15 MED ORDER — OXYCODONE HCL 5 MG PO TABS: 5.0000 mg | ORAL_TABLET | ORAL | Status: DC | PRN

## 2013-01-15 MED ORDER — OXYCODONE HCL 5 MG PO TABS
10.0000 mg | ORAL_TABLET | ORAL | Status: DC | PRN
  Administered 2013-01-15: 10 mg via ORAL
  Filled 2013-01-15: qty 2

## 2013-01-15 MED ORDER — POLYETHYLENE GLYCOL 3350 17 G PO PACK
17.0000 g | PACK | Freq: Every day | ORAL | Status: DC
  Administered 2013-01-15 – 2013-01-18 (×3): 17 g via ORAL
  Filled 2013-01-15 (×5): qty 1

## 2013-01-15 MED ORDER — METHOCARBAMOL 500 MG PO TABS
500.0000 mg | ORAL_TABLET | Freq: Four times a day (QID) | ORAL | Status: DC | PRN
  Administered 2013-01-15: 500 mg via ORAL
  Filled 2013-01-15: qty 1

## 2013-01-15 MED ORDER — HYDROMORPHONE HCL PF 1 MG/ML IJ SOLN
1.0000 mg | INTRAMUSCULAR | Status: DC | PRN
  Administered 2013-01-15 (×2): 1 mg via INTRAVENOUS
  Filled 2013-01-15 (×2): qty 1

## 2013-01-15 MED ORDER — ENOXAPARIN SODIUM 30 MG/0.3ML ~~LOC~~ SOLN
30.0000 mg | Freq: Two times a day (BID) | SUBCUTANEOUS | Status: DC
  Administered 2013-01-15 – 2013-01-18 (×7): 30 mg via SUBCUTANEOUS
  Filled 2013-01-15 (×8): qty 0.3

## 2013-01-15 MED ORDER — CELECOXIB 200 MG PO CAPS
200.0000 mg | ORAL_CAPSULE | Freq: Two times a day (BID) | ORAL | Status: DC
  Administered 2013-01-15 – 2013-01-18 (×7): 200 mg via ORAL
  Filled 2013-01-15 (×9): qty 1

## 2013-01-15 MED ORDER — BUDESONIDE-FORMOTEROL FUMARATE 160-4.5 MCG/ACT IN AERO
2.0000 | INHALATION_SPRAY | Freq: Two times a day (BID) | RESPIRATORY_TRACT | Status: DC
  Administered 2013-01-16 – 2013-01-18 (×2): 2 via RESPIRATORY_TRACT
  Filled 2013-01-15 (×4): qty 6

## 2013-01-15 MED ORDER — SODIUM CHLORIDE 0.9 % IJ SOLN: 3.0000 mL | INTRAMUSCULAR | Status: DC | PRN

## 2013-01-15 MED ORDER — HYDROMORPHONE HCL PF 1 MG/ML IJ SOLN
1.0000 mg | Freq: Once | INTRAMUSCULAR | Status: AC
  Administered 2013-01-15: 1 mg via INTRAVENOUS
  Filled 2013-01-15: qty 1

## 2013-01-15 MED ORDER — LISINOPRIL 20 MG PO TABS
20.0000 mg | ORAL_TABLET | Freq: Every day | ORAL | Status: DC
  Administered 2013-01-15 – 2013-01-18 (×4): 20 mg via ORAL
  Filled 2013-01-15 (×4): qty 1

## 2013-01-15 MED ORDER — LISINOPRIL-HYDROCHLOROTHIAZIDE 20-12.5 MG PO TABS
1.0000 | ORAL_TABLET | Freq: Every day | ORAL | Status: DC
Start: 2013-01-15 — End: 2013-01-15

## 2013-01-15 MED ORDER — HYDROCHLOROTHIAZIDE 12.5 MG PO CAPS
12.5000 mg | ORAL_CAPSULE | Freq: Every day | ORAL | Status: DC
  Administered 2013-01-15 – 2013-01-18 (×4): 12.5 mg via ORAL
  Filled 2013-01-15 (×4): qty 1

## 2013-01-15 MED ORDER — TRAMADOL HCL 50 MG PO TABS
100.0000 mg | ORAL_TABLET | Freq: Three times a day (TID) | ORAL | Status: DC
  Administered 2013-01-15 – 2013-01-18 (×8): 100 mg via ORAL
  Filled 2013-01-15 (×8): qty 2
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
## 2013-01-15 NOTE — Progress Notes (Signed)
Lots of central chest and right sided chest pain.  Usually takes inhalers at home.  Will reorder.  This patient has been seen and I agree with the findings and treatment plan.  Marta Lamas. Gae Bon, MD, FACS [phone] (pager) [phone] (direct pager) Trauma Surgeon

## 2013-01-15 NOTE — Progress Notes (Signed)
UR completed 

## 2013-01-16 ENCOUNTER — Other Ambulatory Visit: Payer: Medicare Other

## 2013-01-16 LAB — GLUCOSE, CAPILLARY
Glucose-Capillary: 104 mg/dL — ABNORMAL HIGH (ref 70–99)
Glucose-Capillary: 128 mg/dL — ABNORMAL HIGH (ref 70–99)
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
## 2013-01-17 ENCOUNTER — Inpatient Hospital Stay (HOSPITAL_COMMUNITY): Payer: Medicare Other

## 2013-01-17 LAB — GLUCOSE, CAPILLARY
Glucose-Capillary: 100 mg/dL — ABNORMAL HIGH (ref 70–99)
Glucose-Capillary: 102 mg/dL — ABNORMAL HIGH (ref 70–99)

## 2013-01-17 MED ORDER — POLYETHYLENE GLYCOL 3350 17 G PO PACK
17.0000 g | PACK | Freq: Two times a day (BID) | ORAL | Status: DC
  Administered 2013-01-17 – 2013-01-18 (×2): 17 g via ORAL
  Filled 2013-01-17 (×4): qty 1
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
## 2013-01-18 MED ORDER — POLYETHYLENE GLYCOL 3350 17 G PO PACK: 17.0000 g | PACK | Freq: Every day | ORAL | Status: DC | PRN

## 2013-01-18 MED ORDER — TRAMADOL HCL 50 MG PO TABS: 100.0000 mg | ORAL_TABLET | Freq: Three times a day (TID) | ORAL | Status: DC

## 2013-01-18 MED ORDER — DSS 100 MG PO CAPS: 100.0000 mg | ORAL_CAPSULE | Freq: Two times a day (BID) | ORAL | Status: DC | PRN

## 2013-01-18 MED ORDER — HYDROCODONE-ACETAMINOPHEN 10-325 MG PO TABS: 0.5000 | ORAL_TABLET | ORAL | Status: DC | PRN

## 2013-01-18 MED ORDER — METHOCARBAMOL 750 MG PO TABS: 1500.0000 mg | ORAL_TABLET | Freq: Four times a day (QID) | ORAL | Status: DC

## 2013-01-18 MED ORDER — ALBUTEROL SULFATE HFA 108 (90 BASE) MCG/ACT IN AERS: 2.0000 | INHALATION_SPRAY | Freq: Four times a day (QID) | RESPIRATORY_TRACT | Status: DC | PRN
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
## 2013-01-18 NOTE — Progress Notes (Addendum)
Pt to have HHPT/OT upon discharge. She gave the address of her son and daughter-in-law as the address where she will be staying here in [place], [address] .  The mobile number listed in [employer] is the best number to use since she will not be at home. She gave an alternate phone number of [phone], Mechele Claude, pt's son. Pt prefers to use [facility] as she has used them in the past.  Pt reports already having a rollator for home so won't need DME.
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
## 2013-01-18 NOTE — Progress Notes (Signed)
Discharge instructions reviewed with pateint and family. Prescriptions given. All questions answered. Patient wheeled down with volunteer services.

## 2013-01-18 NOTE — Progress Notes (Signed)
Occupational Therapy Treatment Patient Details Name: Jean Aguilar MRN: [id number] DOB: January 26, 1936 Today's Date: 01/18/2013 Time: 4098-1191 OT Time Calculation (min): 20 min  OT Assessment / Plan / Recommendation       OT comments  Pt. Up ambulating in room with out rw upon arrival.  Educated on need to keep rw with her, she agreed.  Able to complete lb self care seated with no a/e needs.  Completed sim. Shower transfer yesterday but will need con't. Practice at d/c home with hhot., pt. Verbalized understanding of these recommendations  Follow Up Recommendations  Home health OT;Supervision/Assistance - 24 hour                      Frequency Min 2X/week   Progress towards OT Goals Progress towards OT goals: Progressing toward goals  Plan Discharge plan remains appropriate    Precautions / Restrictions Precautions Precautions: Fall   Pertinent Vitals/Pain R.n. Present upon arrival and providing pain meds for pt.    ADL  Lower Body Bathing: Simulated;Supervision/safety Lower Body Dressing: Simulated;Supervision/safety Where Assessed - Lower Body Dressing: Unsupported sitting Toilet Transfer: Simulated;Supervision/safety Toilet Transfer Method: Sit to stand Toileting - Clothing Manipulation and Hygiene: Simulated;Supervision/safety Tub/Shower Transfer: Simulated;Minimal assistance Tub/Shower Transfer Method: Ambulating Transfers/Ambulation Related to ADLs: s, with cues to use walker as pt. was found amb. in room with out it ADL Comments: able to sit in un supported sitting and cross l/r legs over to reach feet for don/doff lb garments, verbalized tech. for shower transfer but also agree for pt. to practice with hhot for tech., pt. cont. to hug pillow states it has become a "security " for her and does help with pain         OT Goals(current goals can now be found in the care plan section)    Visit Information  Last OT Received On: 01/18/13                  Cognition  Cognition Arousal/Alertness: Awake/alert Behavior During Therapy: [facility] for tasks assessed/performed Overall Cognitive Status: Within Functional Limits for tasks assessed    Mobility  Bed Mobility Bed Mobility: Sit to Supine Rolling Left: 5: Supervision Left Sidelying to Sit: With rails;HOB elevated Details for Bed Mobility Assistance: pt. requested leaving hob elevated for back to bed as she plans on being in a recliner most of the time so says she didn't feel that hob needed to be flat Transfers Transfers: Stand to Sit Stand to Sit: 5: Supervision;To bed               End of Session OT - End of Session Equipment Utilized During Treatment: Rolling walker Activity Tolerance: Patient tolerated treatment well Patient left: in bed;with call bell/phone within reach       Robet Leu, COTA/L 01/18/2013, 9:34 AM

## 2013-02-04 ENCOUNTER — Telehealth (HOSPITAL_COMMUNITY): Payer: Self-pay | Admitting: Emergency Medicine

## 2013-02-04 NOTE — Telephone Encounter (Signed)
appt cancelled for tomorrow May call to reschedule

## 2013-02-05 ENCOUNTER — Encounter (INDEPENDENT_AMBULATORY_CARE_PROVIDER_SITE_OTHER): Payer: Medicare Other

## 2013-03-09 ENCOUNTER — Other Ambulatory Visit (HOSPITAL_COMMUNITY): Payer: Self-pay | Admitting: Internal Medicine

## 2013-03-09 ENCOUNTER — Ambulatory Visit (HOSPITAL_COMMUNITY)
Admission: RE | Admit: 2013-03-09 | Discharge: 2013-03-09 | Disposition: A | Discharge status: Home / Self Care | Payer: Medicare Other | Source: Ambulatory Visit | Attending: Internal Medicine | Admitting: Internal Medicine

## 2013-03-09 DIAGNOSIS — X58XXXA Exposure to other specified factors, initial encounter: Secondary | ICD-10-CM | POA: Insufficient documentation

## 2013-03-09 DIAGNOSIS — S2242XB Multiple fractures of ribs, left side, initial encounter for open fracture: Secondary | ICD-10-CM

## 2013-03-09 DIAGNOSIS — S2239XA Fracture of one rib, unspecified side, initial encounter for closed fracture: Secondary | ICD-10-CM | POA: Insufficient documentation

## 2013-04-08 ENCOUNTER — Other Ambulatory Visit: Payer: Self-pay

## 2013-05-06 ENCOUNTER — Ambulatory Visit
Admission: RE | Admit: 2013-05-06 | Discharge: 2013-05-06 | Disposition: A | Discharge status: Home / Self Care | Payer: Medicare Other | Source: Ambulatory Visit | Attending: Orthopedic Surgery | Admitting: Orthopedic Surgery

## 2013-05-06 DIAGNOSIS — M25511 Pain in right shoulder: Secondary | ICD-10-CM

## 2013-06-21 ENCOUNTER — Other Ambulatory Visit: Payer: Self-pay | Admitting: Internal Medicine

## 2013-06-21 DIAGNOSIS — R109 Unspecified abdominal pain: Secondary | ICD-10-CM

## 2013-06-29 ENCOUNTER — Ambulatory Visit
Admission: RE | Admit: 2013-06-29 | Discharge: 2013-06-29 | Disposition: A | Discharge status: Home / Self Care | Payer: Medicare Other | Source: Ambulatory Visit | Attending: Internal Medicine | Admitting: Internal Medicine

## 2013-06-29 DIAGNOSIS — R109 Unspecified abdominal pain: Secondary | ICD-10-CM

## 2013-06-29 MED ORDER — IOHEXOL 300 MG/ML  SOLN
100.0000 mL | Freq: Once | INTRAMUSCULAR | Status: AC | PRN
  Administered 2013-06-29: 100 mL via INTRAVENOUS

## 2013-09-08 ENCOUNTER — Ambulatory Visit
Admission: RE | Admit: 2013-09-08 | Discharge: 2013-09-08 | Disposition: A | Discharge status: Home / Self Care | Payer: Medicare Other | Source: Ambulatory Visit | Attending: Orthopedic Surgery | Admitting: Orthopedic Surgery

## 2013-09-08 ENCOUNTER — Other Ambulatory Visit: Payer: Self-pay | Admitting: Orthopedic Surgery

## 2013-09-08 DIAGNOSIS — M169 Osteoarthritis of hip, unspecified: Secondary | ICD-10-CM

## 2013-12-04 ENCOUNTER — Encounter (HOSPITAL_COMMUNITY): Payer: Self-pay | Admitting: Emergency Medicine

## 2013-12-04 ENCOUNTER — Emergency Department (HOSPITAL_COMMUNITY)
Admission: EM | Admit: 2013-12-04 | Discharge: 2013-12-04 | Disposition: A | Discharge status: Home / Self Care | Payer: Medicare Other | Attending: Emergency Medicine | Admitting: Emergency Medicine

## 2013-12-04 ENCOUNTER — Emergency Department (HOSPITAL_COMMUNITY): Payer: Medicare Other

## 2013-12-04 DIAGNOSIS — E119 Type 2 diabetes mellitus without complications: Secondary | ICD-10-CM | POA: Insufficient documentation

## 2013-12-04 DIAGNOSIS — F17200 Nicotine dependence, unspecified, uncomplicated: Secondary | ICD-10-CM | POA: Insufficient documentation

## 2013-12-04 DIAGNOSIS — Z880 Allergy status to penicillin: Secondary | ICD-10-CM | POA: Insufficient documentation

## 2013-12-04 DIAGNOSIS — M25519 Pain in unspecified shoulder: Secondary | ICD-10-CM | POA: Insufficient documentation

## 2013-12-04 DIAGNOSIS — I10 Essential (primary) hypertension: Secondary | ICD-10-CM | POA: Insufficient documentation

## 2013-12-04 DIAGNOSIS — E7800 Pure hypercholesterolemia, unspecified: Secondary | ICD-10-CM | POA: Insufficient documentation

## 2013-12-04 DIAGNOSIS — M129 Arthropathy, unspecified: Secondary | ICD-10-CM | POA: Insufficient documentation

## 2013-12-04 DIAGNOSIS — Z8719 Personal history of other diseases of the digestive system: Secondary | ICD-10-CM | POA: Insufficient documentation

## 2013-12-04 DIAGNOSIS — R52 Pain, unspecified: Secondary | ICD-10-CM | POA: Insufficient documentation

## 2013-12-04 DIAGNOSIS — IMO0002 Reserved for concepts with insufficient information to code with codable children: Secondary | ICD-10-CM | POA: Insufficient documentation

## 2013-12-04 DIAGNOSIS — M25511 Pain in right shoulder: Secondary | ICD-10-CM

## 2013-12-04 DIAGNOSIS — Z79899 Other long term (current) drug therapy: Secondary | ICD-10-CM | POA: Insufficient documentation

## 2013-12-04 DIAGNOSIS — Z87442 Personal history of urinary calculi: Secondary | ICD-10-CM | POA: Insufficient documentation

## 2013-12-04 MED ORDER — OXYCODONE-ACETAMINOPHEN 5-325 MG PO TABS: 1.0000 | ORAL_TABLET | Freq: Four times a day (QID) | ORAL | Status: DC | PRN

## 2013-12-04 MED ORDER — OXYCODONE-ACETAMINOPHEN 5-325 MG PO TABS
2.0000 | ORAL_TABLET | Freq: Once | ORAL | Status: AC
  Administered 2013-12-04: 2 via ORAL
  Filled 2013-12-04: qty 2

## 2013-12-04 NOTE — ED Notes (Signed)
Pt ambulated to XRAY with steady gait 

## 2013-12-04 NOTE — ED Notes (Signed)
MD Linker at bedside.  

## 2013-12-04 NOTE — ED Notes (Signed)
Ice Pack Provided

## 2013-12-04 NOTE — ED Provider Notes (Signed)
CSN: [id number]     Arrival date & time 12/04/13  1456 History   First MD Initiated Contact with Patient 12/04/13 1514     Chief Complaint  Patient presents with  . Shoulder Pain     (Consider location/radiation/quality/duration/timing/severity/associated sxs/prior Treatment) HPI Pt presents with c/o right shoulder pain.  She states pain began last night.  She had no new injuries or activities.  She states she has arthritis but has not usually had pain in right shoulder.  Pain is worse with movement and palpation. No chest pain.  No numbness or weakness of arm.  No pain in neck.  Pt tried ice/heat, tried tramadol and vicodin without relief.  No swelling of arm.  There are no other associated systemic symptoms, there are no other alleviating or modifying factors.   Past Medical History  Diagnosis Date  . Hypertension   . Complication of anesthesia     WAKE UP DURING KNEE ARTHROSCOPY  . Kidney stones   . Headache(784.0)   . High cholesterol   . Asthma   . Chronic bronchitis     "get it qd" (02/20/2012)  . Sleep apnea     10 + 12 YRS NO MACHINE  . Type II diabetes mellitus   . Hypothyroidism     "used to take Synthroid; don't take anything now" (02/20/2012)  . GERD (gastroesophageal reflux disease)   . Lactose intolerance   . Arthritis     "full body" (02/20/2012)  . Fibromyalgia    Past Surgical History  Procedure Laterality Date  . Elbow surgery      bilateral; "left for fluid; right to get rid of nerve/muscles fusing themselves together" 02/20/2012  . Shoulder arthroscopy      RIGHT  . Knee arthroscopy      BILATERAL   . Total knee arthroplasty  02/20/2012    left  . Tonsillectomy  1950's  . Appendectomy  1950's  . Abdominal hysterectomy  ~ 1979  . Cardiac catheterization    . Mastectomy complete / simple      "right; came back clean; simple"  . Breast biopsy      bilaterally  . Cystoscopy w/ ureteral stent placement  1970's?  . Cystoscopy w/ ureteral stent removal   1970's?  . Total knee arthroplasty  02/20/2012    Procedure: TOTAL KNEE ARTHROPLASTY;  Surgeon: Kennieth RadArthur F Carter, MD;  [facility];  Service: Orthopedics;  Laterality: Left;   History reviewed. No pertinent family history. History  Substance Use Topics  . Smoking status: Current Some Day Smoker -- 5 years    Types: Cigarettes  . Smokeless tobacco: Never Used     Comment: 02/20/2012 "once or twice/wk couple cigarettes each time"  . Alcohol Use: No   OB History   Grav Para Term Preterm Abortions TAB SAB Ect Mult Living                 Review of Systems ROS reviewed and all otherwise negative except for mentioned in HPI    Allergies  Penicillins; Tuberculin tests; Cephalexin; and Morphine and related  Home Medications   Prior to Admission medications   Medication Sig Start Date End Date Taking? Authorizing Provider  albuterol (PROVENTIL HFA;VENTOLIN HFA) 108 (90 BASE) MCG/ACT inhaler Inhale 2 puffs into the lungs every 6 (six) hours as needed for wheezing or shortness of breath. 01/18/13  Yes Megan Dort, PA-C  budesonide-formoterol (SYMBICORT) 160-4.5 MCG/ACT inhaler Inhale 2 puffs into the lungs 2 (two) times  daily.   Yes Historical Provider, MD  furosemide (LASIX) 20 MG tablet Take 20 mg by mouth daily as needed. For excess fluid   Yes Historical Provider, MD  HYDROcodone-acetaminophen (NORCO) 10-325 MG per tablet Take 0.5-2 tablets by mouth every 4 (four) hours as needed (1/2 tablet for mild pain, 1 tablet for moderate pain, 2 tablets for severe pain). 01/18/13  Yes Megan Dort, PA-C  ibuprofen (ADVIL,MOTRIN) 200 MG tablet Take 400 mg by mouth every 6 (six) hours as needed for moderate pain.   Yes Historical Provider, MD  lisinopril-hydrochlorothiazide (PRINZIDE,ZESTORETIC) 20-12.5 MG per tablet Take 1 tablet by mouth daily.   Yes Historical Provider, MD  polyethylene glycol (MIRALAX / GLYCOLAX) packet Take 17 g by mouth daily as needed. 01/18/13  Yes Megan Dort, PA-C  rosuvastatin  (CRESTOR) 10 MG tablet Take 10 mg by mouth daily.   Yes Historical Provider, MD  traMADol (ULTRAM) 50 MG tablet Take 2 tablets (100 mg total) by mouth 3 (three) times daily. 01/18/13  Yes Megan Dort, PA-C  oxyCODONE-acetaminophen (PERCOCET/ROXICET) 5-325 MG per tablet Take 1-2 tablets by mouth every 6 (six) hours as needed for severe pain. 12/04/13   Ethelda ChickMartha K Linker, MD   BP 141/75  Pulse 65  Temp(Src) 98.5 F (36.9 C) (Oral)  Resp 16  SpO2 98% Vitals reviewed Physical Exam Physical Examination: General appearance - alert, well appearing, and in no distress Mental status - alert, oriented to person, place, and time Eyes - no conjunctival injection, no scleral icterus Chest - clear to auscultation, no wheezes, rales or rhonchi, symmetric air entry Heart - normal rate, regular rhythm, normal S1, S2, no murmurs, rubs, clicks or gallops Abdomen - soft, nontender, nondistended, no masses or organomegaly Back- no midline tenderness to palpation of c/t/l spine Neurological - alert, oriented, normal speech, no focal findings or movement disorder noted Musculoskeletal - ttp diffusely around right shoulder joint, pain with rom, no overlying ertyehma or warmth, no joint tenderness, deformity or swelling Extremities - peripheral pulses normal, no pedal edema, no clubbing or cyanosis Skin - normal coloration and turgor, no rashes  ED Course  Procedures (including critical care time) Labs Review Labs Reviewed - No data to display  Imaging Review Dg Shoulder Right  12/04/2013   CLINICAL DATA:  Right shoulder pain  EXAM: RIGHT SHOULDER - 2+ VIEW  COMPARISON:  MRI right shoulder dated 05/06/2013  FINDINGS: No fracture or dislocation is seen.  Mild degenerative changes of the glenohumeral and acromioclavicular joints.  Surgical clips in the right axilla.  Visualized right lung is clear.  IMPRESSION: No fracture or dislocation is seen.  Mild degenerative changes.   Electronically Signed   By: Charline BillsSriyesh   Krishnan M.D.   On: 12/04/2013 15:50     EKG Interpretation None      MDM   Final diagnoses:  Shoulder pain, acute, right    Pt presenting with c/o right shoulder pain.  Pain is worse with palpation and movement, also some ttp in upper back over trapezius distribution.  Xray shows some degenerative disease.  Feel this is most likely arthritis, doubt septic joint.  Pt treated with pain medications.  Advised f/u with orthopedics and her PMD.  Discharged with strict return precautions.  Pt agreeable with plan.    Ethelda ChickMartha K Linker, MD 12/04/13 [phone]

## 2013-12-04 NOTE — Discharge Instructions (Signed)
Return to the ED with any concerns including increased pain, swelling of arm, weakness of arm or hand, decreased level of alertness/lethargy, or any other alarming symptoms

## 2013-12-04 NOTE — ED Notes (Signed)
Pt reports R shoulder pain that started yesterday evening. Tried ice/heat and OTC pain medication without relief. Pt able to move arm. No sob, dizziness or chest pain.

## 2013-12-04 NOTE — ED Notes (Signed)
Pt requesting "Dilaudid"

## 2013-12-28 ENCOUNTER — Ambulatory Visit
Admission: RE | Admit: 2013-12-28 | Discharge: 2013-12-28 | Disposition: A | Discharge status: Home / Self Care | Payer: Medicare Other | Source: Ambulatory Visit | Attending: Orthopedic Surgery | Admitting: Orthopedic Surgery

## 2013-12-28 ENCOUNTER — Other Ambulatory Visit: Payer: Self-pay

## 2013-12-28 ENCOUNTER — Other Ambulatory Visit: Payer: Self-pay | Admitting: Orthopedic Surgery

## 2013-12-28 DIAGNOSIS — M5412 Radiculopathy, cervical region: Secondary | ICD-10-CM

## 2014-01-12 ENCOUNTER — Other Ambulatory Visit: Payer: Self-pay | Admitting: Orthopedic Surgery

## 2014-01-12 DIAGNOSIS — M542 Cervicalgia: Secondary | ICD-10-CM

## 2014-01-19 ENCOUNTER — Ambulatory Visit
Admission: RE | Admit: 2014-01-19 | Discharge: 2014-01-19 | Disposition: A | Discharge status: Home / Self Care | Payer: Medicare Other | Source: Ambulatory Visit | Attending: Orthopedic Surgery | Admitting: Orthopedic Surgery

## 2014-01-19 ENCOUNTER — Other Ambulatory Visit: Payer: Self-pay | Admitting: Orthopedic Surgery

## 2014-01-19 DIAGNOSIS — M19072 Primary osteoarthritis, left ankle and foot: Secondary | ICD-10-CM

## 2014-01-19 DIAGNOSIS — M79672 Pain in left foot: Secondary | ICD-10-CM

## 2014-01-19 DIAGNOSIS — M542 Cervicalgia: Secondary | ICD-10-CM

## 2014-03-08 ENCOUNTER — Other Ambulatory Visit: Payer: Self-pay | Admitting: Orthopedic Surgery

## 2014-03-08 DIAGNOSIS — M5412 Radiculopathy, cervical region: Secondary | ICD-10-CM

## 2014-03-08 DIAGNOSIS — M5030 Other cervical disc degeneration, unspecified cervical region: Secondary | ICD-10-CM

## 2014-03-17 ENCOUNTER — Inpatient Hospital Stay: Admission: RE | Admit: 2014-03-17 | Payer: Medicare Other | Source: Ambulatory Visit

## 2014-03-29 ENCOUNTER — Ambulatory Visit
Admission: RE | Admit: 2014-03-29 | Discharge: 2014-03-29 | Disposition: A | Discharge status: Home / Self Care | Payer: Medicare Other | Source: Ambulatory Visit | Attending: Orthopedic Surgery | Admitting: Orthopedic Surgery

## 2014-03-29 VITALS — BP 161/88 | HR 81

## 2014-03-29 DIAGNOSIS — M5412 Radiculopathy, cervical region: Secondary | ICD-10-CM

## 2014-03-29 DIAGNOSIS — M5030 Other cervical disc degeneration, unspecified cervical region: Secondary | ICD-10-CM

## 2014-03-29 MED ORDER — IOHEXOL 300 MG/ML  SOLN
1.0000 mL | Freq: Once | INTRAMUSCULAR | Status: AC | PRN
  Administered 2014-03-29: 1 mL via EPIDURAL

## 2014-03-29 MED ORDER — TRIAMCINOLONE ACETONIDE 40 MG/ML IJ SUSP (RADIOLOGY)
40.0000 mg | Freq: Once | INTRAMUSCULAR | Status: AC
  Administered 2014-03-29: 40 mg via EPIDURAL

## 2014-03-29 NOTE — Discharge Instructions (Signed)

## 2014-04-06 ENCOUNTER — Telehealth: Payer: Self-pay | Admitting: Radiology

## 2014-04-06 NOTE — Telephone Encounter (Signed)
Explained that sweating was not unheard of after epidural steroid injections but was something we did not see very often and that leg cramps were common place. Pt just wanted to check and see and will call back if further concerns arise.

## 2014-05-18 ENCOUNTER — Ambulatory Visit
Admission: RE | Admit: 2014-05-18 | Discharge: 2014-05-18 | Disposition: A | Discharge status: Home / Self Care | Payer: Medicare Other | Source: Ambulatory Visit | Attending: Orthopedic Surgery | Admitting: Orthopedic Surgery

## 2014-05-18 ENCOUNTER — Other Ambulatory Visit: Payer: Self-pay | Admitting: Orthopedic Surgery

## 2014-05-18 DIAGNOSIS — R52 Pain, unspecified: Secondary | ICD-10-CM

## 2014-05-24 ENCOUNTER — Other Ambulatory Visit: Payer: Self-pay | Admitting: Orthopedic Surgery

## 2014-05-24 DIAGNOSIS — M5136 Other intervertebral disc degeneration, lumbar region: Secondary | ICD-10-CM

## 2014-06-06 ENCOUNTER — Other Ambulatory Visit: Payer: Self-pay | Admitting: Orthopedic Surgery

## 2014-06-06 ENCOUNTER — Other Ambulatory Visit: Payer: Medicare Other

## 2014-06-07 ENCOUNTER — Other Ambulatory Visit: Payer: Medicare Other

## 2014-06-16 ENCOUNTER — Ambulatory Visit
Admission: RE | Admit: 2014-06-16 | Discharge: 2014-06-16 | Disposition: A | Discharge status: Home / Self Care | Payer: Medicare Other | Source: Ambulatory Visit | Attending: Orthopedic Surgery | Admitting: Orthopedic Surgery

## 2014-06-16 DIAGNOSIS — M5136 Other intervertebral disc degeneration, lumbar region: Secondary | ICD-10-CM

## 2014-10-13 ENCOUNTER — Ambulatory Visit: Payer: Medicare Other | Admitting: Neurology

## 2014-10-27 ENCOUNTER — Ambulatory Visit (INDEPENDENT_AMBULATORY_CARE_PROVIDER_SITE_OTHER): Payer: Medicare Other | Admitting: Neurology

## 2014-10-27 ENCOUNTER — Encounter: Payer: Self-pay | Admitting: Neurology

## 2014-10-27 VITALS — BP 138/76 | HR 86 | Resp 20 | Ht 63.39 in | Wt 203.0 lb

## 2014-10-27 DIAGNOSIS — F0150 Vascular dementia without behavioral disturbance: Secondary | ICD-10-CM | POA: Insufficient documentation

## 2014-10-27 DIAGNOSIS — G3184 Mild cognitive impairment, so stated: Secondary | ICD-10-CM | POA: Diagnosis not present

## 2014-10-27 HISTORY — DX: Vascular dementia, unspecified severity, without behavioral disturbance, psychotic disturbance, mood disturbance, and anxiety: F01.50

## 2014-10-27 NOTE — Patient Instructions (Signed)
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
## 2014-11-16 ENCOUNTER — Other Ambulatory Visit: Payer: Self-pay | Admitting: Internal Medicine

## 2014-11-16 DIAGNOSIS — N6012 Diffuse cystic mastopathy of left breast: Secondary | ICD-10-CM

## 2014-11-29 ENCOUNTER — Other Ambulatory Visit: Payer: Self-pay | Admitting: Orthopedic Surgery

## 2014-11-29 DIAGNOSIS — M4316 Spondylolisthesis, lumbar region: Secondary | ICD-10-CM

## 2014-12-07 ENCOUNTER — Ambulatory Visit
Admission: RE | Admit: 2014-12-07 | Discharge: 2014-12-07 | Disposition: A | Discharge status: Home / Self Care | Payer: Medicare Other | Source: Ambulatory Visit | Attending: Orthopedic Surgery | Admitting: Orthopedic Surgery

## 2014-12-07 DIAGNOSIS — M4316 Spondylolisthesis, lumbar region: Secondary | ICD-10-CM

## 2014-12-07 MED ORDER — METHYLPREDNISOLONE ACETATE 40 MG/ML INJ SUSP (RADIOLOG
120.0000 mg | Freq: Once | INTRAMUSCULAR | Status: AC
Start: 2014-12-07 — End: 2014-12-07
  Administered 2014-12-07: 120 mg via EPIDURAL

## 2014-12-07 MED ORDER — IOHEXOL 180 MG/ML  SOLN
1.0000 mL | Freq: Once | INTRAMUSCULAR | Status: AC | PRN
  Administered 2014-12-07: 1 mL via EPIDURAL

## 2014-12-07 NOTE — Discharge Instructions (Signed)

## 2014-12-12 ENCOUNTER — Encounter (HOSPITAL_COMMUNITY): Payer: Self-pay | Admitting: *Deleted

## 2014-12-12 ENCOUNTER — Observation Stay (HOSPITAL_COMMUNITY)
Admission: EM | Admit: 2014-12-12 | Discharge: 2014-12-14 | Disposition: A | Discharge status: Home / Self Care | Payer: Medicare Other | Attending: Internal Medicine | Admitting: Internal Medicine

## 2014-12-12 ENCOUNTER — Observation Stay (HOSPITAL_COMMUNITY): Payer: Medicare Other

## 2014-12-12 ENCOUNTER — Emergency Department (HOSPITAL_COMMUNITY): Payer: Medicare Other

## 2014-12-12 ENCOUNTER — Telehealth: Payer: Self-pay | Admitting: Radiology

## 2014-12-12 DIAGNOSIS — Z79899 Other long term (current) drug therapy: Secondary | ICD-10-CM | POA: Insufficient documentation

## 2014-12-12 DIAGNOSIS — M4316 Spondylolisthesis, lumbar region: Secondary | ICD-10-CM | POA: Diagnosis not present

## 2014-12-12 DIAGNOSIS — R51 Headache: Secondary | ICD-10-CM | POA: Diagnosis not present

## 2014-12-12 DIAGNOSIS — R739 Hyperglycemia, unspecified: Secondary | ICD-10-CM | POA: Diagnosis not present

## 2014-12-12 DIAGNOSIS — E739 Lactose intolerance, unspecified: Secondary | ICD-10-CM | POA: Diagnosis not present

## 2014-12-12 DIAGNOSIS — N179 Acute kidney failure, unspecified: Secondary | ICD-10-CM | POA: Diagnosis not present

## 2014-12-12 DIAGNOSIS — Z881 Allergy status to other antibiotic agents status: Secondary | ICD-10-CM | POA: Diagnosis not present

## 2014-12-12 DIAGNOSIS — G8929 Other chronic pain: Secondary | ICD-10-CM | POA: Diagnosis not present

## 2014-12-12 DIAGNOSIS — Z885 Allergy status to narcotic agent status: Secondary | ICD-10-CM | POA: Diagnosis not present

## 2014-12-12 DIAGNOSIS — I10 Essential (primary) hypertension: Secondary | ICD-10-CM | POA: Diagnosis not present

## 2014-12-12 DIAGNOSIS — F17210 Nicotine dependence, cigarettes, uncomplicated: Secondary | ICD-10-CM | POA: Diagnosis not present

## 2014-12-12 DIAGNOSIS — N189 Chronic kidney disease, unspecified: Secondary | ICD-10-CM

## 2014-12-12 DIAGNOSIS — E876 Hypokalemia: Secondary | ICD-10-CM | POA: Diagnosis not present

## 2014-12-12 DIAGNOSIS — M25551 Pain in right hip: Secondary | ICD-10-CM | POA: Diagnosis not present

## 2014-12-12 DIAGNOSIS — M797 Fibromyalgia: Secondary | ICD-10-CM | POA: Insufficient documentation

## 2014-12-12 DIAGNOSIS — J45909 Unspecified asthma, uncomplicated: Secondary | ICD-10-CM | POA: Insufficient documentation

## 2014-12-12 DIAGNOSIS — R4701 Aphasia: Secondary | ICD-10-CM | POA: Diagnosis present

## 2014-12-12 DIAGNOSIS — K219 Gastro-esophageal reflux disease without esophagitis: Secondary | ICD-10-CM | POA: Diagnosis not present

## 2014-12-12 DIAGNOSIS — G4730 Sleep apnea, unspecified: Secondary | ICD-10-CM | POA: Insufficient documentation

## 2014-12-12 DIAGNOSIS — Z880 Allergy status to penicillin: Secondary | ICD-10-CM | POA: Insufficient documentation

## 2014-12-12 DIAGNOSIS — I639 Cerebral infarction, unspecified: Secondary | ICD-10-CM | POA: Insufficient documentation

## 2014-12-12 DIAGNOSIS — E1165 Type 2 diabetes mellitus with hyperglycemia: Secondary | ICD-10-CM | POA: Diagnosis not present

## 2014-12-12 DIAGNOSIS — G459 Transient cerebral ischemic attack, unspecified: Secondary | ICD-10-CM | POA: Diagnosis not present

## 2014-12-12 DIAGNOSIS — E785 Hyperlipidemia, unspecified: Secondary | ICD-10-CM | POA: Insufficient documentation

## 2014-12-12 DIAGNOSIS — Z87442 Personal history of urinary calculi: Secondary | ICD-10-CM | POA: Insufficient documentation

## 2014-12-12 DIAGNOSIS — R4789 Other speech disturbances: Secondary | ICD-10-CM

## 2014-12-12 DIAGNOSIS — E039 Hypothyroidism, unspecified: Secondary | ICD-10-CM | POA: Diagnosis not present

## 2014-12-12 DIAGNOSIS — R269 Unspecified abnormalities of gait and mobility: Secondary | ICD-10-CM

## 2014-12-12 DIAGNOSIS — R531 Weakness: Secondary | ICD-10-CM

## 2014-12-12 LAB — DIFFERENTIAL
Basophils Absolute: 0 10*3/uL (ref 0.0–0.1)
Basophils Relative: 0 % (ref 0–1)
Eosinophils Absolute: 0.4 10*3/uL (ref 0.0–0.7)
Eosinophils Relative: 4 % (ref 0–5)
LYMPHS ABS: 3 10*3/uL (ref 0.7–4.0)
Lymphocytes Relative: 34 % (ref 12–46)
MONO ABS: 0.6 10*3/uL (ref 0.1–1.0)
Monocytes Relative: 6 % (ref 3–12)
Neutro Abs: 4.9 10*3/uL (ref 1.7–7.7)
Neutrophils Relative %: 56 % (ref 43–77)

## 2014-12-12 LAB — GLUCOSE, CAPILLARY: GLUCOSE-CAPILLARY: 135 mg/dL — AB (ref 65–99)

## 2014-12-12 LAB — COMPREHENSIVE METABOLIC PANEL
ALT: 9 U/L — ABNORMAL LOW (ref 14–54)
ANION GAP: 10 (ref 5–15)
AST: 12 U/L — AB (ref 15–41)
Albumin: 3.6 g/dL (ref 3.5–5.0)
Alkaline Phosphatase: 66 U/L (ref 38–126)
BILIRUBIN TOTAL: 0.4 mg/dL (ref 0.3–1.2)
BUN: 44 mg/dL — ABNORMAL HIGH (ref 6–20)
CALCIUM: 8.7 mg/dL — AB (ref 8.9–10.3)
CO2: 26 mmol/L (ref 22–32)
Chloride: 103 mmol/L (ref 101–111)
Creatinine, Ser: 1.78 mg/dL — ABNORMAL HIGH (ref 0.44–1.00)
GFR calc Af Amer: 31 mL/min — ABNORMAL LOW (ref >60)
GFR calc non Af Amer: 26 mL/min — ABNORMAL LOW (ref >60)
Glucose, Bld: 131 mg/dL — ABNORMAL HIGH (ref 65–99)
Potassium: 3.3 mmol/L — ABNORMAL LOW (ref 3.5–5.1)
Sodium: 139 mmol/L (ref 135–145)
TOTAL PROTEIN: 6.5 g/dL (ref 6.5–8.1)

## 2014-12-12 LAB — I-STAT CHEM 8, ED
BUN: 43 mg/dL — ABNORMAL HIGH (ref 6–20)
CHLORIDE: 101 mmol/L (ref 101–111)
Calcium, Ion: 1.07 mmol/L — ABNORMAL LOW (ref 1.13–1.30)
Creatinine, Ser: 1.7 mg/dL — ABNORMAL HIGH (ref 0.44–1.00)
GLUCOSE: 130 mg/dL — AB (ref 65–99)
HCT: 40 % (ref 36.0–46.0)
Hemoglobin: 13.6 g/dL (ref 12.0–15.0)
POTASSIUM: 3.1 mmol/L — AB (ref 3.5–5.1)
Sodium: 138 mmol/L (ref 135–145)
TCO2: 25 mmol/L (ref 0–100)

## 2014-12-12 LAB — URINALYSIS, ROUTINE W REFLEX MICROSCOPIC
GLUCOSE, UA: NEGATIVE mg/dL
Hgb urine dipstick: NEGATIVE
Ketones, ur: NEGATIVE mg/dL
Leukocytes, UA: NEGATIVE
Nitrite: NEGATIVE
Protein, ur: NEGATIVE mg/dL
Specific Gravity, Urine: 1.018 (ref 1.005–1.030)
Urobilinogen, UA: 0.2 mg/dL (ref 0.0–1.0)
pH: 5 (ref 5.0–8.0)

## 2014-12-12 LAB — CBC
HCT: 37.7 % (ref 36.0–46.0)
HEMOGLOBIN: 12.9 g/dL (ref 12.0–15.0)
MCH: 31.2 pg (ref 26.0–34.0)
MCHC: 34.2 g/dL (ref 30.0–36.0)
MCV: 91.3 fL (ref 78.0–100.0)
Platelets: 244 10*3/uL (ref 150–400)
RBC: 4.13 MIL/uL (ref 3.87–5.11)
RDW: 13.2 % (ref 11.5–15.5)
WBC: 8.9 10*3/uL (ref 4.0–10.5)

## 2014-12-12 LAB — APTT: aPTT: 28 seconds (ref 24–37)

## 2014-12-12 LAB — CBG MONITORING, ED: Glucose-Capillary: 135 mg/dL — ABNORMAL HIGH (ref 65–99)

## 2014-12-12 LAB — PROTIME-INR
INR: 0.99 (ref 0.00–1.49)
Prothrombin Time: 13.3 seconds (ref 11.6–15.2)

## 2014-12-12 LAB — I-STAT TROPONIN, ED: Troponin i, poc: 0.01 ng/mL (ref 0.00–0.08)

## 2014-12-12 MED ORDER — HEPARIN SODIUM (PORCINE) 5000 UNIT/ML IJ SOLN
5000.0000 [IU] | Freq: Three times a day (TID) | INTRAMUSCULAR | Status: DC
  Administered 2014-12-12 – 2014-12-14 (×6): 5000 [IU] via SUBCUTANEOUS
  Filled 2014-12-12 (×7): qty 1

## 2014-12-12 MED ORDER — ALBUTEROL SULFATE (2.5 MG/3ML) 0.083% IN NEBU: 2.5000 mg | INHALATION_SOLUTION | Freq: Four times a day (QID) | RESPIRATORY_TRACT | Status: DC | PRN

## 2014-12-12 MED ORDER — STROKE: EARLY STAGES OF RECOVERY BOOK
Freq: Once | Status: AC
  Administered 2014-12-12: 19:00:00

## 2014-12-12 MED ORDER — HYDROMORPHONE HCL 1 MG/ML IJ SOLN
0.5000 mg | Freq: Once | INTRAMUSCULAR | Status: AC
  Administered 2014-12-12: 0.5 mg via INTRAVENOUS
  Filled 2014-12-12: qty 1

## 2014-12-12 MED ORDER — ONDANSETRON HCL 4 MG/2ML IJ SOLN
4.0000 mg | Freq: Once | INTRAMUSCULAR | Status: AC
  Administered 2014-12-12: 4 mg via INTRAVENOUS
  Filled 2014-12-12: qty 2

## 2014-12-12 MED ORDER — OXYCODONE-ACETAMINOPHEN 5-325 MG PO TABS
1.0000 | ORAL_TABLET | Freq: Four times a day (QID) | ORAL | Status: DC | PRN
  Administered 2014-12-12 – 2014-12-14 (×6): 2 via ORAL
  Filled 2014-12-12 (×6): qty 2

## 2014-12-12 MED ORDER — FUROSEMIDE 20 MG PO TABS
20.0000 mg | ORAL_TABLET | Freq: Every day | ORAL | Status: DC | PRN
Start: 2014-12-12 — End: 2014-12-13

## 2014-12-12 MED ORDER — ROSUVASTATIN CALCIUM 10 MG PO TABS
10.0000 mg | ORAL_TABLET | Freq: Every day | ORAL | Status: DC
  Administered 2014-12-13 – 2014-12-14 (×2): 10 mg via ORAL
  Filled 2014-12-12 (×2): qty 1

## 2014-12-12 MED ORDER — BUDESONIDE-FORMOTEROL FUMARATE 160-4.5 MCG/ACT IN AERO
2.0000 | INHALATION_SPRAY | Freq: Two times a day (BID) | RESPIRATORY_TRACT | Status: DC
  Filled 2014-12-12: qty 6

## 2014-12-12 MED ORDER — BUDESONIDE-FORMOTEROL FUMARATE 160-4.5 MCG/ACT IN AERO
2.0000 | INHALATION_SPRAY | Freq: Two times a day (BID) | RESPIRATORY_TRACT | Status: DC | PRN
  Filled 2014-12-12: qty 6

## 2014-12-12 NOTE — ED Notes (Addendum)
PT states yesterday could not walk and was stumbling all over the place that started around the middle of the day and continues today.  PT states she had problems talking yesterday.  Pt states she had an epidural injection last week

## 2014-12-12 NOTE — ED Provider Notes (Signed)
The patient is a 79 year old female, she presents with 2 complaints, one is word finding difficulties and the second is a feeling of imbalance. She has had difficulty walking around her small apartment home. On exam the patient has normal strength in all 4 extremities though she is restricted in her right lower extremity secondary to right lower back pain. This is a chronic problem. She has normal finger-nose-finger, normal heel shin, she has difficulty standing up because of imbalance and has some difficulty with word finding all she is speaking. Otherwise the cranial nerves III through XII appear normal. On exam she has clear heart and lung sounds, soft abdomen, no peripheral edema. Labs and CT scan reviewed, no acute findings, EKG unremarkable and unchanged, patient will likely need admission and she is probably had a small stroke.  Medical screening examination/treatment/procedure(s) were conducted as a shared visit with non-physician practitioner(s) and myself.  I personally evaluated the patient during the encounter.  Clinical Impression:   Final diagnoses:  Expressive aphasia  Gait abnormality     EKG Interpretation  Date/Time:  Monday December 12 2014 12:40:37 EDT Ventricular Rate:  85 PR Interval:  208 QRS Duration: 74 QT Interval:  400 QTC Calculation: 476 R Axis:   5 Text Interpretation:  Sinus rhythm with occasional Premature ventricular complexes and Premature atrial complexes Inferior infarct , age undetermined Cannot rule out Anterior infarct , age undetermined Abnormal ECG since last tracing no significant change Confirmed by Hyacinth MeekerMILLER  MD, Dymphna Wadley ([id number]) on 12/12/2014 3:59:07 PM        Eber HongBrian Oniyah Rohe, MD 12/13/14 [phone]

## 2014-12-12 NOTE — ED Provider Notes (Signed)
CSN: [id number]     Arrival date & time 12/12/14  1225 History   First MD Initiated Contact with Patient 12/12/14 1533     Chief Complaint  Patient presents with  . Stroke Symptoms     (Consider location/radiation/quality/duration/timing/severity/associated sxs/prior Treatment) HPI Comments: Patient with history of diabetes, high cholesterol, high blood pressure, no previous stroke history -- presents with complaint of altered gait and difficulty speaking for the past 24 hours. Patient states that she has been "walking like a drunk person". She typically uses a cane to walk. She has chronic back pain with pre-existing numbness and tingling in her lower extremities. No falls or head injuries. She is having difficulty finding words at times becomes frustrated when she is having trouble speaking. She denies facial droop. In the past several weeks she reports some tingling on the left side of her face. No unilateral weakness. She reports neck pain and headache which is intermittent and unrelated to symptoms. Patient called her doctor's office today and was referred directly to the emergency department for evaluation for stroke. Patient's son has had strokes in the past, otherwise no strong family history. No history of anticoagulation or antiplatelets medications. No history of irregular heartbeat or atrial fibrillation. The onset of this condition was acute. The course is constant. Aggravating factors: none. Alleviating factors: none.    The history is provided by the patient.    Past Medical History  Diagnosis Date  . Hypertension   . Complication of anesthesia     WAKE UP DURING KNEE ARTHROSCOPY  . Kidney stones   . Headache(784.0)   . High cholesterol   . Asthma   . Chronic bronchitis     "get it qd" (02/20/2012)  . Sleep apnea     10 + 12 YRS NO MACHINE  . Type II diabetes mellitus   . Hypothyroidism     "used to take Synthroid; don't take anything now" (02/20/2012)  . GERD  (gastroesophageal reflux disease)   . Lactose intolerance   . Arthritis     "full body" (02/20/2012)  . Fibromyalgia   . Hemorrhoids   . Fibromyalgia   . Cervical spondylosis with myelopathy   . Carotid stenosis   . Irritable colon   . Asthma   . HLD (hyperlipidemia)    Past Surgical History  Procedure Laterality Date  . Elbow surgery      bilateral; "left for fluid; right to get rid of nerve/muscles fusing themselves together" 02/20/2012  . Shoulder arthroscopy      RIGHT  . Knee arthroscopy      BILATERAL   . Total knee arthroplasty  02/20/2012    left  . Tonsillectomy  1950's  . Appendectomy  1950's  . Abdominal hysterectomy  ~ 1979  . Cardiac catheterization    . Mastectomy complete / simple      "right; came back clean; simple"  . Breast biopsy      bilaterally  . Cystoscopy w/ ureteral stent placement  1970's?  . Cystoscopy w/ ureteral stent removal  1970's?  . Total knee arthroplasty  02/20/2012    Procedure: TOTAL KNEE ARTHROPLASTY;  Surgeon: Kennieth Rad, MD;  Location: [facility] OR;  Service: Orthopedics;  Laterality: Left;   Family History  Problem Relation Age of Onset  . Alzheimer's disease Mother    History  Substance Use Topics  . Smoking status: Current Some Day Smoker -- 5 years    Types: Cigarettes  . Smokeless tobacco: Never Used  Comment: 02/20/2012 "once or twice/wk couple cigarettes each time"  . Alcohol Use: No   OB History    No data available     Review of Systems  Constitutional: Negative for fever and chills.  HENT: Negative for congestion, dental problem, rhinorrhea and sinus pressure.   Eyes: Negative for photophobia, discharge, redness and visual disturbance.  Respiratory: Negative for shortness of breath.   Cardiovascular: Negative for chest pain.  Gastrointestinal: Negative for nausea and vomiting.  Musculoskeletal: Positive for gait problem and neck pain. Negative for neck stiffness.  Skin: Negative for rash.  Neurological:  Positive for speech difficulty and headaches. Negative for syncope, weakness, light-headedness and numbness.  Psychiatric/Behavioral: Negative for confusion.      Allergies  Cephalexin; Morphine and related; Penicillins; and Tuberculin tests  Home Medications   Prior to Admission medications   Medication Sig Start Date End Date Taking? Authorizing Provider  albuterol (PROVENTIL HFA;VENTOLIN HFA) 108 (90 BASE) MCG/ACT inhaler Inhale 2 puffs into the lungs every 6 (six) hours as needed for wheezing or shortness of breath. 01/18/13   Nonie Hoyer, PA-C  budesonide-formoterol (SYMBICORT) 160-4.5 MCG/ACT inhaler Inhale 2 puffs into the lungs 2 (two) times daily.    Historical Provider, MD  furosemide (LASIX) 20 MG tablet Take 20 mg by mouth daily as needed. For excess fluid    Historical Provider, MD  HYDROcodone-acetaminophen (NORCO) 10-325 MG per tablet Take 0.5-2 tablets by mouth every 4 (four) hours as needed (1/2 tablet for mild pain, 1 tablet for moderate pain, 2 tablets for severe pain). 01/18/13   Nonie Hoyer, PA-C  ibuprofen (ADVIL,MOTRIN) 200 MG tablet Take 400 mg by mouth every 6 (six) hours as needed for moderate pain.    Historical Provider, MD  lisinopril-hydrochlorothiazide (PRINZIDE,ZESTORETIC) 20-12.5 MG per tablet Take 1 tablet by mouth daily.    Historical Provider, MD  oxyCODONE-acetaminophen (PERCOCET/ROXICET) 5-325 MG per tablet Take 1-2 tablets by mouth every 6 (six) hours as needed for severe pain. 12/04/13   Jerelyn Scott, MD  polyethylene glycol (MIRALAX / GLYCOLAX) packet Take 17 g by mouth daily as needed. 01/18/13   Nonie Hoyer, PA-C  rosuvastatin (CRESTOR) 10 MG tablet Take 10 mg by mouth daily.    Historical Provider, MD  traMADol (ULTRAM) 50 MG tablet Take 2 tablets (100 mg total) by mouth 3 (three) times daily. 01/18/13   Megan N Baird, PA-C   BP 129/66 mmHg  Pulse 85  Temp(Src) 99 F (37.2 C) (Oral)  Resp 18  SpO2 97%    Physical Exam  Constitutional: She  is oriented to person, place, and time. She appears well-developed and well-nourished.  HENT:  Head: Normocephalic and atraumatic.  Right Ear: Tympanic membrane, external ear and ear canal normal.  Left Ear: Tympanic membrane, external ear and ear canal normal.  Nose: Nose normal.  Mouth/Throat: Uvula is midline, oropharynx is clear and moist and mucous membranes are normal.  Eyes: Conjunctivae, EOM and lids are normal. Pupils are equal, round, and reactive to light. Right eye exhibits no nystagmus. Left eye exhibits no nystagmus.  Neck: Normal range of motion. Neck supple.  Cardiovascular: Normal rate and regular rhythm.   Pulmonary/Chest: Effort normal and breath sounds normal.  Abdominal: Soft. There is no tenderness.  Musculoskeletal:       Cervical back: She exhibits normal range of motion, no tenderness and no bony tenderness.  Neurological: She is alert and oriented to person, place, and time. She has normal strength and normal reflexes.  No cranial nerve deficit or sensory deficit. Coordination normal. GCS eye subscore is 4. GCS verbal subscore is 5. GCS motor subscore is 6.  Positive Romberg -- very unsteady.  Skin: Skin is warm and dry.  Psychiatric: She has a normal mood and affect.  Nursing note and vitals reviewed.   ED Course  Procedures (including critical care time) Labs Review Labs Reviewed  COMPREHENSIVE METABOLIC PANEL - Abnormal; Notable for the following:    Potassium 3.3 (*)    Glucose, Bld 131 (*)    BUN 44 (*)    Creatinine, Ser 1.78 (*)    Calcium 8.7 (*)    AST 12 (*)    ALT 9 (*)    GFR calc non Af Amer 26 (*)    GFR calc Af Amer 31 (*)    All other components within normal limits  URINALYSIS, ROUTINE W REFLEX MICROSCOPIC (NOT AT [facility]) - Abnormal; Notable for the following:    Bilirubin Urine SMALL (*)    All other components within normal limits  CBG MONITORING, ED - Abnormal; Notable for the following:    Glucose-Capillary 135 (*)    All other  components within normal limits  I-STAT CHEM 8, ED - Abnormal; Notable for the following:    Potassium 3.1 (*)    BUN 43 (*)    Creatinine, Ser 1.70 (*)    Glucose, Bld 130 (*)    Calcium, Ion 1.07 (*)    All other components within normal limits  PROTIME-INR  APTT  CBC  DIFFERENTIAL  I-STAT TROPOININ, ED    Imaging Review Ct Head Wo Contrast  12/12/2014   CLINICAL DATA:  Difficulty ambulating  EXAM: CT HEAD WITHOUT CONTRAST  TECHNIQUE: Contiguous axial images were obtained from the base of the skull through the vertex without intravenous contrast.  COMPARISON:  None.  FINDINGS: The bony calvarium is intact. The ventricles are of normal size and configuration. No findings to suggest acute hemorrhage, acute infarction or space-occupying mass lesion are noted.  IMPRESSION: No acute intracranial abnormality pre   Electronically Signed   By: Alcide CleverMark  Lukens M.D.   On: 12/12/2014 14:56     EKG Interpretation   Date/Time:  Monday December 12 2014 12:40:37 EDT Ventricular Rate:  85 PR Interval:  208 QRS Duration: 74 QT Interval:  400 QTC Calculation: 476 R Axis:   5 Text Interpretation:  Sinus rhythm with occasional Premature ventricular  complexes and Premature atrial complexes Inferior infarct , age  undetermined Cannot rule out Anterior infarct , age undetermined Abnormal  ECG since last tracing no significant change Confirmed by MILLER  MD,  BRIAN ([id number]) on 12/12/2014 3:59:07 PM       4:03 PM Patient seen and examined. Work-up initiated. Medications ordered. Discussed with Dr. Hyacinth MeekerMiller. Will need admission for stroke evaluation.   Vital signs reviewed and are as follows: BP 129/66 mmHg  Pulse 85  Temp(Src) 99 F (37.2 C) (Oral)  Resp 18  SpO2 97%  4:56 PM No UTI. Spoke with Dr. Benjamine MolaVann who will admit.   MDM   Final diagnoses:  Expressive aphasia  Gait abnormality   Admit, stroke-like symptoms, increased creatinine of uncertain chronicity.     Renne CriglerJoshua Jennessy Sandridge, PA-C 12/12/14  1658  Eber HongBrian Miller, MD 12/13/14 [phone]

## 2014-12-12 NOTE — ED Notes (Signed)
Pt walked to bathroom with no assistance. Pt tolerated well

## 2014-12-12 NOTE — Telephone Encounter (Signed)
Pt had injection on December 07, 2014. States that she started stumbling around the floor and had trouble talking for the past 2 days. Explained these could be signs of stroke and she needed to see her Dr. right away.  Also, explained her increased sugar was from the steroid and it would start to come down soon. Reinforced need for her to see Dr. As soon as possible for her other symptoms.

## 2014-12-12 NOTE — H&P (Signed)
[facility] History and Physical  Jean Aguilar ZHY:[id number] DOB: Aug 09, 1935 DOA: 12/12/2014  Referring physician: er PCP: Dorrene German, MD   Chief Complaint:   HPI: Jean Aguilar is a 79 y.o. female  With PMHx of back pain, HTN, and HLD.  She developed yesterday at noon a gait abnormality (walking like a drunk person) and word finding difficulties (knew what she wanted to say but could not get it out), she ignored it but when she was not improved today, came to the ER.  Patient walks with a cane at home occasionally.   Patient had an injection in her right hip on July 6th. In the ER, her Cr was found to be elevated above her  Baseline.  CT scan of brain was normal.  I observed patient walking to the bathroom with minimal assitance from tech with a cane   Review of Systems:  All systems reviewed, negative unless stated above    Past Medical History  Diagnosis Date  . Hypertension   . Complication of anesthesia     WAKE UP DURING KNEE ARTHROSCOPY  . Kidney stones   . Headache(784.0)   . High cholesterol   . Asthma   . Chronic bronchitis     "get it qd" (02/20/2012)  . Sleep apnea     10 + 12 YRS NO MACHINE  . Type II diabetes mellitus   . Hypothyroidism     "used to take Synthroid; don't take anything now" (02/20/2012)  . GERD (gastroesophageal reflux disease)   . Lactose intolerance   . Arthritis     "full body" (02/20/2012)  . Fibromyalgia   . Hemorrhoids   . Fibromyalgia   . Cervical spondylosis with myelopathy   . Carotid stenosis   . Irritable colon   . Asthma   . HLD (hyperlipidemia)    Past Surgical History  Procedure Laterality Date  . Elbow surgery      bilateral; "left for fluid; right to get rid of nerve/muscles fusing themselves together" 02/20/2012  . Shoulder arthroscopy      RIGHT  . Knee arthroscopy      BILATERAL   . Total knee arthroplasty  02/20/2012    left  . Tonsillectomy  1950's  . Appendectomy  1950's  . Abdominal  hysterectomy  ~ 1979  . Cardiac catheterization    . Mastectomy complete / simple      "right; came back clean; simple"  . Breast biopsy      bilaterally  . Cystoscopy w/ ureteral stent placement  1970's?  . Cystoscopy w/ ureteral stent removal  1970's?  . Total knee arthroplasty  02/20/2012    Procedure: TOTAL KNEE ARTHROPLASTY;  Surgeon: Kennieth Rad, MD;  Location: [facility] OR;  Service: Orthopedics;  Laterality: Left;   Social History:  reports that she has been smoking Cigarettes.  She has smoked for the past 5 years. She has never used smokeless tobacco. She reports that she does not drink alcohol or use illicit drugs.  Allergies  Allergen Reactions  . Cephalexin Rash  . Morphine And Related Other (See Comments)    headache  . Penicillins Rash    IV form- causes skin discoloration and swelling at site  . Tuberculin Tests Other (See Comments)    Reaction at site, using chest x-rays instead    Family History  Problem Relation Age of Onset  . Alzheimer's disease Mother     Prior to Admission medications   Medication Sig Start  Date End Date Taking? Authorizing Provider  albuterol (PROVENTIL HFA;VENTOLIN HFA) 108 (90 BASE) MCG/ACT inhaler Inhale 2 puffs into the lungs every 6 (six) hours as needed for wheezing or shortness of breath. 01/18/13   Nonie HoyerMegan N Baird, PA-C  budesonide-formoterol (SYMBICORT) 160-4.5 MCG/ACT inhaler Inhale 2 puffs into the lungs 2 (two) times daily.    Historical Provider, MD  furosemide (LASIX) 20 MG tablet Take 20 mg by mouth daily as needed. For excess fluid    Historical Provider, MD  HYDROcodone-acetaminophen (NORCO) 10-325 MG per tablet Take 0.5-2 tablets by mouth every 4 (four) hours as needed (1/2 tablet for mild pain, 1 tablet for moderate pain, 2 tablets for severe pain). 01/18/13   Nonie HoyerMegan N Baird, PA-C  ibuprofen (ADVIL,MOTRIN) 200 MG tablet Take 400 mg by mouth every 6 (six) hours as needed for moderate pain.    Historical Provider, MD    lisinopril-hydrochlorothiazide (PRINZIDE,ZESTORETIC) 20-12.5 MG per tablet Take 1 tablet by mouth daily.    Historical Provider, MD  oxyCODONE-acetaminophen (PERCOCET/ROXICET) 5-325 MG per tablet Take 1-2 tablets by mouth every 6 (six) hours as needed for severe pain. 12/04/13   Jerelyn ScottMartha Linker, MD  polyethylene glycol (MIRALAX / GLYCOLAX) packet Take 17 g by mouth daily as needed. 01/18/13   Nonie HoyerMegan N Baird, PA-C  rosuvastatin (CRESTOR) 10 MG tablet Take 10 mg by mouth daily.    Historical Provider, MD  traMADol (ULTRAM) 50 MG tablet Take 2 tablets (100 mg total) by mouth 3 (three) times daily. 01/18/13   Nonie HoyerMegan N Baird, PA-C   Physical Exam: Filed Vitals:   12/12/14 1232 12/12/14 1620  BP: 129/66 139/53  Pulse: 85 67  Temp: 99 F (37.2 C)   TempSrc: Oral   Resp: 18 14  SpO2: 97% 100%    Wt Readings from Last 3 Encounters:  10/27/14 92.08 kg (203 lb)  01/15/13 89.4 kg (197 lb 1.5 oz)  02/24/12 89.4 kg (197 lb 1.5 oz)    General:  Appears calm and comfortable, laughing/joking Eyes: PERRL, normal lids, irises & conjunctiva ENT: grossly normal hearing, lips & tongue Neck: no LAD, masses or thyromegaly Cardiovascular: RRR, no m/r/g. No LE edema. Respiratory: diminished, no w/r/r. Normal respiratory effort. Abdomen: soft, ntnd Skin: no rash or induration seen on limited exam Musculoskeletal: grossly normal tone BUE/BLE Psychiatric: grossly normal mood and affect, speech fluent and appropriate Neurologic: equal strength but mild word finding difficulties          Labs on Admission:  Basic Metabolic Panel:  Recent Labs Lab 12/12/14 1305 12/12/14 1309  NA 139 138  K 3.3* 3.1*  CL 103 101  CO2 26  --   GLUCOSE 131* 130*  BUN 44* 43*  CREATININE 1.78* 1.70*  CALCIUM 8.7*  --    Liver Function Tests:  Recent Labs Lab 12/12/14 1305  AST 12*  ALT 9*  ALKPHOS 66  BILITOT 0.4  PROT 6.5  ALBUMIN 3.6   No results for input(s): LIPASE, AMYLASE in the last 168 hours. No  results for input(s): AMMONIA in the last 168 hours. CBC:  Recent Labs Lab 12/12/14 1305 12/12/14 1309  WBC 8.9  --   NEUTROABS 4.9  --   HGB 12.9 13.6  HCT 37.7 40.0  MCV 91.3  --   PLT 244  --    Cardiac Enzymes: No results for input(s): CKTOTAL, CKMB, CKMBINDEX, TROPONINI in the last 168 hours.  BNP (last 3 results) No results for input(s): BNP in the last 8760 hours.  ProBNP (last 3 results) No results for input(s): PROBNP in the last 8760 hours.  CBG:  Recent Labs Lab 12/12/14 1303  GLUCAP 135*    Radiological Exams on Admission: Ct Head Wo Contrast  12/12/2014   CLINICAL DATA:  Difficulty ambulating  EXAM: CT HEAD WITHOUT CONTRAST  TECHNIQUE: Contiguous axial images were obtained from the base of the skull through the vertex without intravenous contrast.  COMPARISON:  None.  FINDINGS: The bony calvarium is intact. The ventricles are of normal size and configuration. No findings to suggest acute hemorrhage, acute infarction or space-occupying mass lesion are noted.  IMPRESSION: No acute intracranial abnormality pre   Electronically Signed   By: Alcide Clever M.D.   On: 12/12/2014 14:56    EKG: Independently reviewed. Sinus with PVC  Assessment/Plan Active Problems:   HTN (hypertension)   Hypokalemia   Hyperglycemia   Gait difficulty   AKI (acute kidney injury)   Gait difficulty/word finding difficulty- CVA/TIA work up -MRI/echo/carotid/PT/OT/FLP/HgbA1C -tele -if +MRI then neuro consult  Hyperglycemia -await HgbA1C  Hypokalemia -replete  HTN -allow permissive HTN for now, hold home meds  AKI -baseline 1 year ago was normal -gentle IVF     Code Status: full DVT Prophylaxis: Family Communication: patient Disposition Plan:   Time spent: 65 min  Marlin Canary [facility] Pager [phone]

## 2014-12-13 ENCOUNTER — Observation Stay (HOSPITAL_BASED_OUTPATIENT_CLINIC_OR_DEPARTMENT_OTHER): Payer: Medicare Other

## 2014-12-13 ENCOUNTER — Observation Stay (HOSPITAL_COMMUNITY): Payer: Medicare Other

## 2014-12-13 DIAGNOSIS — R269 Unspecified abnormalities of gait and mobility: Secondary | ICD-10-CM | POA: Diagnosis not present

## 2014-12-13 DIAGNOSIS — R739 Hyperglycemia, unspecified: Secondary | ICD-10-CM

## 2014-12-13 DIAGNOSIS — N179 Acute kidney failure, unspecified: Secondary | ICD-10-CM | POA: Diagnosis not present

## 2014-12-13 DIAGNOSIS — I10 Essential (primary) hypertension: Secondary | ICD-10-CM

## 2014-12-13 DIAGNOSIS — R4789 Other speech disturbances: Secondary | ICD-10-CM

## 2014-12-13 LAB — GLUCOSE, CAPILLARY
GLUCOSE-CAPILLARY: 123 mg/dL — AB (ref 65–99)
GLUCOSE-CAPILLARY: 161 mg/dL — AB (ref 65–99)
Glucose-Capillary: 202 mg/dL — ABNORMAL HIGH (ref 65–99)

## 2014-12-13 LAB — LIPID PANEL
Cholesterol: 248 mg/dL — ABNORMAL HIGH (ref 0–200)
HDL: 51 mg/dL
LDL Cholesterol: 167 mg/dL — ABNORMAL HIGH (ref 0–99)
Total CHOL/HDL Ratio: 4.9 ratio
Triglycerides: 150 mg/dL — ABNORMAL HIGH
VLDL: 30 mg/dL (ref 0–40)

## 2014-12-13 LAB — BASIC METABOLIC PANEL WITH GFR
Anion gap: 12 (ref 5–15)
BUN: 39 mg/dL — ABNORMAL HIGH (ref 6–20)
CO2: 24 mmol/L (ref 22–32)
Calcium: 8.2 mg/dL — ABNORMAL LOW (ref 8.9–10.3)
Chloride: 99 mmol/L — ABNORMAL LOW (ref 101–111)
Creatinine, Ser: 1.51 mg/dL — ABNORMAL HIGH (ref 0.44–1.00)
GFR calc Af Amer: 37 mL/min — ABNORMAL LOW
GFR calc non Af Amer: 32 mL/min — ABNORMAL LOW
Glucose, Bld: 132 mg/dL — ABNORMAL HIGH (ref 65–99)
Potassium: 3.4 mmol/L — ABNORMAL LOW (ref 3.5–5.1)
Sodium: 135 mmol/L (ref 135–145)

## 2014-12-13 MED ORDER — BUDESONIDE-FORMOTEROL FUMARATE 160-4.5 MCG/ACT IN AERO: 2.0000 | INHALATION_SPRAY | RESPIRATORY_TRACT | Status: DC | PRN

## 2014-12-13 MED ORDER — ASPIRIN 81 MG PO CHEW
81.0000 mg | CHEWABLE_TABLET | Freq: Every day | ORAL | Status: DC
  Administered 2014-12-13: 81 mg via ORAL
  Administered 2014-12-13: 13:00:00 via ORAL
  Administered 2014-12-14: 81 mg via ORAL
  Filled 2014-12-13: qty 1

## 2014-12-13 MED ORDER — SODIUM CHLORIDE 0.9 % IV SOLN
INTRAVENOUS | Status: DC
  Administered 2014-12-13: 21:00:00 via INTRAVENOUS
  Administered 2014-12-13: 1000 mL via INTRAVENOUS
  Administered 2014-12-14: 05:00:00 via INTRAVENOUS

## 2014-12-13 MED ORDER — ASPIRIN 81 MG PO CHEW: 81.0000 mg | CHEWABLE_TABLET | Freq: Every day | ORAL | Status: DC

## 2014-12-13 MED ORDER — POTASSIUM CHLORIDE CRYS ER 20 MEQ PO TBCR
20.0000 meq | EXTENDED_RELEASE_TABLET | Freq: Once | ORAL | Status: AC
  Administered 2014-12-13: 20 meq via ORAL
  Filled 2014-12-13: qty 1

## 2014-12-13 MED ORDER — ASPIRIN 81 MG PO CHEW
CHEWABLE_TABLET | ORAL | Status: AC
  Filled 2014-12-13: qty 1

## 2014-12-13 MED ORDER — OXYCODONE-ACETAMINOPHEN 5-325 MG PO TABS: 1.0000 | ORAL_TABLET | Freq: Four times a day (QID) | ORAL | Status: DC | PRN

## 2014-12-13 NOTE — Progress Notes (Signed)
*  PRELIMINARY RESULTS* Vascular Ultrasound Carotid Duplex (Doppler) has been completed.   Study was technically difficult due to patient anatomy. Findings suggest 1-39% internal carotid artery stenosis bilaterally. Vertebral arteries are patent with antegrade flow.  12/13/2014 9:30 AM Gertie FeyMichelle Kenichi Cassada, RVT, RDCS, RDMS

## 2014-12-13 NOTE — Progress Notes (Signed)
OT Cancellation Note  Patient Details Name: Jean Aguilar MRN: [id number] DOB: 03/04/1936   Cancelled Treatment:    Reason Eval/Treat Not Completed: Patient at procedure or test/ unavailable;Other (comment) (Pt at radiology.) OT to reattempt as schedule permits.  [name], [name] 12/13/2014, 1:29 PM

## 2014-12-13 NOTE — Care Management Note (Signed)
Case Management Note  Patient Details  Name: Jean Aguilar MRN: [id number] Date of Birth: 23-Jun-1935  Subjective/Objective:                    Action/Plan: Met with patient and son to discuss discharge planning. Patient has chosen Haematologist, which she has used in the past. [name] with [facility] was notified and has accepted the referral.  Patient was also provided with a private duty agency list should any additional needs arise.  Patient will be discharging to [address] [place], [place] [address]  [phone].  Expected Discharge Date:   (Pending)               Expected Discharge Plan:  [place]  In-House Referral:     Discharge planning Services  CM Consult  Post Acute Care Choice:    Choice offered to:  Patient  DME Arranged:    DME Agency:  [place] Arranged:  RN, PT, Nurse's Aide (CSW) [place] Agency:     Status of Service:  Completed, signed off  Medicare Important Message Given:    Date Medicare IM Given:    Medicare IM give by:    Date Additional Medicare IM Given:    Additional Medicare Important Message give by:     If discussed at [place] of Stay Meetings, dates discussed:    Additional Comments:  Rolm Baptise, RN 12/13/2014, 4:27 PM

## 2014-12-13 NOTE — Evaluation (Signed)
Physical Therapy Evaluation Patient Details Name: Jean Aguilar MRN: [id number] DOB: 29-Aug-1935 Today's Date: 12/13/2014   History of Present Illness  Patient is a 79 yo female who presents Gait difficulty/word finding difficulty- CVA/TIA work up underway.  Clinical Impression  Patient demonstrates deficits in functional mobility as indicated below. Will need continued skilled PT to address deficits and maximize function. Will see as indicated and progress as tolerated. OF NOTE: Patient with extreme tenderness to palpation of Right buttock and hip, unable to fully assess strength as patient gives way secondary to pain on RLE. MD made aware. Patient denies falls, but is severely unsteady with mobility and continues to have some word finding difficulty.    Follow Up Recommendations Home health PT;Supervision/Assistance - 24 hour    Equipment Recommendations  Other (comment) (recommend Patient uses her RW at discharge)    Recommendations for Other Services       Precautions / Restrictions Precautions Precautions: Fall Restrictions Weight Bearing Restrictions: No      Mobility  Bed Mobility Overal bed mobility: Needs Assistance Bed Mobility: Supine to Sit     Supine to sit: Min assist     General bed mobility comments: Min assist to come to EOB, difficulty secondary to pain in right buttock and hip  Transfers Overall transfer level: Needs assistance Equipment used: Straight cane Transfers: Sit to/from Stand Sit to Stand: Min guard;Min assist         General transfer comment: performed x3, min guard for stability, one noted LOB requiring min assist  Ambulation/Gait Ambulation/Gait assistance: Min assist;Min guard Ambulation Distance (Feet): 60 Feet Assistive device: Straight cane Gait Pattern/deviations: Step-to pattern;Decreased stance time - right;Decreased stride length;Antalgic;Drifts right/left;Narrow base of support Gait velocity: decreased Gait velocity  interpretation: Below normal speed for age/gender General Gait Details: instability noted, several LOB during ambulation despite use of cane, required assist throughout duration. Patient with significant right hip pain during mobility.   Stairs            Wheelchair Mobility    Modified Rankin (Stroke Patients Only) Modified Rankin (Stroke Patients Only) Pre-Morbid Rankin Score: No symptoms Modified Rankin: Moderately severe disability     Balance Overall balance assessment: Needs assistance Sitting-balance support: Feet supported Sitting balance-Leahy Scale: Fair     Standing balance support: Single extremity supported;During functional activity Standing balance-Leahy Scale: Poor Standing balance comment: 3 noted LOB in static standing requiring physical assist to prevent fall                             Pertinent Vitals/Pain Pain Assessment: Faces Faces Pain Scale: Hurts even more (sharp pain 8/10 tender to palpation) Pain Descriptors / Indicators: Sharp;Tender Pain Intervention(s): Limited activity within patient's tolerance;Monitored during session;Repositioned    Home Living Family/patient expects to be discharged to:: Private residence Living Arrangements: Alone Available Help at Discharge: Family (can have 24/7 assist initially) Type of Home: Apartment (condo) Home Access: Level entry     Home Layout: One level Home Equipment: Walker - 4 wheels;Bedside commode      Prior Function Level of Independence: Independent with assistive device(s)         Comments: uses single point cane for mobility     Hand Dominance   Dominant Hand: Right    Extremity/Trunk Assessment   Upper Extremity Assessment: Defer to OT evaluation           Lower Extremity Assessment: Generalized weakness;RLE deficits/detail RLE Deficits /  Details: significant pain and tenderness to palpation. During attempts to assess, patient unable to maintain against  resistance       Communication   Communication: No difficulties  Cognition Arousal/Alertness: Awake/alert Behavior During Therapy: WFL for tasks assessed/performed Overall Cognitive Status: No family/caregiver present to determine baseline cognitive functioning (patient with poor attention, very distractible)                      General Comments      Exercises        Assessment/Plan    PT Assessment Patient needs continued PT services  PT Diagnosis Difficulty walking;Abnormality of gait;Generalized weakness;Acute pain   PT Problem List Decreased strength;Decreased activity tolerance;Decreased balance;Decreased mobility;Decreased coordination;Pain  PT Treatment Interventions DME instruction;Gait training;Functional mobility training;Therapeutic activities;Therapeutic exercise;Balance training;Patient/family education   PT Goals (Current goals can be found in the Care Plan section) Acute Rehab PT Goals Patient Stated Goal: to go home PT Goal Formulation: With patient Time For Goal Achievement: 12/27/14 Potential to Achieve Goals: Good    Frequency Min 3X/week   Barriers to discharge        Co-evaluation               End of Session Equipment Utilized During Treatment: Gait belt Activity Tolerance: Patient limited by pain Patient left: in bed;with bed alarm set (sitting EOB) Nurse Communication: Mobility status    Functional Assessment Tool Used: clinical judgement Functional Limitation: Mobility: Walking and moving around Mobility: Walking and Moving Around Current Status ([id number](G8978): At least 20 percent but less than 40 percent impaired, limited or restricted Mobility: Walking and Moving Around Goal Status [phone](G8979): 0 percent impaired, limited or restricted    Time: [id number] PT Time Calculation (min) (ACUTE ONLY): 22 min   Charges:   PT Evaluation $Initial PT Evaluation Tier I: 1 Procedure     PT G Codes:   PT G-Codes **NOT FOR INPATIENT  CLASS** Functional Assessment Tool Used: clinical judgement Functional Limitation: Mobility: Walking and moving around Mobility: Walking and Moving Around Current Status ([id number](G8978): At least 20 percent but less than 40 percent impaired, limited or restricted Mobility: Walking and Moving Around Goal Status [phone](G8979): 0 percent impaired, limited or restricted    Fabio AsaWerner, Furqan Gosselin J 12/13/2014, 12:19 PM  Charlotte Crumbevon Avaleen Brownley, PT DPT  [phone]

## 2014-12-13 NOTE — Discharge Summary (Addendum)
Physician Discharge Summary  Jean Aguilar ERX:[id number] DOB: Oct 13, 1935 DOA: 12/12/2014  PCP: Philis Fendt, MD  Admit date: 12/12/2014 Discharge date: 12/13/2014  Time spent: 35 minutes  Recommendations for Outpatient Follow-up:  reassess BP, resume medication as needed, and depending on renal function.  Need B-met to follow renal function./  Follow up with orthopedic for chronic hip pain.   Discharge Diagnoses:    HTN (hypertension)   Hypokalemia   Hyperglycemia   Gait difficulty   AKI (acute kidney injury)   Discharge Condition: stable  Diet recommendation: heart healthy  Filed Weights   12/12/14 1800  Weight: 87.59 kg (193 lb 1.6 oz)    History of present illness:  Jean Aguilar is a 79 y.o. female  With PMHx of back pain, HTN, and HLD. She developed yesterday at noon a gait abnormality (walking like a drunk person) and word finding difficulties (knew what she wanted to say but could not get it out), she ignored it but when she was not improved today, came to the ER. Patient walks with a cane at home occasionally.  Patient had an injection in her right hip on July 6th. In the ER, her Cr was found to be elevated above her Baseline. CT scan of brain was normal. I observed patient walking to the bathroom with minimal assitance from tech with a cane  Hospital Course:  1-TIA; symptoms resolved. MRI negative for stroke.  LDL at 161 continue with Crestor. Patient has not been complaint with this medications. Advised to be compliant with med.  Awaiting ECHO results to be able to discharge patient. ;  HBA1c pending.  Carotid doppler 1 to 39 ICA stenosis.  Discharge on aspirin. She was not taking baby aspirin/  Her gait problem is old, but was worse. Need Home health PT.   2-AKI; suspect related to NSAID, diuretic. Improved with IV fluids. Hold ACE and diuretics at discharge. Close follow up with PCP this week for repeat labs and resume BP medications. Patient  aware.   3-Hip pain; x ray negative. Run out of percocet. Will provide prescription. Patient to follow up with primary orthopoedic.   4-HTN; holding lisinopril, HCT due to worsening renal function.   Procedures:  ECHO;   Doppler; 1-39% internal carotid artery stenosis bilaterally. Vertebral arteries are patent with antegrade flow.  Consultations:  none  Discharge Exam: Filed Vitals:   12/13/14 0944  BP: 134/50  Pulse: 66  Temp: 98.8 F (37.1 C)  Resp: 18    General: Alert in no distress.  Cardiovascular: S 1, S 2 RRR Respiratory: CTA  Discharge Instructions    Current Discharge Medication List    START taking these medications   Details  aspirin 81 MG chewable tablet Chew 1 tablet (81 mg total) by mouth daily. Qty: 30 tablet, Refills: 0      CONTINUE these medications which have CHANGED   Details  budesonide-formoterol (SYMBICORT) 160-4.5 MCG/ACT inhaler Inhale 2 puffs into the lungs as needed (for shortness of breath). Qty: 1 Inhaler, Refills: 1    oxyCODONE-acetaminophen (PERCOCET/ROXICET) 5-325 MG per tablet Take 1-2 tablets by mouth every 6 (six) hours as needed for severe pain. Qty: 20 tablet, Refills: 0      CONTINUE these medications which have NOT CHANGED   Details  albuterol (PROVENTIL HFA;VENTOLIN HFA) 108 (90 BASE) MCG/ACT inhaler Inhale 2 puffs into the lungs every 6 (six) hours as needed for wheezing or shortness of breath. Qty: 1 Inhaler, Refills: 1  furosemide (LASIX) 20 MG tablet Take 20 mg by mouth daily as needed. For excess fluid    oxybutynin (DITROPAN-XL) 10 MG 24 hr tablet Take 10 mg by mouth daily. Refills: 0    rosuvastatin (CRESTOR) 10 MG tablet Take 10 mg by mouth daily.    Vitamin D, Ergocalciferol, (DRISDOL) 50000 UNITS CAPS capsule Take 50,000 Units by mouth once a week. Refills: 0      STOP taking these medications     etodolac (LODINE) 400 MG tablet      HYDROcodone-acetaminophen (NORCO) 10-325 MG per tablet       ibuprofen (ADVIL,MOTRIN) 200 MG tablet      lisinopril-hydrochlorothiazide (PRINZIDE,ZESTORETIC) 20-12.5 MG per tablet      potassium chloride SA (K-DUR,KLOR-CON) 20 MEQ tablet        Allergies  Allergen Reactions  . Cephalexin Rash  . Lactose Intolerance (Gi) Other (See Comments)  . Morphine And Related Other (See Comments)    headache  . Penicillins Rash    IV form- causes skin discoloration and swelling at site  . Tuberculin Tests Other (See Comments)    Reaction at site, using chest x-rays instead   Follow-up Information    Follow up with AVBUERE,EDWIN A, MD In 3 days.   Specialty:  Internal Medicine   Contact information:   [place] [address] [phone]        The results of significant diagnostics from this hospitalization (including imaging, microbiology, ancillary and laboratory) are listed below for reference.    Significant Diagnostic Studies: Ct Head Wo Contrast  12/12/2014   CLINICAL DATA:  Difficulty ambulating  EXAM: CT HEAD WITHOUT CONTRAST  TECHNIQUE: Contiguous axial images were obtained from the base of the skull through the vertex without intravenous contrast.  COMPARISON:  None.  FINDINGS: The bony calvarium is intact. The ventricles are of normal size and configuration. No findings to suggest acute hemorrhage, acute infarction or space-occupying mass lesion are noted.  IMPRESSION: No acute intracranial abnormality pre   Electronically Signed   By: Inez Catalina M.D.   On: 12/12/2014 14:56   Mri Brain Without Contrast  12/12/2014   CLINICAL DATA:  79 year old female with acute onset abnormal gait since yesterday, and word finding difficulties. Initial encounter.  EXAM: MRI HEAD WITHOUT CONTRAST  MRA HEAD WITHOUT CONTRAST  TECHNIQUE: Multiplanar, multiecho pulse sequences of the brain and surrounding structures were obtained without intravenous contrast. Angiographic images of the head were obtained using MRA technique without contrast.   COMPARISON:  Head CT without contrast 1420 hr today.  FINDINGS: MRI HEAD FINDINGS  Cerebral volume is within normal limits for age. No restricted diffusion to suggest acute infarction. No midline shift, mass effect, evidence of mass lesion, ventriculomegaly, extra-axial collection or acute intracranial hemorrhage. Cervicomedullary junction and pituitary are within normal limits. Major intracranial vascular flow voids are within normal limits.  There is a small chronic micro hemorrhage in the anterior left frontal lobe on series 5, image 14. Otherwise, there is a small chronic lacunar infarct in the right cerebellar hemisphere on series 7, image 6. Otherwise, gray and white matter signal is within normal limits for age throughout the brain.  Visible internal auditory structures appear normal. Trace mastoid fluid. Negative nasopharynx. Trace paranasal sinus mucosal thickening. Orbits soft tissues appear normal. Negative scalp soft tissues. Normal bone marrow signal. Negative visualized cervical spine.  MRA HEAD FINDINGS  Antegrade flow in the posterior circulation, mildly dominant distal left vertebral artery. Normal right PICA  origin and vertebrobasilar junction. Normal AICA origins, the left AICA appears duplicated. No basilar artery stenosis. Normal SCA and PCA origins. There is mild irregularity of the left P1 and P2 segments with preserved distal flow. Right PCA branches are within normal limits. Posterior communicating arteries are diminutive or absent.  Antegrade flow in both ICA siphons. Cavernous ICA irregularity worse on the left where extensive calcified plaque was demonstrated on CT. Moderate left ICA siphon stenosis. No significant siphon stenosis on the right. Small infundibulum of the left ophthalmic artery origin. Normal carotid termini, MCA and ACA origins. Diminutive or absent anterior communicating artery. Visualized bilateral ACA branches are within normal limits. Visualized bilateral MCA branches  are normal aside from minimal to mild distal M2/ M3 segment irregularity.  IMPRESSION: 1. No acute intracranial abnormality. Mild for age chronic small vessel disease. 2. Left ICA siphon atherosclerosis with moderate stenosis. Evidence of mild atherosclerosis and stenosis involving the left PCA, and distal bilateral MCA branches.   Electronically Signed   By: Genevie Ann M.D.   On: 12/12/2014 21:23   Dg Epidurography  12/07/2014   CLINICAL DATA:  Lumbosacral spondylosis without myelopathy. Lumbar spondylolisthesis. Back pain. RIGHT thigh and numbness. RIGHT L3 radiculopathy. The patient's symptoms are most compatible with a RIGHT L3 radiculopathy.  EXAM: LUMBAR INTERLAMINAR EPIDURAL INJECTION  FLUOROSCOPY TIME:  0 minutes 58 seconds  Dose: 0.444 Gycm2  PROCEDURE: Procedure: After a thorough discussion of risks and benefits of the procedure, written and verbal consent was obtained. Specific risks included puncture of the thecal sac and dura as well as nontherapeutic injection with general risks of bleeding, infection, injury to nerves, blood vessels, and adjacent structures. Time out form was completed. Verbal consent was obtained by Dr. Gerilyn Pilgrim. We discussed the moderate likelihood of moderate lasting relief/attainment of therapeutic goal. The overlying skin was cleansed with betadine soap and anesthetized with 1% lidocaine without epinephrine. An interlaminar approach was performed at L3-L4 on the RIGHT. 3.5 inch 20 gauge needle was advanced using loss-of-resistance technique.  DIAGNOSTIC/THERAPUETIC EPIDURAL INJECTION: Injection of Omnipaque 180 shows a good epidural pattern with spread above and below the level of needle placement, primarily on the side of needle placement. No vascular or subarachnoid opacification was seen. 120 mg of Depo-Medrol mixed with 5 cc of 1% Lidocaine were instilled. The procedure was well-tolerated, and the patient was discharged thirty minutes following the injection in good condition.   IMPRESSION: Technically successful first lumbar interlaminar epidural injection at RIGHT L3-L4.   Electronically Signed   By: Dereck Ligas M.D.   On: 12/07/2014 13:07   Mr Mra Head/brain Wo Cm  12/12/2014   CLINICAL DATA:  79 year old female with acute onset abnormal gait since yesterday, and word finding difficulties. Initial encounter.  EXAM: MRI HEAD WITHOUT CONTRAST  MRA HEAD WITHOUT CONTRAST  TECHNIQUE: Multiplanar, multiecho pulse sequences of the brain and surrounding structures were obtained without intravenous contrast. Angiographic images of the head were obtained using MRA technique without contrast.  COMPARISON:  Head CT without contrast 1420 hr today.  FINDINGS: MRI HEAD FINDINGS  Cerebral volume is within normal limits for age. No restricted diffusion to suggest acute infarction. No midline shift, mass effect, evidence of mass lesion, ventriculomegaly, extra-axial collection or acute intracranial hemorrhage. Cervicomedullary junction and pituitary are within normal limits. Major intracranial vascular flow voids are within normal limits.  There is a small chronic micro hemorrhage in the anterior left frontal lobe on series 5, image 14. Otherwise, there is a small chronic  lacunar infarct in the right cerebellar hemisphere on series 7, image 6. Otherwise, gray and white matter signal is within normal limits for age throughout the brain.  Visible internal auditory structures appear normal. Trace mastoid fluid. Negative nasopharynx. Trace paranasal sinus mucosal thickening. Orbits soft tissues appear normal. Negative scalp soft tissues. Normal bone marrow signal. Negative visualized cervical spine.  MRA HEAD FINDINGS  Antegrade flow in the posterior circulation, mildly dominant distal left vertebral artery. Normal right PICA origin and vertebrobasilar junction. Normal AICA origins, the left AICA appears duplicated. No basilar artery stenosis. Normal SCA and PCA origins. There is mild irregularity of  the left P1 and P2 segments with preserved distal flow. Right PCA branches are within normal limits. Posterior communicating arteries are diminutive or absent.  Antegrade flow in both ICA siphons. Cavernous ICA irregularity worse on the left where extensive calcified plaque was demonstrated on CT. Moderate left ICA siphon stenosis. No significant siphon stenosis on the right. Small infundibulum of the left ophthalmic artery origin. Normal carotid termini, MCA and ACA origins. Diminutive or absent anterior communicating artery. Visualized bilateral ACA branches are within normal limits. Visualized bilateral MCA branches are normal aside from minimal to mild distal M2/ M3 segment irregularity.  IMPRESSION: 1. No acute intracranial abnormality. Mild for age chronic small vessel disease. 2. Left ICA siphon atherosclerosis with moderate stenosis. Evidence of mild atherosclerosis and stenosis involving the left PCA, and distal bilateral MCA branches.   Electronically Signed   By: Genevie Ann M.D.   On: 12/12/2014 21:23    Microbiology: No results found for this or any previous visit (from the past 240 hour(s)).   Labs: Basic Metabolic Panel:  Recent Labs Lab 12/12/14 1305 12/12/14 1309 12/13/14 0550  NA 139 138 135  K 3.3* 3.1* 3.4*  CL 103 101 99*  CO2 26  --  24  GLUCOSE 131* 130* 132*  BUN 44* 43* 39*  CREATININE 1.78* 1.70* 1.51*  CALCIUM 8.7*  --  8.2*   Liver Function Tests:  Recent Labs Lab 12/12/14 1305  AST 12*  ALT 9*  ALKPHOS 66  BILITOT 0.4  PROT 6.5  ALBUMIN 3.6   No results for input(s): LIPASE, AMYLASE in the last 168 hours. No results for input(s): AMMONIA in the last 168 hours. CBC:  Recent Labs Lab 12/12/14 1305 12/12/14 1309  WBC 8.9  --   NEUTROABS 4.9  --   HGB 12.9 13.6  HCT 37.7 40.0  MCV 91.3  --   PLT 244  --    Cardiac Enzymes: No results for input(s): CKTOTAL, CKMB, CKMBINDEX, TROPONINI in the last 168 hours. BNP: BNP (last 3 results) No results  for input(s): BNP in the last 8760 hours.  ProBNP (last 3 results) No results for input(s): PROBNP in the last 8760 hours.  CBG:  Recent Labs Lab 12/12/14 1303 12/12/14 1824  GLUCAP 135* 135*       Signed:  Arkel Cartwright A  [facility] 12/13/2014, 11:40 AM

## 2014-12-14 ENCOUNTER — Encounter: Payer: Self-pay | Admitting: *Deleted

## 2014-12-14 ENCOUNTER — Observation Stay (HOSPITAL_BASED_OUTPATIENT_CLINIC_OR_DEPARTMENT_OTHER): Payer: Medicare Other

## 2014-12-14 DIAGNOSIS — G459 Transient cerebral ischemic attack, unspecified: Secondary | ICD-10-CM

## 2014-12-14 DIAGNOSIS — N179 Acute kidney failure, unspecified: Secondary | ICD-10-CM

## 2014-12-14 DIAGNOSIS — R269 Unspecified abnormalities of gait and mobility: Secondary | ICD-10-CM

## 2014-12-14 LAB — BASIC METABOLIC PANEL
Anion gap: 8 (ref 5–15)
BUN: 25 mg/dL — ABNORMAL HIGH (ref 6–20)
CHLORIDE: 107 mmol/L (ref 101–111)
CO2: 23 mmol/L (ref 22–32)
Calcium: 8.5 mg/dL — ABNORMAL LOW (ref 8.9–10.3)
Creatinine, Ser: 1.29 mg/dL — ABNORMAL HIGH (ref 0.44–1.00)
GFR calc Af Amer: 45 mL/min — ABNORMAL LOW (ref >60)
GFR calc non Af Amer: 39 mL/min — ABNORMAL LOW (ref >60)
Glucose, Bld: 155 mg/dL — ABNORMAL HIGH (ref 65–99)
POTASSIUM: 4.3 mmol/L (ref 3.5–5.1)
SODIUM: 138 mmol/L (ref 135–145)

## 2014-12-14 LAB — RAPID URINE DRUG SCREEN, HOSP PERFORMED
Amphetamines: NOT DETECTED
BARBITURATES: NOT DETECTED
BENZODIAZEPINES: NOT DETECTED
COCAINE: NOT DETECTED
Opiates: NOT DETECTED
Tetrahydrocannabinol: NOT DETECTED

## 2014-12-14 LAB — HEMOGLOBIN A1C
HEMOGLOBIN A1C: 6.5 % — AB (ref 4.8–5.6)
MEAN PLASMA GLUCOSE: 140 mg/dL

## 2014-12-14 LAB — GLUCOSE, CAPILLARY
GLUCOSE-CAPILLARY: 134 mg/dL — AB (ref 65–99)
Glucose-Capillary: 145 mg/dL — ABNORMAL HIGH (ref 65–99)

## 2014-12-14 MED ORDER — SIMETHICONE 80 MG PO CHEW
80.0000 mg | CHEWABLE_TABLET | Freq: Four times a day (QID) | ORAL | Status: DC
  Administered 2014-12-14: 80 mg via ORAL
  Filled 2014-12-14 (×2): qty 1

## 2014-12-14 MED ORDER — LISINOPRIL 20 MG PO TABS
20.0000 mg | ORAL_TABLET | Freq: Every day | ORAL | Status: DC
Start: 2014-12-14 — End: 2015-11-27

## 2014-12-14 MED ORDER — POTASSIUM CHLORIDE CRYS ER 20 MEQ PO TBCR
40.0000 meq | EXTENDED_RELEASE_TABLET | Freq: Once | ORAL | Status: AC
  Administered 2014-12-14: 40 meq via ORAL
  Filled 2014-12-14: qty 2

## 2014-12-14 MED ORDER — OXYBUTYNIN CHLORIDE ER 10 MG PO TB24
10.0000 mg | ORAL_TABLET | Freq: Every day | ORAL | Status: DC
  Administered 2014-12-14: 10 mg via ORAL
  Filled 2014-12-14 (×2): qty 1

## 2014-12-14 NOTE — Progress Notes (Signed)
  Echocardiogram 2D Echocardiogram has been performed.  Clearence PedCrouch, Dairon Procter 12/14/2014, 4:58 PM

## 2014-12-14 NOTE — Progress Notes (Signed)
Patient discharged home. RN discussed discharge instructions with patient. Patient vocalized understanding of instructions including follow up appointment, medications. Discharge instructions and prescriptions given to patient. Patient escorted to son's car by wheelchair with nursing tech.

## 2014-12-14 NOTE — Progress Notes (Signed)
Patient stated to RN that over the last hour, she has had 2 episodes of feeling she is about to faint. Patient states it was associated with heavy headedness. Patient states she was lying in bed when this occurred, was not ambulating prior to. VSS, see assessment. MD notified.

## 2014-12-14 NOTE — Progress Notes (Signed)
Occupational Therapy Evaluation Patient Details Name: Jean Aguilar MRN: [id number] DOB: 11/03/1935 Today's Date: 12/14/2014    History of Present Illness Patient is a 79 yo female who presents Gait difficulty/word finding difficulty- CVA/TIA work up underway.   Clinical Impression   Pt admitted with the above diagnoses and presents with below problem list. Pt will benefit from continued OT to address the below listed deficits and maximize independence with BADLs prior to d/c to venue to below. PTA pt was mod I with ADLs. Pt is currently min guard for LB ADLs, transfers, and functional mobility. Pt reporting that a short while ago she, "felt like someone was pulling me and letting me go." Pt c/o heaviness throughout her head. Pt reporting an episode of heaviness in her head combined with nausea and dizziness. Dizziness and nausea appear to have resolved but heaviness in head remained until pt reported towards end of session that it was easing off. Vitals assessed after supine>EOB bp: 176/73, bpm 64, O2 100. Pt requesting to go to the bathroom. After returning vitals reassesed EOB: bp: 162/38, bpm: 63. After 3-4 minute rest break vitals reassessed EOB: bp: 138/57, bpm: 63. Nursing notified. OT to continue to follow acutely.      Follow Up Recommendations  Supervision/Assistance - 24 hour;Home health OT    Equipment Recommendations  Tub/shower bench    Recommendations for Other Services Speech consult;Other (comment) (intermittent word finding difficulty)     Precautions / Restrictions Precautions Precautions: Fall Restrictions Weight Bearing Restrictions: No      Mobility Bed Mobility Overal bed mobility: Needs Assistance Bed Mobility: Supine to Sit;Sit to Supine     Supine to sit: Min guard;HOB elevated Sit to supine: Modified independent (Device/Increase time)   General bed mobility comments: Min guard for supine>sit with heavy use of rails and HOB elevated. Increased  effort/time for sit>supine but no physical assist.   Transfers Overall transfer level: Needs assistance Equipment used: Rolling walker (2 wheeled) Transfers: Sit to/from Stand Sit to Stand: Min guard         General transfer comment: from EOB and 3n1; cues for technique with rw especially for keeping rw in front of her during pivoting.     Balance Overall balance assessment: Needs assistance Sitting-balance support: No upper extremity supported;Feet supported Sitting balance-Leahy Scale: Good     Standing balance support: Bilateral upper extremity supported;During functional activity Standing balance-Leahy Scale: Poor Standing balance comment: rw for balance                            ADL Overall ADL's : Needs assistance/impaired Eating/Feeding: Set up;Sitting   Grooming: Set up;Sitting   Upper Body Bathing: Set up;Sitting   Lower Body Bathing: Min guard;Sit to/from stand;With adaptive equipment   Upper Body Dressing : Set up;Sitting   Lower Body Dressing: Min guard;Sit to/from stand;With adaptive equipment   Toilet Transfer: Min guard;Ambulation;Comfort height toilet;RW   Toileting- Architect and Hygiene: Min guard;Sit to/from stand   Tub/ Shower Transfer: Min guard;Ambulation;Tub bench;Rolling walker   Functional mobility during ADLs: Min guard;Rolling walker General ADL Comments: Pt ambulated to/from bathroom and completed toilet transfer including cm/h at min guard level. Pt c/o "heavy head" and did have elevated bp (see general comments) during session. Discussed arranging assist at d/c and to wait to shower until someone is with her.      Vision     Perception     Praxis  Pertinent Vitals/Pain Pain Assessment: Faces Faces Pain Scale: Hurts little more Pain Location: Pt c/o "my head feels heavy." Pain Descriptors / Indicators: Heaviness Pain Intervention(s): Monitored during session;Limited activity within patient's  tolerance;Repositioned     Hand Dominance Right   Extremity/Trunk Assessment Upper Extremity Assessment Upper Extremity Assessment: Overall WFL for tasks assessed;Generalized weakness   Lower Extremity Assessment Lower Extremity Assessment: Defer to PT evaluation       Communication Communication Communication: Other (comment) (some word finding difficulties at times)   Cognition Arousal/Alertness: Awake/alert Behavior During Therapy: WFL for tasks assessed/performed Overall Cognitive Status: No family/caregiver present to determine baseline cognitive functioning       Memory: Decreased short-term memory             General Comments       Exercises       Shoulder Instructions      Home Living Family/patient expects to be discharged to:: Private residence Living Arrangements: Alone Available Help at Discharge: Family;Available PRN/intermittently;Other (Comment) (Pt reports she will try to arrange assist at d/c.) Type of Home: Apartment Home Access: Level entry     Home Layout: One level     Bathroom Shower/Tub: Tub/shower unit     Bathroom Accessibility: Yes How Accessible: Accessible via walker Home Equipment: Walker - 4 wheels;Bedside commode          Prior Functioning/Environment Level of Independence: Independent with assistive device(s)        Comments: uses single point cane for mobility    OT Diagnosis: Other (comment);Generalized weakness (impaired balance)   OT Problem List: Decreased activity tolerance;Impaired balance (sitting and/or standing);Decreased cognition;Decreased knowledge of use of DME or AE;Decreased knowledge of precautions;Cardiopulmonary status limiting activity   OT Treatment/Interventions: Self-care/ADL training;Therapeutic exercise;DME and/or AE instruction;Therapeutic activities;Cognitive remediation/compensation;Patient/family education;Balance training    OT Goals(Current goals can be found in the care plan  section) Acute Rehab OT Goals Patient Stated Goal: to go home OT Goal Formulation: With patient Time For Goal Achievement: 12/28/14 Potential to Achieve Goals: Good ADL Goals Pt Will Perform Lower Body Bathing: with modified independence;with adaptive equipment;sit to/from stand Pt Will Perform Lower Body Dressing: with modified independence;with adaptive equipment;sit to/from stand Pt Will Transfer to Toilet: with modified independence;ambulating Pt Will Perform Toileting - Clothing Manipulation and hygiene: with modified independence;sit to/from stand Pt Will Perform Tub/Shower Transfer: Tub transfer;with modified independence;tub bench;shower seat;rolling walker Additional ADL Goal #1: Pt will complete bed mobility at mod I level to prepare for OOB ADLs.  OT Frequency: Min 2X/week   Barriers to D/C: Decreased caregiver support          Co-evaluation              End of Session Equipment Utilized During Treatment: Gait belt;Rolling walker Nurse Communication: Other (comment) (elevated bp; pt c/o "heavy head")  Activity Tolerance: Patient tolerated treatment well;Other (comment) ("heavy head", elevated bp) Patient left: in bed;with call bell/phone within reach;with bed alarm set   Time: 1019-1050 OT Time Calculation (min): 31 min Charges:  OT General Charges $OT Visit: 1 Procedure OT Evaluation $Initial OT Evaluation Tier I: 1 Procedure OT Treatments $Self Care/Home Management : 8-22 mins G-Codes:    [name], Alianys Chacko H 12/14/2014, 11:18 AM

## 2014-12-14 NOTE — Discharge Instructions (Signed)
Please do not take ibuprofen, etodolac which can affect your kidneys function.  Aspirin, ASA chewable tablets What is this medicine? ASPIRIN (AS pir in) is a pain reliever. It is used to treat mild pain and fever. This medicine is also used as directed by a doctor to prevent and to treat heart attacks, to prevent strokes, and to treat arthritis or inflammation. This medicine may be used for other purposes; ask your health care provider or pharmacist if you have questions. COMMON BRAND NAME(S): Bayer Aspirin, Bayer Children's Aspirin, [facility] Aspirin What should I tell my health care provider before I take this medicine? They need to know if you have any of these conditions: -anemia -asthma -bleeding problems -child with chickenpox, the flu, or other viral infection -diabetes -gout -if you frequently drink alcohol containing drinks -kidney disease -liver disease -low level of vitamin K -lupus -smoke tobacco -stomach ulcers or other problems -an unusual or allergic reaction to aspirin, tartrazine dye, other medicines, dyes, or preservatives -pregnant or trying to get pregnant -breast-feeding How should I use this medicine? Take this medicine by mouth. Chew it completely before swallowing. Follow the directions on the package or prescription label. Do not take your medicine more often than directed. Talk to your pediatrician regarding the use of this medicine in children. While this drug may be prescribed for children as young as 20 years of age for selected conditions, precautions do apply. Children and teenagers should not use this medicine to treat chicken pox or flu symptoms unless directed by a doctor. Patients over 4 years old may have a stronger reaction and need a smaller dose. Overdosage: If you think you have taken too much of this medicine contact a poison control center or emergency room at once. NOTE: This medicine is only for you. Do not share this medicine with  others. What if I miss a dose? If you are taking this medicine on a regular schedule and miss a dose, take it as soon as you can. If it is almost time for your next dose, take only that dose. Do not take double or extra doses. What may interact with this medicine? Do not take this medicine with any of the following medications: -cidofovir -ketorolac -probenecid This medicine may also interact with the following medications: -alcohol -alendronate -bismuth subsalicylate -flavocoxid -herbal supplements like feverfew, garlic, ginger, ginkgo biloba, horse chestnut -medicines for diabetes or glaucoma like acetazolamide, methazolamide -medicines for gout -medicines that treat or prevent blood clots like enoxaparin, heparin, ticlopidine, warfarin -other aspirin and aspirin-like medicines -NSAIDs, medicines for pain and inflammation, like ibuprofen or naproxen -pemetrexed -sulfinpyrazone -varicella live vaccine This list may not describe all possible interactions. Give your health care provider a list of all the medicines, herbs, non-prescription drugs, or dietary supplements you use. Also tell them if you smoke, drink alcohol, or use illegal drugs. Some items may interact with your medicine. What should I watch for while using this medicine? If you are treating yourself for pain, tell your doctor or health care professional if the pain lasts more than 10 days, if it gets worse, or if there is a new or different kind of pain. Tell your doctor if you see redness or swelling. Also, check with your doctor if you have a fever that lasts for more than 3 days. Only take this medicine to prevent heart attacks or blood clotting if prescribed by your doctor or health care professional. Do not take aspirin or aspirin-like medicines with this medicine. Too  much aspirin can be dangerous. Always read the labels carefully. This medicine can irritate your stomach or cause bleeding problems. Do not smoke cigarettes  or drink alcohol while taking this medicine. Do not lie down for 30 minutes after taking this medicine to prevent irritation to your throat. If you are scheduled for any medical or dental procedure, tell your healthcare provider that you are taking this medicine. You may need to stop taking this medicine before the procedure. What side effects may I notice from receiving this medicine? Side effects that you should report to your doctor or health care professional as soon as possible: -allergic reactions like skin rash, itching or hives, swelling of the face, lips, or tongue -breathing problems -changes in hearing, ringing in the ears -confusion -general ill feeling or flu-like symptoms -pain on swallowing -redness, blistering, peeling or loosening of the skin, including inside the mouth or nose -signs and symptoms of bleeding such as bloody or black, tarry stools; red or dark-brown urine; spitting up blood or brown material that looks like coffee grounds; red spots on the skin; unusual bruising or bleeding from the eye, gums, or nose -trouble passing urine or change in the amount of urine -unusually weak or tired -yellowing of the eyes or skin Side effects that usually do not require medical attention (report to your doctor or health care professional if they continue or are bothersome): -diarrhea or constipation -nausea, vomiting -stomach gas, heartburn This list may not describe all possible side effects. Call your doctor for medical advice about side effects. You may report side effects to FDA at 1-800-FDA-1088. Where should I keep my medicine? Keep out of the reach of children. Store at room temperature between 15 and 30 degrees C (59 and 86 degrees F). Protect from heat and moisture. Do not use this medicine if it has a strong vinegar smell. Throw away any unused medicine after the expiration date. NOTE: This sheet is a summary. It may not cover all possible information. If you have  questions about this medicine, talk to your doctor, pharmacist, or health care provider.  2015, Elsevier/Gold Standard. (2012-09-09 15:34:27)  Budesonide; Formoterol Inhalation What is this medicine? BUDESONIDE; FORMOTEROL (byoo DES oh nide; for MOH te rol) inhalation is a combination of two medicines that decrease inflammation and help to open up the airways in your lungs. It is used to treat asthma. Do NOT use for an acute asthma attack. This medicine may be used for other purposes; ask your health care provider or pharmacist if you have questions. COMMON BRAND NAME(S): Symbicort What should I tell my health care provider before I take this medicine? They need to know if you have any of these conditions: -bone problems -diabetes -heart disease or irregular heartbeat -high blood pressure -immune system problems -infection -liver disease -worsening asthma -an unusual or allergic reaction to budesonide, formoterol, medicines, foods, dyes, or preservatives -pregnant or trying to get pregnant -breast-feeding How should I use this medicine? This medicine is inhaled through the mouth. Rinse your mouth with water after use. Make sure not to swallow the water. Follow the directions on your prescription label. Do not use more often than directed. Do not stop taking except on your doctor's advice. Make sure that you are using your inhaler correctly. Ask your doctor or health care provider if you have any questions. A special MedGuide will be given to you by the pharmacist with each prescription and refill. Be sure to read this information carefully each time. Talk  to your pediatrician regarding the use of this medicine in children. While this drug may be prescribed for children as young as 75 years of age for selected conditions, precautions do apply. Overdosage: If you think you have taken too much of this medicine contact a poison control center or emergency room at once. NOTE: This medicine is  only for you. Do not share this medicine with others. What if I miss a dose? If you miss a dose, use it as soon as you remember. If it is almost time for your next dose, use only that dose and continue with your regular schedule, spacing doses evenly. Do not use double or extra doses. What may interact with this medicine? Do not take this medicine with any of the following medications: -MAOIs like Carbex, Eldepryl, Marplan, Nardil, and Parnate -mifepristone -probucol -procarbazine -some other medicines for asthma like formoterol, salmeterol This medicine may also interact with the following medications: -antibiotics like clarithromycin, erythromycin -cimetidine -diuretics -grapefruit juice -itraconazole -ketoconazole -medicines for depression, anxiety, or psychotic disturbances -medicines for irregular heartbeat -methadone -some heart medicines like atenolol, metoprolol -some other medicines for breathing problems -some vaccines This list may not describe all possible interactions. Give your health care provider a list of all the medicines, herbs, non-prescription drugs, or dietary supplements you use. Also tell them if you smoke, drink alcohol, or use illegal drugs. Some items may interact with your medicine. What should I watch for while using this medicine? Tell your doctor or health care professional if your symptoms do not improve or get worse. If you need to use your short-acting inhalers more often, call your doctor right away. Do not use more than every 12 hours. If you have asthma, be aware that using this medicine may increase your risk of dying from asthma related problems. Talk to your doctor about the risks and benefits of taking this medicine. NEVER use this medicine for an acute asthma attack. This medicine may increase your risk of getting an infection. Tell your doctor or health care professional if you are around anyone with measles or chickenpox, or if you develop sores  or blisters that do not heal properly. What side effects may I notice from receiving this medicine? Side effects that you should report to your doctor or health care professional as soon as possible: -allergic reactions such as skin rash or itching, hives, swelling of the face, lips or tongue -breathing problems -changes in vision -chest pain -fast, irregular heartbeat -feeling faint or lightheaded, falls -fever -high blood pressure -nervousness -tremors -white patches or sores in mouth Side effects that usually do not require medical attention (report to your doctor or health care professional if they continue or are bothersome): -cough -different taste in mouth -headache -sore throat -stuffy nose -stomach upset This list may not describe all possible side effects. Call your doctor for medical advice about side effects. You may report side effects to FDA at 1-800-FDA-1088. Where should I keep my medicine? Keep out of the reach of children. Store in a dry place at room temperature between 20 and 25 degrees C (68 and 77 degrees F). Do not get the inhaler wet. Keep track of the number of doses used. Throw away the inhaler after using the marked number of inhalations or after the expiration date, whichever comes first. Do not burn or puncture canister. NOTE: This sheet is a summary. It may not cover all possible information. If you have questions about this medicine, talk to your doctor, pharmacist,  or health care provider.  2015, Else[employer]/Gold Standard. (2012-09-24 16:01:23)  Lisinopril tablets What is this medicine? LISINOPRIL (lyse IN oh pril) is an ACE inhibitor. This medicine is used to treat high blood pressure and heart failure. It is also used to protect the heart immediately after a heart attack. This medicine may be used for other purposes; ask your health care provider or pharmacist if you have questions. COMMON BRAND NAME(S): Prinivil, Zestril What should I tell my health  care provider before I take this medicine? They need to know if you have any of these conditions: -diabetes -heart or blood vessel disease -immune system disease like lupus or scleroderma -kidney disease -low blood pressure -previous swelling of the tongue, face, or lips with difficulty breathing, difficulty swallowing, hoarseness, or tightening of the throat -an unusual or allergic reaction to lisinopril, other ACE inhibitors, insect venom, foods, dyes, or preservatives -pregnant or trying to get pregnant -breast-feeding How should I use this medicine? Take this medicine by mouth with a glass of water. Follow the directions on your prescription label. You may take this medicine with or without food. Take your medicine at regular intervals. Do not stop taking this medicine except on the advice of your doctor or health care professional. Talk to your pediatrician regarding the use of this medicine in children. Special care may be needed. While this drug may be prescribed for children as young as 24 years of age for selected conditions, precautions do apply. Overdosage: If you think you have taken too much of this medicine contact a poison control center or emergency room at once. NOTE: This medicine is only for you. Do not share this medicine with others. What if I miss a dose? If you miss a dose, take it as soon as you can. If it is almost time for your next dose, take only that dose. Do not take double or extra doses. What may interact with this medicine? -diuretics -lithium -NSAIDs, medicines for pain and inflammation, like ibuprofen or naproxen -over-the-counter herbal supplements like hawthorn -potassium salts or potassium supplements -salt substitutes This list may not describe all possible interactions. Give your health care provider a list of all the medicines, herbs, non-prescription drugs, or dietary supplements you use. Also tell them if you smoke, drink alcohol, or use illegal  drugs. Some items may interact with your medicine. What should I watch for while using this medicine? Visit your doctor or health care professional for regular check ups. Check your blood pressure as directed. Ask your doctor what your blood pressure should be, and when you should contact him or her. Call your doctor or health care professional if you notice an irregular or fast heart beat. Women should inform their doctor if they wish to become pregnant or think they might be pregnant. There is a potential for serious side effects to an unborn child. Talk to your health care professional or pharmacist for more information. Check with your doctor or health care professional if you get an attack of severe diarrhea, nausea and vomiting, or if you sweat a lot. The loss of too much body fluid can make it dangerous for you to take this medicine. You may get drowsy or dizzy. Do not drive, use machinery, or do anything that needs mental alertness until you know how this drug affects you. Do not stand or sit up quickly, especially if you are an older patient. This reduces the risk of dizzy or fainting spells. Alcohol can make you more drowsy  and dizzy. Avoid alcoholic drinks. Avoid salt substitutes unless you are told otherwise by your doctor or health care professional. Do not treat yourself for coughs, colds, or pain while you are taking this medicine without asking your doctor or health care professional for advice. Some ingredients may increase your blood pressure. What side effects may I notice from receiving this medicine? Side effects that you should report to your doctor or health care professional as soon as possible: -abdominal pain with or without nausea or vomiting -allergic reactions like skin rash or hives, swelling of the hands, feet, face, lips, throat, or tongue -dark urine -difficulty breathing -dizzy, lightheaded or fainting spell -fever or sore throat -irregular heart beat, chest  pain -pain or difficulty passing urine -redness, blistering, peeling or loosening of the skin, including inside the mouth -unusually weak -yellowing of the eyes or skin Side effects that usually do not require medical attention (report to your doctor or health care professional if they continue or are bothersome): -change in taste -cough -decreased sexual function or desire -headache -sun sensitivity -tiredness This list may not describe all possible side effects. Call your doctor for medical advice about side effects. You may report side effects to FDA at 1-800-FDA-1088. Where should I keep my medicine? Keep out of the reach of children. Store at room temperature between 15 and 30 degrees C (59 and 86 degrees F). Protect from moisture. Keep container tightly closed. Throw away any unused medicine after the expiration date. NOTE: This sheet is a summary. It may not cover all possible information. If you have questions about this medicine, talk to your doctor, pharmacist, or health care provider.  2015, [employer]/Gold Standard. (2007-11-23 17:36:32)  Acetaminophen; Oxycodone tablets What is this medicine? ACETAMINOPHEN; OXYCODONE (a set a MEE noe fen; ox i KOE done) is a pain reliever. It is used to treat mild to moderate pain. This medicine may be used for other purposes; ask your health care provider or pharmacist if you have questions. COMMON BRAND NAME(S): Endocet, Magnacet, Narvox, Percocet, Perloxx, Primalev, Primlev, Roxicet, Xolox What should I tell my health care provider before I take this medicine? They need to know if you have any of these conditions: -brain tumor -Crohn's disease, inflammatory bowel disease, or ulcerative colitis -drug abuse or addiction -head injury -heart or circulation problems -if you often drink alcohol -kidney disease or problems going to the bathroom -liver disease -lung disease, asthma, or breathing problems -an unusual or allergic reaction to  acetaminophen, oxycodone, other opioid analgesics, other medicines, foods, dyes, or preservatives -pregnant or trying to get pregnant -breast-feeding How should I use this medicine? Take this medicine by mouth with a full glass of water. Follow the directions on the prescription label. Take your medicine at regular intervals. Do not take your medicine more often than directed. Talk to your pediatrician regarding the use of this medicine in children. Special care may be needed. Patients over 246 years old may have a stronger reaction and need a smaller dose. Overdosage: If you think you have taken too much of this medicine contact a poison control center or emergency room at once. NOTE: This medicine is only for you. Do not share this medicine with others. What if I miss a dose? If you miss a dose, take it as soon as you can. If it is almost time for your next dose, take only that dose. Do not take double or extra doses. What may interact with this medicine? -alcohol -antihistamines -barbiturates like amobarbital,  butalbital, butabarbital, methohexital, pentobarbital, phenobarbital, thiopental, and secobarbital -benztropine -drugs for bladder problems like solifenacin, trospium, oxybutynin, tolterodine, hyoscyamine, and methscopolamine -drugs for breathing problems like ipratropium and tiotropium -drugs for certain stomach or intestine problems like propantheline, homatropine methylbromide, glycopyrrolate, atropine, belladonna, and dicyclomine -general anesthetics like etomidate, ketamine, nitrous oxide, propofol, desflurane, enflurane, halothane, isoflurane, and sevoflurane -medicines for depression, anxiety, or psychotic disturbances -medicines for sleep -muscle relaxants -naltrexone -narcotic medicines (opiates) for pain -phenothiazines like perphenazine, thioridazine, chlorpromazine, mesoridazine, fluphenazine, prochlorperazine, promazine, and  trifluoperazine -scopolamine -tramadol -trihexyphenidyl This list may not describe all possible interactions. Give your health care provider a list of all the medicines, herbs, non-prescription drugs, or dietary supplements you use. Also tell them if you smoke, drink alcohol, or use illegal drugs. Some items may interact with your medicine. What should I watch for while using this medicine? Tell your doctor or health care professional if your pain does not go away, if it gets worse, or if you have new or a different type of pain. You may develop tolerance to the medicine. Tolerance means that you will need a higher dose of the medication for pain relief. Tolerance is normal and is expected if you take this medicine for a long time. Do not suddenly stop taking your medicine because you may develop a severe reaction. Your body becomes used to the medicine. This does NOT mean you are addicted. Addiction is a behavior related to getting and using a drug for a non-medical reason. If you have pain, you have a medical reason to take pain medicine. Your doctor will tell you how much medicine to take. If your doctor wants you to stop the medicine, the dose will be slowly lowered over time to avoid any side effects. You may get drowsy or dizzy. Do not drive, use machinery, or do anything that needs mental alertness until you know how this medicine affects you. Do not stand or sit up quickly, especially if you are an older patient. This reduces the risk of dizzy or fainting spells. Alcohol may interfere with the effect of this medicine. Avoid alcoholic drinks. There are different types of narcotic medicines (opiates) for pain. If you take more than one type at the same time, you may have more side effects. Give your health care provider a list of all medicines you use. Your doctor will tell you how much medicine to take. Do not take more medicine than directed. Call emergency for help if you have problems  breathing. The medicine will cause constipation. Try to have a bowel movement at least every 2 to 3 days. If you do not have a bowel movement for 3 days, call your doctor or health care professional. Do not take Tylenol (acetaminophen) or medicines that have acetaminophen with this medicine. Too much acetaminophen can be very dangerous. Many nonprescription medicines contain acetaminophen. Always read the labels carefully to avoid taking more acetaminophen. What side effects may I notice from receiving this medicine? Side effects that you should report to your doctor or health care professional as soon as possible: -allergic reactions like skin rash, itching or hives, swelling of the face, lips, or tongue -breathing difficulties, wheezing -confusion -light headedness or fainting spells -severe stomach pain -unusually weak or tired -yellowing of the skin or the whites of the eyes Side effects that usually do not require medical attention (report to your doctor or health care professional if they continue or are bothersome): -dizziness -drowsiness -nausea -vomiting This list may not describe all  possible side effects. Call your doctor for medical advice about side effects. You may report side effects to FDA at 1-800-FDA-1088. Where should I keep my medicine? Keep out of the reach of children. This medicine can be abused. Keep your medicine in a safe place to protect it from theft. Do not share this medicine with anyone. Selling or giving away this medicine is dangerous and against the law. Store at room temperature between 20 and 25 degrees C (68 and 77 degrees F). Keep container tightly closed. Protect from light. This medicine may cause accidental overdose and death if it is taken by other adults, children, or pets. Flush any unused medicine down the toilet to reduce the chance of harm. Do not use the medicine after the expiration date. NOTE: This sheet is a summary. It may not cover all  possible information. If you have questions about this medicine, talk to your doctor, pharmacist, or health care provider.  2015, [employer] Standard. (2013-01-11 13:17:35)  Transient Ischemic Attack A transient ischemic attack (TIA) is a "warning stroke" that causes stroke-like symptoms. Unlike a stroke, a TIA does not cause permanent damage to the brain. The symptoms of a TIA can happen very fast and do not last long. It is important to know the symptoms of a TIA and what to do. This can help prevent a major stroke or death. CAUSES   A TIA is caused by a temporary blockage in an artery in the brain or neck (carotid artery). The blockage does not allow the brain to get the blood supply it needs and can cause different symptoms. The blockage can be caused by either:  A blood clot.  Fatty buildup (plaque) in a neck or brain artery. RISK FACTORS  High blood pressure (hypertension).  High cholesterol.  Diabetes mellitus.  Heart disease.  The build up of plaque in the blood vessels (peripheral artery disease or atherosclerosis).  The build up of plaque in the blood vessels providing blood and oxygen to the brain (carotid artery stenosis).  An abnormal heart rhythm (atrial fibrillation).  Obesity.  Smoking.  Taking oral contraceptives (especially in combination with smoking).  Physical inactivity.  A diet high in fats, salt (sodium), and calories.  Alcohol use.  Use of illegal drugs (especially cocaine and methamphetamine).  Being female.  Being African American.  Being over the age of 58.  Family history of stroke.  Previous history of blood clots, stroke, TIA, or heart attack.  Sickle cell disease. SYMPTOMS  TIA symptoms are the same as a stroke but are temporary. These symptoms usually develop suddenly, or may be newly present upon awakening from sleep:  Sudden weakness or numbness of the face, arm, or leg, especially on one side of the body.  Sudden trouble  walking or difficulty moving arms or legs.  Sudden confusion.  Sudden personality changes.  Trouble speaking (aphasia) or understanding.  Difficulty swallowing.  Sudden trouble seeing in one or both eyes.  Double vision.  Dizziness.  Loss of balance or coordination.  Sudden severe headache with no known cause.  Trouble reading or writing.  Loss of bowel or bladder control.  Loss of consciousness. DIAGNOSIS  Your caregiver may be able to determine the presence or absence of a TIA based on your symptoms, history, and physical exam. Computed tomography (CT scan) of the brain is usually performed to help identify a TIA. Other tests may be done to diagnose a TIA. These tests may include:  Electrocardiography.  Continuous heart monitoring.  Echocardiography.  Carotid ultrasonography.  Magnetic resonance imaging (MRI).  A scan of the brain circulation.  Blood tests. PREVENTION  The risk of a TIA can be decreased by appropriately treating high blood pressure, high cholesterol, diabetes, heart disease, and obesity and by quitting smoking, limiting alcohol, and staying physically active. TREATMENT  Time is of the essence. Since the symptoms of TIA are the same as a stroke, it is important to seek treatment as soon as possible because you may need a medicine to dissolve the clot (thrombolytic) that cannot be given if too much time has passed. Treatment options vary. Treatment options may include rest, oxygen, intravenous (IV) fluids, and medicines to thin the blood (anticoagulants). Medicines and diet may be used to address diabetes, high blood pressure, and other risk factors. Measures will be taken to prevent short-term and long-term complications, including infection from breathing foreign material into the lungs (aspiration pneumonia), blood clots in the legs, and falls. Treatment options include procedures to either remove plaque in the carotid arteries or dilate carotid  arteries that have narrowed due to plaque. Those procedures are:  Carotid endarterectomy.  Carotid angioplasty and stenting. HOME CARE INSTRUCTIONS   Take all medicines prescribed by your caregiver. Follow the directions carefully. Medicines may be used to control risk factors for a stroke. Be sure you understand all your medicine instructions.  You may be told to take aspirin or the anticoagulant warfarin. Warfarin needs to be taken exactly as instructed.  Taking too much or too little warfarin is dangerous. Too much warfarin increases the risk of bleeding. Too little warfarin continues to allow the risk for blood clots. While taking warfarin, you will need to have regular blood tests to measure your blood clotting time. A PT blood test measures how long it takes for blood to clot. Your PT is used to calculate another value called an INR. Your PT and INR help your caregiver to adjust your dose of warfarin. The dose can change for many reasons. It is critically important that you take warfarin exactly as prescribed.  Many foods, especially foods high in vitamin K can interfere with warfarin and affect the PT and INR. Foods high in vitamin K include spinach, kale, broccoli, cabbage, collard and turnip greens, brussels sprouts, peas, cauliflower, seaweed, and parsley as well as beef and pork liver, green tea, and soybean oil. You should eat a consistent amount of foods high in vitamin K. Avoid major changes in your diet, or notify your caregiver before changing your diet. Arrange a visit with a dietitian to answer your questions.  Many medicines can interfere with warfarin and affect the PT and INR. You must tell your caregiver about any and all medicines you take, this includes all vitamins and supplements. Be especially cautious with aspirin and anti-inflammatory medicines. Do not take or discontinue any prescribed or over-the-counter medicine except on the advice of your caregiver or  pharmacist.  Warfarin can have side effects, such as excessive bruising or bleeding. You will need to hold pressure over cuts for longer than usual. Your caregiver or pharmacist will discuss other potential side effects.  Avoid sports or activities that may cause injury or bleeding.  Be mindful when shaving, flossing your teeth, or handling sharp objects.  Alcohol can change the body's ability to handle warfarin. It is best to avoid alcoholic drinks or consume only very small amounts while taking warfarin. Notify your caregiver if you change your alcohol intake.  Notify your dentist or other caregivers  before procedures.  Eat a diet that includes 5 or more servings of fruits and vegetables each day. This may reduce the risk of stroke. Certain diets may be prescribed to address high blood pressure, high cholesterol, diabetes, or obesity.  A low-sodium, low-saturated fat, low-trans fat, low-cholesterol diet is recommended to manage high blood pressure.  A low-saturated fat, low-trans fat, low-cholesterol, and high-fiber diet may control cholesterol levels.  A controlled-carbohydrate, controlled-sugar diet is recommended to manage diabetes.  A reduced-calorie, low-sodium, low-saturated fat, low-trans fat, low-cholesterol diet is recommended to manage obesity.  Maintain a healthy weight.  Stay physically active. It is recommended that you get at least 30 minutes of activity on most or all days.  Do not smoke.  Limit alcohol use even if you are not taking warfarin. Moderate alcohol use is considered to be:  No more than 2 drinks each day for men.  No more than 1 drink each day for nonpregnant women.  Stop drug abuse.  Home safety. A safe home environment is important to reduce the risk of falls. Your caregiver may arrange for specialists to evaluate your home. Having grab bars in the bedroom and bathroom is often important. Your caregiver may arrange for equipment to be used at home,  such as raised toilets and a seat for the shower.  Follow all instructions for follow-up with your caregiver. This is very important. This includes any referrals and lab tests. Proper follow up can prevent a stroke or another TIA from occurring. SEEK MEDICAL CARE IF:  You have personality changes.  You have difficulty swallowing.  You are seeing double.  You have dizziness.  You have a fever.  You have skin breakdown. SEEK IMMEDIATE MEDICAL CARE IF:  Any of these symptoms may represent a serious problem that is an emergency. Do not wait to see if the symptoms will go away. Get medical help right away. Call your local emergency services (911 in [place] Do not drive yourself to the hospital.  You have sudden weakness or numbness of the face, arm, or leg, especially on one side of the body.  You have sudden trouble walking or difficulty moving arms or legs.  You have sudden confusion.  You have trouble speaking (aphasia) or understanding.  You have sudden trouble seeing in one or both eyes.  You have a loss of balance or coordination.  You have a sudden, severe headache with no known cause.  You have new chest pain or an irregular heartbeat.  You have a partial or total loss of consciousness. MAKE SURE YOU:   Understand these instructions.  Will watch your condition.  Will get help right away if you are not doing well or get worse. Document Released: 02/27/2005 Document Revised: 05/25/2013 Document Reviewed: 08/25/2013 [facility] Patient Information 2015 [place], [place]. This information is not intended to replace advice given to you by your health care provider. Make sure you discuss any questions you have with your health care provider.

## 2014-12-14 NOTE — Discharge Summary (Signed)
Physician Discharge Summary  Jean Aguilar ZOX:[id number] DOB: 05/13/1936 DOA: 12/12/2014  PCP: Dorrene German, MD  Admit date: 12/12/2014 Discharge date: 12/14/2014  1. Time spent: greater than 30 minutes  See discharge summary by Dr. Huel Cote Weights   12/12/14 1800  Weight: 87.59 kg (193 lb 1.6 oz)    Discharge Exam: Filed Vitals:   12/14/14 1043  BP: 138/57  Pulse:   Temp:   Resp:     General: a and o Cardiovascular: RRR Respiratory: CTA  Discharge Instructions    Current Discharge Medication List    START taking these medications   Details  aspirin 81 MG chewable tablet Chew 1 tablet (81 mg total) by mouth daily. Qty: 30 tablet, Refills: 0    lisinopril (PRINIVIL,ZESTRIL) 20 MG tablet Take 1 tablet (20 mg total) by mouth daily. Qty: 30 tablet, Refills: 0      CONTINUE these medications which have CHANGED   Details  budesonide-formoterol (SYMBICORT) 160-4.5 MCG/ACT inhaler Inhale 2 puffs into the lungs as needed (for shortness of breath). Qty: 1 Inhaler, Refills: 1    oxyCODONE-acetaminophen (PERCOCET/ROXICET) 5-325 MG per tablet Take 1-2 tablets by mouth every 6 (six) hours as needed for severe pain. Qty: 20 tablet, Refills: 0      CONTINUE these medications which have NOT CHANGED   Details  albuterol (PROVENTIL HFA;VENTOLIN HFA) 108 (90 BASE) MCG/ACT inhaler Inhale 2 puffs into the lungs every 6 (six) hours as needed for wheezing or shortness of breath. Qty: 1 Inhaler, Refills: 1    furosemide (LASIX) 20 MG tablet Take 20 mg by mouth daily as needed. For excess fluid    oxybutynin (DITROPAN-XL) 10 MG 24 hr tablet Take 10 mg by mouth daily. Refills: 0    rosuvastatin (CRESTOR) 10 MG tablet Take 10 mg by mouth daily.    Vitamin D, Ergocalciferol, (DRISDOL) 50000 UNITS CAPS capsule Take 50,000 Units by mouth once a week. Refills: 0      STOP taking these medications     etodolac (LODINE) 400 MG tablet      HYDROcodone-acetaminophen  (NORCO) 10-325 MG per tablet      ibuprofen (ADVIL,MOTRIN) 200 MG tablet      lisinopril-hydrochlorothiazide (PRINZIDE,ZESTORETIC) 20-12.5 MG per tablet      potassium chloride SA (K-DUR,KLOR-CON) 20 MEQ tablet        Allergies  Allergen Reactions  . Cephalexin Rash  . Lactose Intolerance (Gi) Other (See Comments)  . Morphine And Related Other (See Comments)    headache  . Penicillins Rash    IV form- causes skin discoloration and swelling at site  . Tuberculin Tests Other (See Comments)    Reaction at site, using chest x-rays instead   Follow-up Information    Follow up with AVBUERE,EDWIN A, MD In 3 days.   Specialty:  Internal Medicine   Contact information:   [address] [place] [address] [phone]        The results of significant diagnostics from this hospitalization (including imaging, microbiology, ancillary and laboratory) are listed below for reference.    Significant Diagnostic Studies: Ct Head Wo Contrast  12/12/2014   CLINICAL DATA:  Difficulty ambulating  EXAM: CT HEAD WITHOUT CONTRAST  TECHNIQUE: Contiguous axial images were obtained from the base of the skull through the vertex without intravenous contrast.  COMPARISON:  None.  FINDINGS: The bony calvarium is intact. The ventricles are of normal size and configuration. No findings to suggest acute hemorrhage, acute infarction or space-occupying  mass lesion are noted.  IMPRESSION: No acute intracranial abnormality pre   Electronically Signed   By: Alcide CleverMark  Lukens M.D.   On: 12/12/2014 14:56   Mri Brain Without Contrast  12/12/2014   CLINICAL DATA:  79 year old female with acute onset abnormal gait since yesterday, and word finding difficulties. Initial encounter.  EXAM: MRI HEAD WITHOUT CONTRAST  MRA HEAD WITHOUT CONTRAST  TECHNIQUE: Multiplanar, multiecho pulse sequences of the brain and surrounding structures were obtained without intravenous contrast. Angiographic images of the head were obtained using  MRA technique without contrast.  COMPARISON:  Head CT without contrast 1420 hr today.  FINDINGS: MRI HEAD FINDINGS  Cerebral volume is within normal limits for age. No restricted diffusion to suggest acute infarction. No midline shift, mass effect, evidence of mass lesion, ventriculomegaly, extra-axial collection or acute intracranial hemorrhage. Cervicomedullary junction and pituitary are within normal limits. Major intracranial vascular flow voids are within normal limits.  There is a small chronic micro hemorrhage in the anterior left frontal lobe on series 5, image 14. Otherwise, there is a small chronic lacunar infarct in the right cerebellar hemisphere on series 7, image 6. Otherwise, gray and white matter signal is within normal limits for age throughout the brain.  Visible internal auditory structures appear normal. Trace mastoid fluid. Negative nasopharynx. Trace paranasal sinus mucosal thickening. Orbits soft tissues appear normal. Negative scalp soft tissues. Normal bone marrow signal. Negative visualized cervical spine.  MRA HEAD FINDINGS  Antegrade flow in the posterior circulation, mildly dominant distal left vertebral artery. Normal right PICA origin and vertebrobasilar junction. Normal AICA origins, the left AICA appears duplicated. No basilar artery stenosis. Normal SCA and PCA origins. There is mild irregularity of the left P1 and P2 segments with preserved distal flow. Right PCA branches are within normal limits. Posterior communicating arteries are diminutive or absent.  Antegrade flow in both ICA siphons. Cavernous ICA irregularity worse on the left where extensive calcified plaque was demonstrated on CT. Moderate left ICA siphon stenosis. No significant siphon stenosis on the right. Small infundibulum of the left ophthalmic artery origin. Normal carotid termini, MCA and ACA origins. Diminutive or absent anterior communicating artery. Visualized bilateral ACA branches are within normal limits.  Visualized bilateral MCA branches are normal aside from minimal to mild distal M2/ M3 segment irregularity.  IMPRESSION: 1. No acute intracranial abnormality. Mild for age chronic small vessel disease. 2. Left ICA siphon atherosclerosis with moderate stenosis. Evidence of mild atherosclerosis and stenosis involving the left PCA, and distal bilateral MCA branches.   Electronically Signed   By: Odessa FlemingH  Hall M.D.   On: 12/12/2014 21:23   Dg Epidurography  12/07/2014   CLINICAL DATA:  Lumbosacral spondylosis without myelopathy. Lumbar spondylolisthesis. Back pain. RIGHT thigh and numbness. RIGHT L3 radiculopathy. The patient's symptoms are most compatible with a RIGHT L3 radiculopathy.  EXAM: LUMBAR INTERLAMINAR EPIDURAL INJECTION  FLUOROSCOPY TIME:  0 minutes 58 seconds  Dose: 0.444 Gycm2  PROCEDURE: Procedure: After a thorough discussion of risks and benefits of the procedure, written and verbal consent was obtained. Specific risks included puncture of the thecal sac and dura as well as nontherapeutic injection with general risks of bleeding, infection, injury to nerves, blood vessels, and adjacent structures. Time out form was completed. Verbal consent was obtained by Dr. Carlota RaspberryLamke. We discussed the moderate likelihood of moderate lasting relief/attainment of therapeutic goal. The overlying skin was cleansed with betadine soap and anesthetized with 1% lidocaine without epinephrine. An interlaminar approach was performed at L3-L4 on  the RIGHT. 3.5 inch 20 gauge needle was advanced using loss-of-resistance technique.  DIAGNOSTIC/THERAPUETIC EPIDURAL INJECTION: Injection of Omnipaque 180 shows a good epidural pattern with spread above and below the level of needle placement, primarily on the side of needle placement. No vascular or subarachnoid opacification was seen. 120 mg of Depo-Medrol mixed with 5 cc of 1% Lidocaine were instilled. The procedure was well-tolerated, and the patient was discharged thirty minutes following  the injection in good condition.  IMPRESSION: Technically successful first lumbar interlaminar epidural injection at RIGHT L3-L4.   Electronically Signed   By: Andreas Newport M.D.   On: 12/07/2014 13:07   Mr Maxine Glenn Head/brain Wo Cm  12/12/2014   CLINICAL DATA:  79 year old female with acute onset abnormal gait since yesterday, and word finding difficulties. Initial encounter.  EXAM: MRI HEAD WITHOUT CONTRAST  MRA HEAD WITHOUT CONTRAST  TECHNIQUE: Multiplanar, multiecho pulse sequences of the brain and surrounding structures were obtained without intravenous contrast. Angiographic images of the head were obtained using MRA technique without contrast.  COMPARISON:  Head CT without contrast 1420 hr today.  FINDINGS: MRI HEAD FINDINGS  Cerebral volume is within normal limits for age. No restricted diffusion to suggest acute infarction. No midline shift, mass effect, evidence of mass lesion, ventriculomegaly, extra-axial collection or acute intracranial hemorrhage. Cervicomedullary junction and pituitary are within normal limits. Major intracranial vascular flow voids are within normal limits.  There is a small chronic micro hemorrhage in the anterior left frontal lobe on series 5, image 14. Otherwise, there is a small chronic lacunar infarct in the right cerebellar hemisphere on series 7, image 6. Otherwise, gray and white matter signal is within normal limits for age throughout the brain.  Visible internal auditory structures appear normal. Trace mastoid fluid. Negative nasopharynx. Trace paranasal sinus mucosal thickening. Orbits soft tissues appear normal. Negative scalp soft tissues. Normal bone marrow signal. Negative visualized cervical spine.  MRA HEAD FINDINGS  Antegrade flow in the posterior circulation, mildly dominant distal left vertebral artery. Normal right PICA origin and vertebrobasilar junction. Normal AICA origins, the left AICA appears duplicated. No basilar artery stenosis. Normal SCA and PCA  origins. There is mild irregularity of the left P1 and P2 segments with preserved distal flow. Right PCA branches are within normal limits. Posterior communicating arteries are diminutive or absent.  Antegrade flow in both ICA siphons. Cavernous ICA irregularity worse on the left where extensive calcified plaque was demonstrated on CT. Moderate left ICA siphon stenosis. No significant siphon stenosis on the right. Small infundibulum of the left ophthalmic artery origin. Normal carotid termini, MCA and ACA origins. Diminutive or absent anterior communicating artery. Visualized bilateral ACA branches are within normal limits. Visualized bilateral MCA branches are normal aside from minimal to mild distal M2/ M3 segment irregularity.  IMPRESSION: 1. No acute intracranial abnormality. Mild for age chronic small vessel disease. 2. Left ICA siphon atherosclerosis with moderate stenosis. Evidence of mild atherosclerosis and stenosis involving the left PCA, and distal bilateral MCA branches.   Electronically Signed   By: Odessa Fleming M.D.   On: 12/12/2014 21:23   Dg Hip Unilat With Pelvis 2-3 Views Right  12/13/2014   CLINICAL DATA:  Pain over right hip  EXAM: DG HIP (WITH OR WITHOUT PELVIS) 2-3V RIGHT  COMPARISON:  None.  FINDINGS: The right hip appears located. There is no acute fracture or subluxation. No radio-opaque foreign body or soft tissue calcification. Mild degenerative changes are noted involving the right hip.  IMPRESSION: 1. Mild  right hip osteoarthritis. 2. No acute fractures.   Electronically Signed   By: Signa Kell M.D.   On: 12/13/2014 13:14   Echo Left ventricle: The cavity size was normal. There was mild focal basal hypertrophy of the septum. Systolic function was normal. The estimated ejection fraction was in the range of 55% to 60%. Wall motion was normal; there were no regional wall motion abnormalities. Doppler parameters are consistent with abnormal left ventricular relaxation  (grade 1 diastolic dysfunction). - Mitral valve: There was mild regurgitation. - Left atrium: The atrium was mildly dilated. - Right ventricle: The cavity size was mildly dilated. Wall thickness was normal.  Impressions:  - No cardiac source of emboli was indentified.  Microbiology: No results found for this or any previous visit (from the past 240 hour(s)).   Labs: Basic Metabolic Panel:  Recent Labs Lab 12/12/14 1305 12/12/14 1309 12/13/14 0550  NA 139 138 135  K 3.3* 3.1* 3.4*  CL 103 101 99*  CO2 26  --  24  GLUCOSE 131* 130* 132*  BUN 44* 43* 39*  CREATININE 1.78* 1.70* 1.51*  CALCIUM 8.7*  --  8.2*   Liver Function Tests:  Recent Labs Lab 12/12/14 1305  AST 12*  ALT 9*  ALKPHOS 66  BILITOT 0.4  PROT 6.5  ALBUMIN 3.6   No results for input(s): LIPASE, AMYLASE in the last 168 hours. No results for input(s): AMMONIA in the last 168 hours. CBC:  Recent Labs Lab 12/12/14 1305 12/12/14 1309  WBC 8.9  --   NEUTROABS 4.9  --   HGB 12.9 13.6  HCT 37.7 40.0  MCV 91.3  --   PLT 244  --    Cardiac Enzymes: No results for input(s): CKTOTAL, CKMB, CKMBINDEX, TROPONINI in the last 168 hours. BNP: BNP (last 3 results) No results for input(s): BNP in the last 8760 hours.  ProBNP (last 3 results) No results for input(s): PROBNP in the last 8760 hours.  CBG:  Recent Labs Lab 12/13/14 1129 12/13/14 1649 12/13/14 2131 12/14/14 0631 12/14/14 1204  GLUCAP 123* 202* 161* 134* 145*       Signed:  Bryttany Tortorelli L  [facility] 12/14/2014, 1:52 PM

## 2014-12-15 NOTE — Progress Notes (Signed)
   12/14/14 1120  OT Visit Information  Last OT Received On 12/14/14  OT G-codes **NOT FOR INPATIENT CLASS**  Functional Assessment Tool Used clinical judgement  Functional Limitation Self care  Self Care Current Status [phone](G8987) CI  Self Care Goal Status ([phone]([id number]) CI  Late entry for G-code status.  Raynald KempKathryn Hokulani Rogel OTR/L Pager: [phone]

## 2014-12-16 ENCOUNTER — Emergency Department (HOSPITAL_COMMUNITY)
Admission: EM | Admit: 2014-12-16 | Discharge: 2014-12-17 | Disposition: A | Discharge status: Home / Self Care | Payer: Medicare Other | Attending: Emergency Medicine | Admitting: Emergency Medicine

## 2014-12-16 ENCOUNTER — Emergency Department (HOSPITAL_COMMUNITY): Payer: Medicare Other

## 2014-12-16 ENCOUNTER — Encounter (HOSPITAL_COMMUNITY): Payer: Self-pay | Admitting: Emergency Medicine

## 2014-12-16 DIAGNOSIS — Z90710 Acquired absence of both cervix and uterus: Secondary | ICD-10-CM | POA: Insufficient documentation

## 2014-12-16 DIAGNOSIS — Z79899 Other long term (current) drug therapy: Secondary | ICD-10-CM | POA: Diagnosis not present

## 2014-12-16 DIAGNOSIS — I10 Essential (primary) hypertension: Secondary | ICD-10-CM | POA: Insufficient documentation

## 2014-12-16 DIAGNOSIS — R1084 Generalized abdominal pain: Secondary | ICD-10-CM | POA: Diagnosis not present

## 2014-12-16 DIAGNOSIS — E119 Type 2 diabetes mellitus without complications: Secondary | ICD-10-CM | POA: Insufficient documentation

## 2014-12-16 DIAGNOSIS — Z880 Allergy status to penicillin: Secondary | ICD-10-CM | POA: Insufficient documentation

## 2014-12-16 DIAGNOSIS — Z9049 Acquired absence of other specified parts of digestive tract: Secondary | ICD-10-CM | POA: Diagnosis not present

## 2014-12-16 DIAGNOSIS — Z87442 Personal history of urinary calculi: Secondary | ICD-10-CM | POA: Diagnosis not present

## 2014-12-16 DIAGNOSIS — Z9889 Other specified postprocedural states: Secondary | ICD-10-CM | POA: Diagnosis not present

## 2014-12-16 DIAGNOSIS — Z8719 Personal history of other diseases of the digestive system: Secondary | ICD-10-CM | POA: Diagnosis not present

## 2014-12-16 DIAGNOSIS — J45909 Unspecified asthma, uncomplicated: Secondary | ICD-10-CM | POA: Insufficient documentation

## 2014-12-16 DIAGNOSIS — Z720 Tobacco use: Secondary | ICD-10-CM | POA: Diagnosis not present

## 2014-12-16 DIAGNOSIS — R109 Unspecified abdominal pain: Secondary | ICD-10-CM

## 2014-12-16 DIAGNOSIS — Z7982 Long term (current) use of aspirin: Secondary | ICD-10-CM | POA: Insufficient documentation

## 2014-12-16 DIAGNOSIS — M1990 Unspecified osteoarthritis, unspecified site: Secondary | ICD-10-CM | POA: Insufficient documentation

## 2014-12-16 DIAGNOSIS — E780 Pure hypercholesterolemia: Secondary | ICD-10-CM | POA: Diagnosis not present

## 2014-12-16 DIAGNOSIS — Z7951 Long term (current) use of inhaled steroids: Secondary | ICD-10-CM | POA: Diagnosis not present

## 2014-12-16 LAB — COMPREHENSIVE METABOLIC PANEL
ALT: 11 U/L — ABNORMAL LOW (ref 14–54)
AST: 15 U/L (ref 15–41)
Albumin: 3.7 g/dL (ref 3.5–5.0)
Alkaline Phosphatase: 44 U/L (ref 38–126)
Anion gap: 9 (ref 5–15)
BILIRUBIN TOTAL: 0.6 mg/dL (ref 0.3–1.2)
BUN: 23 mg/dL — ABNORMAL HIGH (ref 6–20)
CO2: 25 mmol/L (ref 22–32)
Calcium: 9.3 mg/dL (ref 8.9–10.3)
Chloride: 109 mmol/L (ref 101–111)
Creatinine, Ser: 1.35 mg/dL — ABNORMAL HIGH (ref 0.44–1.00)
GFR calc Af Amer: 43 mL/min — ABNORMAL LOW (ref >60)
GFR, EST NON AFRICAN AMERICAN: 37 mL/min — AB (ref >60)
GLUCOSE: 123 mg/dL — AB (ref 65–99)
Potassium: 3.9 mmol/L (ref 3.5–5.1)
Sodium: 143 mmol/L (ref 135–145)
Total Protein: 6.3 g/dL — ABNORMAL LOW (ref 6.5–8.1)

## 2014-12-16 LAB — CBC
HCT: 36.6 % (ref 36.0–46.0)
Hemoglobin: 12.1 g/dL (ref 12.0–15.0)
MCH: 30.7 pg (ref 26.0–34.0)
MCHC: 33.1 g/dL (ref 30.0–36.0)
MCV: 92.9 fL (ref 78.0–100.0)
Platelets: 231 10*3/uL (ref 150–400)
RBC: 3.94 MIL/uL (ref 3.87–5.11)
RDW: 13.4 % (ref 11.5–15.5)
WBC: 8.1 10*3/uL (ref 4.0–10.5)

## 2014-12-16 LAB — URINALYSIS, ROUTINE W REFLEX MICROSCOPIC
Bilirubin Urine: NEGATIVE
GLUCOSE, UA: NEGATIVE mg/dL
HGB URINE DIPSTICK: NEGATIVE
KETONES UR: 15 mg/dL — AB
Nitrite: NEGATIVE
Protein, ur: NEGATIVE mg/dL
SPECIFIC GRAVITY, URINE: 1.025 (ref 1.005–1.030)
Urobilinogen, UA: 0.2 mg/dL (ref 0.0–1.0)
pH: 5 (ref 5.0–8.0)

## 2014-12-16 LAB — URINE MICROSCOPIC-ADD ON

## 2014-12-16 MED ORDER — SODIUM CHLORIDE 0.9 % IV BOLUS (SEPSIS)
1000.0000 mL | Freq: Once | INTRAVENOUS | Status: AC
  Administered 2014-12-16: 1000 mL via INTRAVENOUS

## 2014-12-16 MED ORDER — ONDANSETRON HCL 4 MG/2ML IJ SOLN
4.0000 mg | Freq: Once | INTRAMUSCULAR | Status: AC
  Administered 2014-12-16: 4 mg via INTRAVENOUS
  Filled 2014-12-16: qty 2

## 2014-12-16 MED ORDER — HYDROMORPHONE HCL 1 MG/ML IJ SOLN
0.5000 mg | Freq: Once | INTRAMUSCULAR | Status: AC
  Administered 2014-12-16: 0.5 mg via INTRAVENOUS
  Filled 2014-12-16: qty 1

## 2014-12-16 MED ORDER — HYDROMORPHONE HCL 1 MG/ML IJ SOLN
0.5000 mg | Freq: Once | INTRAMUSCULAR | Status: AC
  Administered 2014-12-17: 0.5 mg via INTRAVENOUS
  Filled 2014-12-16: qty 1

## 2014-12-16 MED ORDER — KETOROLAC TROMETHAMINE 30 MG/ML IJ SOLN
30.0000 mg | Freq: Once | INTRAMUSCULAR | Status: AC
  Administered 2014-12-17: 30 mg via INTRAVENOUS
  Filled 2014-12-16: qty 1

## 2014-12-16 NOTE — ED Notes (Signed)
Pt returned from xray

## 2014-12-16 NOTE — ED Notes (Signed)
Pt. reports left flank pain onset today , denies injury , no hematuria or dysuria . Pain increases with movement or changing positions . Denies fever or chills.

## 2014-12-16 NOTE — ED Notes (Signed)
To ct

## 2014-12-16 NOTE — Patient Outreach (Signed)
[facility] Care Management  12/16/2014  Otho KetJohnnie M Ricciardi 09/05/1935 [id number]   Patient agreed to receiving [facility] stroke calls.  [facility]-Care Management will follow-up with patient if needed per daily dashboard report from [facility].

## 2014-12-16 NOTE — ED Provider Notes (Signed)
CSN: [id number]     Arrival date & time 12/16/14  2128 History   First MD Initiated Contact with Patient 12/16/14 2152     Chief Complaint  Patient presents with  . Flank Pain     (Consider location/radiation/quality/duration/timing/severity/associated sxs/prior Treatment) Patient is a 79 y.o. female presenting with flank pain.  Flank Pain This is a recurrent problem. The current episode started 2 days ago. The problem occurs constantly. The problem has not changed since onset.Associated symptoms include abdominal pain. Pertinent negatives include no chest pain, no headaches and no shortness of breath. Nothing aggravates the symptoms. Nothing relieves the symptoms. She has tried nothing for the symptoms.    Past Medical History  Diagnosis Date  . Hypertension   . Complication of anesthesia     WAKE UP DURING KNEE ARTHROSCOPY  . Kidney stones   . Headache(784.0)   . High cholesterol   . Asthma   . Chronic bronchitis     "get it qd" (02/20/2012)  . Sleep apnea     10 + 12 YRS NO MACHINE  . Type II diabetes mellitus   . Hypothyroidism     "used to take Synthroid; don't take anything now" (02/20/2012)  . GERD (gastroesophageal reflux disease)   . Lactose intolerance   . Arthritis     "full body" (02/20/2012)  . Fibromyalgia   . Hemorrhoids   . Fibromyalgia   . Cervical spondylosis with myelopathy   . Carotid stenosis   . Irritable colon   . Asthma   . HLD (hyperlipidemia)   . H/O echocardiogram   . Abnormal myocardial perfusion study     lung/heart ratio was measured at 0.29   Past Surgical History  Procedure Laterality Date  . Elbow surgery      bilateral; "left for fluid; right to get rid of nerve/muscles fusing themselves together" 02/20/2012  . Shoulder arthroscopy      RIGHT  . Knee arthroscopy      BILATERAL   . Total knee arthroplasty  02/20/2012    left  . Tonsillectomy  1950's  . Appendectomy  1950's  . Abdominal hysterectomy  ~ 1979  . Cardiac  catheterization    . Mastectomy complete / simple      "right; came back clean; simple"  . Breast biopsy      bilaterally  . Cystoscopy w/ ureteral stent placement  1970's?  . Cystoscopy w/ ureteral stent removal  1970's?  . Total knee arthroplasty  02/20/2012    Procedure: TOTAL KNEE ARTHROPLASTY;  Surgeon: Kennieth Rad, MD;  Location: [facility] OR;  Service: Orthopedics;  Laterality: Left;   Family History  Problem Relation Age of Onset  . Alzheimer's disease Mother 69   History  Substance Use Topics  . Smoking status: Current Some Day Smoker -- 0 years    Types: Cigarettes  . Smokeless tobacco: Never Used     Comment: 02/20/2012 "once or twice/wk couple cigarettes each time"  . Alcohol Use: No   OB History    No data available     Review of Systems  Respiratory: Negative for shortness of breath.   Cardiovascular: Negative for chest pain.  Gastrointestinal: Positive for abdominal pain.  Genitourinary: Positive for flank pain.  Neurological: Negative for headaches.  All other systems reviewed and are negative.     Allergies  Cephalexin; Lactose intolerance (gi); Morphine and related; Penicillins; and Tuberculin tests  Home Medications   Prior to Admission medications   Medication Sig  Start Date End Date Taking? Authorizing Provider  albuterol (PROVENTIL HFA;VENTOLIN HFA) 108 (90 BASE) MCG/ACT inhaler Inhale 2 puffs into the lungs every 6 (six) hours as needed for wheezing or shortness of breath. 01/18/13   Nonie Hoyer, PA-C  aspirin 81 MG chewable tablet Chew 1 tablet (81 mg total) by mouth daily. 12/13/14   Belkys A Regalado, MD  budesonide-formoterol (SYMBICORT) 160-4.5 MCG/ACT inhaler Inhale 2 puffs into the lungs as needed (for shortness of breath). 12/13/14   Belkys A Regalado, MD  furosemide (LASIX) 20 MG tablet Take 20 mg by mouth daily as needed. For excess fluid    Historical Provider, MD  lisinopril (PRINIVIL,ZESTRIL) 20 MG tablet Take 1 tablet (20 mg total) by  mouth daily. 12/14/14   Christiane Ha, MD  oxybutynin (DITROPAN-XL) 10 MG 24 hr tablet Take 10 mg by mouth daily. 12/11/14   Historical Provider, MD  oxyCODONE-acetaminophen (PERCOCET/ROXICET) 5-325 MG per tablet Take 1-2 tablets by mouth every 4 (four) hours as needed for severe pain. 12/17/14   Mirian Mo, MD  rosuvastatin (CRESTOR) 10 MG tablet Take 10 mg by mouth daily.    Historical Provider, MD  Vitamin D, Ergocalciferol, (DRISDOL) 50000 UNITS CAPS capsule Take 50,000 Units by mouth once a week. 10/12/14   Historical Provider, MD   BP 139/76 mmHg  Pulse 82  Temp(Src) 99 F (37.2 C) (Oral)  Resp 18  SpO2 99% Physical Exam  Constitutional: She is oriented to person, place, and time. She appears well-developed and well-nourished.  HENT:  Head: Normocephalic and atraumatic.  Right Ear: External ear normal.  Left Ear: External ear normal.  Eyes: Conjunctivae and EOM are normal. Pupils are equal, round, and reactive to light.  Neck: Normal range of motion. Neck supple.  Cardiovascular: Normal rate, regular rhythm, normal heart sounds and intact distal pulses.   Pulmonary/Chest: Effort normal and breath sounds normal.  Abdominal: Soft. Bowel sounds are normal. There is generalized tenderness. There is CVA tenderness (L).  Musculoskeletal: Normal range of motion.  Neurological: She is alert and oriented to person, place, and time.  Skin: Skin is warm and dry.  Vitals reviewed.   ED Course  Procedures (including critical care time) Labs Review Labs Reviewed  COMPREHENSIVE METABOLIC PANEL - Abnormal; Notable for the following:    Glucose, Bld 123 (*)    BUN 23 (*)    Creatinine, Ser 1.35 (*)    Total Protein 6.3 (*)    ALT 11 (*)    GFR calc non Af Amer 37 (*)    GFR calc Af Amer 43 (*)    All other components within normal limits  URINALYSIS, ROUTINE W REFLEX MICROSCOPIC (NOT AT [facility]) - Abnormal; Notable for the following:    APPearance HAZY (*)    Ketones, ur 15 (*)     Leukocytes, UA TRACE (*)    All other components within normal limits  URINE MICROSCOPIC-ADD ON - Abnormal; Notable for the following:    Casts HYALINE CASTS (*)    All other components within normal limits  CBC    Imaging Review Dg Chest 2 View  12/16/2014   CLINICAL DATA:  78 year old female with left flank pain  EXAM: CHEST  2 VIEW  COMPARISON:  Chest radiograph dated 03/09/2013  FINDINGS: The heart size and mediastinal contours are within normal limits. Both lungs are clear. The visualized skeletal structures are unremarkable. Right chest wall surgical clips noted.  IMPRESSION: No active cardiopulmonary disease.   Electronically Signed  By: Elgie CollardArash  Radparvar M.D.   On: 12/16/2014 23:58   Ct Renal Stone Study  12/16/2014   CLINICAL DATA:  Left flank pain.  History of kidney stones.  EXAM: CT ABDOMEN AND PELVIS WITHOUT CONTRAST  TECHNIQUE: Multidetector CT imaging of the abdomen and pelvis was performed following the standard protocol without IV contrast.  COMPARISON:  06/29/2013  FINDINGS: Lung bases are clear.  Calcified lymph nodes in the right hilum.  Kidneys are symmetrical in size and shape. No hydronephrosis or hydroureter. Cyst in the upper pole of the left kidney measuring 2.2 cm. No renal, ureteral, or bladder stones. Bladder is decompressed.  The unenhanced appearance of the liver, spleen, gallbladder, pancreas, adrenal glands, inferior vena cava, and retroperitoneal lymph nodes is unremarkable. Calcification of the abdominal aorta without aneurysm. Stomach and small bowel are decompressed. Scattered stool in the colon without abnormal distention. Small periumbilical hernia containing fat. No free air or free fluid in the abdomen.  Pelvis: Appendix is surgically absent. Uterus is surgically absent. No abnormal pelvic mass or lymphadenopathy. No free or loculated pelvic fluid collections. Sigmoid colon is unremarkable. Degenerative changes in the lumbar spine. No destructive bone lesions.   IMPRESSION: No renal or ureteral stone or obstruction.   Electronically Signed   By: Burman NievesWilliam  Stevens M.D.   On: 12/16/2014 23:05     EKG Interpretation None      MDM   Final diagnoses:  Left flank pain    79 y.o. female with pertinent PMH of recent admission for TIA with unremarkable MRI, prior nephrolithiasis presents with L flank and abd pain as above.  Exam with reproduction of pain on palpation of lumbar paraspinal muscles, exacerbated with movement. No rash appreciated.  Workup as above unremarkable for nephrolithiasis or other acute pathology. Discussed broad differential with patient including early zoster versus lumbar strain. Also gave strict return precautions for any worsening or changing symptoms. Patient voiced understanding, agreed to follow-up.  I have reviewed all laboratory and imaging studies if ordered as above  1. Left flank pain         Mirian MoMatthew Gentry, MD 12/17/14 0010

## 2014-12-17 MED ORDER — DIPHENHYDRAMINE HCL 25 MG PO CAPS
25.0000 mg | ORAL_CAPSULE | Freq: Once | ORAL | Status: AC
  Administered 2014-12-17: 25 mg via ORAL
  Filled 2014-12-17: qty 1

## 2014-12-17 MED ORDER — OXYCODONE-ACETAMINOPHEN 5-325 MG PO TABS: 1.0000 | ORAL_TABLET | ORAL | Status: DC | PRN

## 2014-12-17 NOTE — Discharge Instructions (Signed)

## 2014-12-17 NOTE — ED Notes (Signed)
Some itching after she was given toradol  No hives

## 2014-12-17 NOTE — ED Notes (Signed)
Pt given more pain for 9/10 pain.  Will need to wait for 30 minutes to be discharged.  Pt watching tv

## 2015-01-05 ENCOUNTER — Encounter: Payer: Self-pay | Admitting: Cardiovascular Disease

## 2015-01-19 ENCOUNTER — Other Ambulatory Visit: Payer: Self-pay | Admitting: Orthopedic Surgery

## 2015-01-19 DIAGNOSIS — M4310 Spondylolisthesis, site unspecified: Secondary | ICD-10-CM

## 2015-01-26 ENCOUNTER — Ambulatory Visit
Admission: RE | Admit: 2015-01-26 | Discharge: 2015-01-26 | Disposition: A | Discharge status: Home / Self Care | Payer: Medicare Other | Source: Ambulatory Visit | Attending: Orthopedic Surgery | Admitting: Orthopedic Surgery

## 2015-01-26 ENCOUNTER — Other Ambulatory Visit: Payer: Self-pay | Admitting: Orthopedic Surgery

## 2015-01-26 VITALS — BP 149/71 | HR 83

## 2015-01-26 DIAGNOSIS — M4310 Spondylolisthesis, site unspecified: Secondary | ICD-10-CM

## 2015-01-26 DIAGNOSIS — R269 Unspecified abnormalities of gait and mobility: Secondary | ICD-10-CM

## 2015-01-26 MED ORDER — METHYLPREDNISOLONE ACETATE 40 MG/ML INJ SUSP (RADIOLOG
120.0000 mg | Freq: Once | INTRAMUSCULAR | Status: AC
  Administered 2015-01-26: 120 mg via EPIDURAL

## 2015-01-26 MED ORDER — IOHEXOL 180 MG/ML  SOLN
1.0000 mL | Freq: Once | INTRAMUSCULAR | Status: DC | PRN
  Administered 2015-01-26: 1 mL via EPIDURAL

## 2015-01-26 NOTE — Discharge Instructions (Signed)

## 2015-05-03 ENCOUNTER — Ambulatory Visit: Payer: Medicare Other | Admitting: Neurology

## 2015-06-13 DIAGNOSIS — M545 Low back pain: Secondary | ICD-10-CM | POA: Diagnosis not present

## 2015-06-13 DIAGNOSIS — G5711 Meralgia paresthetica, right lower limb: Secondary | ICD-10-CM | POA: Diagnosis not present

## 2015-06-13 DIAGNOSIS — G8929 Other chronic pain: Secondary | ICD-10-CM | POA: Diagnosis not present

## 2015-07-04 DIAGNOSIS — M545 Low back pain: Secondary | ICD-10-CM | POA: Diagnosis not present

## 2015-07-19 DIAGNOSIS — M545 Low back pain: Secondary | ICD-10-CM | POA: Diagnosis not present

## 2015-07-19 DIAGNOSIS — M5126 Other intervertebral disc displacement, lumbar region: Secondary | ICD-10-CM | POA: Diagnosis not present

## 2015-07-19 DIAGNOSIS — M5136 Other intervertebral disc degeneration, lumbar region: Secondary | ICD-10-CM | POA: Diagnosis not present

## 2015-07-24 DIAGNOSIS — M25551 Pain in right hip: Secondary | ICD-10-CM | POA: Diagnosis not present

## 2015-07-24 DIAGNOSIS — Z6836 Body mass index (BMI) 36.0-36.9, adult: Secondary | ICD-10-CM | POA: Diagnosis not present

## 2015-07-24 DIAGNOSIS — I10 Essential (primary) hypertension: Secondary | ICD-10-CM | POA: Diagnosis not present

## 2015-07-24 DIAGNOSIS — N390 Urinary tract infection, site not specified: Secondary | ICD-10-CM | POA: Diagnosis not present

## 2015-09-23 ENCOUNTER — Encounter (HOSPITAL_COMMUNITY): Payer: Self-pay | Admitting: Emergency Medicine

## 2015-09-23 ENCOUNTER — Emergency Department (HOSPITAL_COMMUNITY)
Admission: EM | Admit: 2015-09-23 | Discharge: 2015-09-23 | Disposition: A | Discharge status: Home / Self Care | Payer: PPO | Attending: Emergency Medicine | Admitting: Emergency Medicine

## 2015-09-23 DIAGNOSIS — I10 Essential (primary) hypertension: Secondary | ICD-10-CM | POA: Diagnosis not present

## 2015-09-23 DIAGNOSIS — H547 Unspecified visual loss: Secondary | ICD-10-CM | POA: Diagnosis not present

## 2015-09-23 DIAGNOSIS — E119 Type 2 diabetes mellitus without complications: Secondary | ICD-10-CM | POA: Diagnosis not present

## 2015-09-23 DIAGNOSIS — J45909 Unspecified asthma, uncomplicated: Secondary | ICD-10-CM | POA: Diagnosis not present

## 2015-09-23 DIAGNOSIS — Z7982 Long term (current) use of aspirin: Secondary | ICD-10-CM | POA: Diagnosis not present

## 2015-09-23 DIAGNOSIS — Z880 Allergy status to penicillin: Secondary | ICD-10-CM | POA: Diagnosis not present

## 2015-09-23 DIAGNOSIS — Z79899 Other long term (current) drug therapy: Secondary | ICD-10-CM | POA: Insufficient documentation

## 2015-09-23 DIAGNOSIS — Z9889 Other specified postprocedural states: Secondary | ICD-10-CM | POA: Diagnosis not present

## 2015-09-23 DIAGNOSIS — Z7951 Long term (current) use of inhaled steroids: Secondary | ICD-10-CM | POA: Diagnosis not present

## 2015-09-23 DIAGNOSIS — Z87442 Personal history of urinary calculi: Secondary | ICD-10-CM | POA: Diagnosis not present

## 2015-09-23 DIAGNOSIS — M1990 Unspecified osteoarthritis, unspecified site: Secondary | ICD-10-CM | POA: Insufficient documentation

## 2015-09-23 DIAGNOSIS — F17210 Nicotine dependence, cigarettes, uncomplicated: Secondary | ICD-10-CM | POA: Diagnosis not present

## 2015-09-23 DIAGNOSIS — Z8719 Personal history of other diseases of the digestive system: Secondary | ICD-10-CM | POA: Insufficient documentation

## 2015-09-23 MED ORDER — ATROPINE SULFATE 1 % OP SOLN
2.0000 [drp] | Freq: Once | OPHTHALMIC | Status: AC
  Administered 2015-09-23: 2 [drp] via OPHTHALMIC
  Filled 2015-09-23: qty 2

## 2015-09-23 MED ORDER — OXYCODONE-ACETAMINOPHEN 5-325 MG PO TABS: 1.0000 | ORAL_TABLET | Freq: Once | ORAL | Status: DC

## 2015-09-23 MED ORDER — OXYCODONE-ACETAMINOPHEN 5-325 MG PO TABS: 1.0000 | ORAL_TABLET | ORAL | Status: DC | PRN

## 2015-09-23 MED ORDER — TETRACAINE HCL 0.5 % OP SOLN
2.0000 [drp] | Freq: Once | OPHTHALMIC | Status: AC
  Administered 2015-09-23: 2 [drp] via OPHTHALMIC
  Filled 2015-09-23: qty 4

## 2015-09-23 NOTE — ED Provider Notes (Signed)
CSN: [id number]     Arrival date & time 09/23/15  1318 History   First MD Initiated Contact with Patient 09/23/15 1404     Chief Complaint  Patient presents with  . Loss of Vision     (Consider location/radiation/quality/duration/timing/severity/associated sxs/prior Treatment) HPI Patient reports a fairly long history of intermittent "black floaters" in her vision. She reports she is a been bilateral. She does were glasses. She reports she is scheduled to see the ophthalmologist but she is "overdue". Patient states that yesterday she started noticing more "brownish red floaters" in her left vision. She went to sleep and awakened in the middle of the night noting that her left eye has significantly decreased vision. She reports she can't see anything however she describes seeing colors and very blurred images that look like they're in a dense fall. She does not describe any partial loss of the visual field. She feels that her right vision is at its normal baseline. She does note some sensitivity of the eye to light and movement. There has not been any redness or drainage. She denies any other associated symptoms of weakness numbness tingling or incoordination. Past Medical History  Diagnosis Date  . Hypertension   . Complication of anesthesia     WAKE UP DURING KNEE ARTHROSCOPY  . Kidney stones   . Headache(784.0)   . High cholesterol   . Asthma   . Chronic bronchitis (HCC)     "get it qd" (02/20/2012)  . Sleep apnea     10 + 12 YRS NO MACHINE  . Type II diabetes mellitus (HCC)   . Hypothyroidism     "used to take Synthroid; don't take anything now" (02/20/2012)  . GERD (gastroesophageal reflux disease)   . Lactose intolerance   . Arthritis     "full body" (02/20/2012)  . Fibromyalgia   . Hemorrhoids   . Fibromyalgia   . Cervical spondylosis with myelopathy   . Carotid stenosis   . Irritable colon   . Asthma   . HLD (hyperlipidemia)   . H/O echocardiogram   . Abnormal myocardial  perfusion study     lung/heart ratio was measured at 0.29   Past Surgical History  Procedure Laterality Date  . Elbow surgery      bilateral; "left for fluid; right to get rid of nerve/muscles fusing themselves together" 02/20/2012  . Shoulder arthroscopy      RIGHT  . Knee arthroscopy      BILATERAL   . Total knee arthroplasty  02/20/2012    left  . Tonsillectomy  1950's  . Appendectomy  1950's  . Abdominal hysterectomy  ~ 1979  . Cardiac catheterization    . Mastectomy complete / simple      "right; came back clean; simple"  . Breast biopsy      bilaterally  . Cystoscopy w/ ureteral stent placement  1970's?  . Cystoscopy w/ ureteral stent removal  1970's?  . Total knee arthroplasty  02/20/2012    Procedure: TOTAL KNEE ARTHROPLASTY;  Surgeon: Kennieth Rad, MD;  Location: [facility] OR;  Service: Orthopedics;  Laterality: Left;   Family History  Problem Relation Age of Onset  . Alzheimer's disease Mother 85   Social History  Substance Use Topics  . Smoking status: Current Some Day Smoker -- 0 years    Types: Cigarettes  . Smokeless tobacco: Never Used     Comment: 02/20/2012 "once or twice/wk couple cigarettes each time"  . Alcohol Use: No  OB History    No data available     Review of Systems  10 Systems reviewed and are negative for acute change except as noted in the HPI.   Allergies  Cephalexin; Lactose intolerance (gi); Morphine and related; Penicillins; and Tuberculin tests  Home Medications   Prior to Admission medications   Medication Sig Start Date End Date Taking? Authorizing Provider  aspirin 81 MG chewable tablet Chew 1 tablet (81 mg total) by mouth daily. 12/13/14  Yes Belkys A Regalado, MD  budesonide-formoterol (SYMBICORT) 160-4.5 MCG/ACT inhaler Inhale 2 puffs into the lungs as needed (for shortness of breath). 12/13/14  Yes Belkys A Regalado, MD  lisinopril-hydrochlorothiazide (PRINZIDE,ZESTORETIC) 20-12.5 MG tablet Take 1 tablet by mouth daily.  09/12/15   Yes Historical Provider, MD  albuterol (PROVENTIL HFA;VENTOLIN HFA) 108 (90 BASE) MCG/ACT inhaler Inhale 2 puffs into the lungs every 6 (six) hours as needed for wheezing or shortness of breath. Patient not taking: Reported on 09/23/2015 01/18/13   Nonie Hoyer, PA-C  lisinopril (PRINIVIL,ZESTRIL) 20 MG tablet Take 1 tablet (20 mg total) by mouth daily. Patient not taking: Reported on 09/23/2015 12/14/14   Christiane Ha, MD  oxyCODONE-acetaminophen (PERCOCET) 5-325 MG tablet Take 1-2 tablets by mouth every 4 (four) hours as needed. 09/23/15   Arby Barrette, MD  oxyCODONE-acetaminophen (PERCOCET/ROXICET) 5-325 MG per tablet Take 1-2 tablets by mouth every 4 (four) hours as needed for severe pain. Patient not taking: Reported on 09/23/2015 12/17/14   Mirian Mo, MD   BP 154/68 mmHg  Pulse 63  Temp(Src) 99.1 F (37.3 C) (Oral)  Resp 18  SpO2 98% Physical Exam  Constitutional: She is oriented to person, place, and time. She appears well-developed and well-nourished.  Patient is nontoxic and alert. No respiratory distress. Mental status is clear and interactive.  HENT:  Head: Normocephalic and atraumatic.  Right Ear: External ear normal.  Left Ear: External ear normal.  Nose: Nose normal.  Mouth/Throat: Oropharynx is clear and moist.  Eyes: Conjunctivae and EOM are normal. Pupils are equal, round, and reactive to light. Right eye exhibits no discharge. Left eye exhibits no discharge.  Tonopen IOP 15 B/L. No scleral injection, no proptosis, no tearing. Slit-lamp examination, surface of the cornea is intact. Unable to visualize the retina. Attempted to dilate the eye but patient cooperation for fixed vision was challenging as well as inability to see past the lens.  Neck: Neck supple.  Cardiovascular: Normal rate, regular rhythm, normal heart sounds and intact distal pulses.   Pulmonary/Chest: Effort normal and breath sounds normal.  Musculoskeletal: Normal range of motion. She exhibits  no edema or tenderness.  Neurological: She is alert and oriented to person, place, and time. She has normal strength. No cranial nerve deficit. She exhibits normal muscle tone. Coordination normal. GCS eye subscore is 4. GCS verbal subscore is 5. GCS motor subscore is 6.  Skin: Skin is warm, dry and intact.  Psychiatric: She has a normal mood and affect.    ED Course  Procedures (including critical care time) Labs Review Labs Reviewed - No data to display  Imaging Review No results found. I have personally reviewed and evaluated these images and lab results as part of my medical decision-making.   EKG Interpretation None     Consultation: reviewed Dr. Dione Booze who will see the patient in his office today or tomorrow. He has provided direct contact number for the patient to use. MDM   Final diagnoses:  Visual loss   Patient  presents with decreased vision. There are no visual field cuts to suggest strokelike presentation. Visual loss is unilateral to the left with a quality being of dense blurring. Patient distinguishes colors and shapes. No associated neurologic deficits. No recent general illness. No signs of infectious etiology. Knowledge he has been consult it and Dr. Dione BoozeGroat will see the patient for more detailed ocular exam today or tomorrow at the patient's convenience.    Arby BarretteMarcy Kazuma Elena, MD 09/23/15 [phone]

## 2015-09-23 NOTE — ED Notes (Signed)
I re-iterated the urgent need for her to contact Dr. Dione BoozeGroat asap to meet him at his office for comprehensive ophthalmologic exam and showed her the (large print, bold) phone number for Dr. Dione BoozeGroat on page 2 of instructions.

## 2015-09-23 NOTE — Discharge Instructions (Signed)
Visual Disturbances You need to be seen by the ophthalmologist, Dr. Dione BoozeGroat today or tomorrow. Call him at [phone], he will see you in his office today or tomorrow. This is important because if you need surgical treatment, it needs to be done as soon as possible. You have had a disturbance in your vision. This may be caused by various conditions, such as:  Migraines. Migraine headaches are often preceded by a disturbance in vision. Blind spots or light flashes are followed by a headache. This type of visual disturbance is temporary. It does not damage the eye.  Glaucoma. This is caused by increased pressure in the eye. Symptoms include haziness, blurred vision, or seeing rainbow colored circles when looking at bright lights. Partial or complete visual loss can occur. You may or may not experience eye pain. Visual loss may be gradual or sudden and is irreversible. Glaucoma is the leading cause of blindness.  Retina problems. Vision will be reduced if the retina becomes detached or if there is a circulation problem as with diabetes, high blood pressure, or a mini-stroke. Symptoms include seeing "floaters," flashes of light, or shadows, as if a curtain has fallen over your eye.  Optic nerve problems. The main nerve in your eye can be damaged by redness, soreness, and swelling (inflammation), poor circulation, drugs, and toxins. It is very important to have a complete exam done by a specialist to determine the exact cause of your eye problem. The specialist may recommend medicines or surgery, depending on the cause of the problem. This can help prevent further loss of vision or reduce the risk of having a stroke. Contact the caregiver to whom you have been referred and arrange for follow-up care right away. SEEK IMMEDIATE MEDICAL CARE IF:   Your vision gets worse.  You develop severe headaches.  You have any weakness or numbness in the face, arms, or legs.  You have any trouble speaking or  walking.   This information is not intended to replace advice given to you by your health care provider. Make sure you discuss any questions you have with your health care provider.   Document Released: 06/27/2004 Document Revised: 08/12/2011 Document Reviewed: 10/27/2013 Elsevier Interactive Patient Education [employer]2016 [employer] Inc[employer]

## 2015-09-23 NOTE — ED Notes (Signed)
Pt reports she began to lose vision in her L eye last night. Describes as a curtain coming across her eye. Hx of DM and HTN. No known eye problems. Eye is painful worse with palpation. No visible changes to external eye.

## 2015-09-24 DIAGNOSIS — H43392 Other vitreous opacities, left eye: Secondary | ICD-10-CM | POA: Diagnosis not present

## 2015-09-24 DIAGNOSIS — H43813 Vitreous degeneration, bilateral: Secondary | ICD-10-CM | POA: Diagnosis not present

## 2015-09-24 DIAGNOSIS — H25813 Combined forms of age-related cataract, bilateral: Secondary | ICD-10-CM | POA: Diagnosis not present

## 2015-10-03 DIAGNOSIS — H43813 Vitreous degeneration, bilateral: Secondary | ICD-10-CM | POA: Diagnosis not present

## 2015-10-03 DIAGNOSIS — H43392 Other vitreous opacities, left eye: Secondary | ICD-10-CM | POA: Diagnosis not present

## 2015-10-03 DIAGNOSIS — H25812 Combined forms of age-related cataract, left eye: Secondary | ICD-10-CM | POA: Diagnosis not present

## 2015-10-03 DIAGNOSIS — H25813 Combined forms of age-related cataract, bilateral: Secondary | ICD-10-CM | POA: Diagnosis not present

## 2015-10-18 DIAGNOSIS — H269 Unspecified cataract: Secondary | ICD-10-CM | POA: Diagnosis not present

## 2015-10-18 DIAGNOSIS — H2181 Floppy iris syndrome: Secondary | ICD-10-CM | POA: Diagnosis not present

## 2015-10-18 DIAGNOSIS — H2512 Age-related nuclear cataract, left eye: Secondary | ICD-10-CM | POA: Diagnosis not present

## 2015-11-16 ENCOUNTER — Other Ambulatory Visit: Payer: Self-pay | Admitting: Neurosurgery

## 2015-11-16 ENCOUNTER — Other Ambulatory Visit (HOSPITAL_COMMUNITY): Payer: Self-pay | Admitting: Neurosurgery

## 2015-11-16 DIAGNOSIS — M4316 Spondylolisthesis, lumbar region: Secondary | ICD-10-CM

## 2015-11-30 ENCOUNTER — Ambulatory Visit (HOSPITAL_COMMUNITY)
Admission: RE | Admit: 2015-11-30 | Discharge: 2015-11-30 | Disposition: A | Discharge status: Home / Self Care | Payer: PPO | Source: Ambulatory Visit | Attending: Neurosurgery | Admitting: Neurosurgery

## 2015-11-30 DIAGNOSIS — M4726 Other spondylosis with radiculopathy, lumbar region: Secondary | ICD-10-CM | POA: Insufficient documentation

## 2015-11-30 DIAGNOSIS — M4316 Spondylolisthesis, lumbar region: Secondary | ICD-10-CM

## 2015-11-30 DIAGNOSIS — M4724 Other spondylosis with radiculopathy, thoracic region: Secondary | ICD-10-CM | POA: Diagnosis not present

## 2015-11-30 DIAGNOSIS — M5136 Other intervertebral disc degeneration, lumbar region: Secondary | ICD-10-CM | POA: Diagnosis not present

## 2015-11-30 DIAGNOSIS — M5116 Intervertebral disc disorders with radiculopathy, lumbar region: Secondary | ICD-10-CM | POA: Insufficient documentation

## 2015-11-30 DIAGNOSIS — M5134 Other intervertebral disc degeneration, thoracic region: Secondary | ICD-10-CM | POA: Diagnosis not present

## 2015-11-30 MED ORDER — OXYCODONE HCL 5 MG PO TABS
ORAL_TABLET | ORAL | Status: AC
  Filled 2015-11-30: qty 2

## 2015-11-30 MED ORDER — LIDOCAINE HCL 1 % IJ SOLN
INTRAMUSCULAR | Status: AC
  Filled 2015-11-30: qty 10

## 2015-11-30 MED ORDER — LIDOCAINE HCL (PF) 1 % IJ SOLN
10.0000 mL | Freq: Once | INTRAMUSCULAR | Status: AC
  Administered 2015-11-30: 5 mL via INTRADERMAL

## 2015-11-30 MED ORDER — OXYCODONE HCL 5 MG PO TABS
5.0000 mg | ORAL_TABLET | ORAL | Status: DC | PRN
  Administered 2015-11-30: 10 mg via ORAL

## 2015-11-30 MED ORDER — TRAMADOL HCL 50 MG PO TABS: 50.0000 mg | ORAL_TABLET | ORAL | Status: DC | PRN

## 2015-11-30 MED ORDER — DIAZEPAM 5 MG PO TABS
ORAL_TABLET | ORAL | Status: AC
  Filled 2015-11-30: qty 2

## 2015-11-30 MED ORDER — ONDANSETRON HCL 4 MG/2ML IJ SOLN: 4.0000 mg | Freq: Four times a day (QID) | INTRAMUSCULAR | Status: DC | PRN

## 2015-11-30 MED ORDER — OXYCODONE HCL 5 MG PO TABS
ORAL_TABLET | ORAL | Status: AC
  Filled 2015-11-30: qty 1

## 2015-11-30 MED ORDER — IOPAMIDOL (ISOVUE-M 200) INJECTION 41%
INTRAMUSCULAR | Status: AC
  Administered 2015-11-30: 20 mL via INTRATHECAL
  Filled 2015-11-30: qty 10

## 2015-11-30 MED ORDER — DIAZEPAM 5 MG PO TABS
10.0000 mg | ORAL_TABLET | Freq: Once | ORAL | Status: AC
  Administered 2015-11-30: 10 mg via ORAL

## 2015-11-30 MED ORDER — LIDOCAINE HCL (PF) 1 % IJ SOLN
10.0000 mL | Freq: Once | INTRAMUSCULAR | Status: AC
  Administered 2015-11-30: 10 mL via INTRADERMAL

## 2015-11-30 MED ORDER — IOPAMIDOL (ISOVUE-M 200) INJECTION 41%
20.0000 mL | Freq: Once | INTRAMUSCULAR | Status: AC
  Administered 2015-11-30: 20 mL via INTRATHECAL

## 2015-11-30 NOTE — Discharge Instructions (Signed)

## 2015-11-30 NOTE — Op Note (Signed)
*   No surgery found * Lumbar Myelogram  PATIENT:  Jean Aguilar is a 80 y.o. female  PRE-OPERATIVE DIAGNOSIS:  Lumbar osteoarthritis with radiculopathy  POST-OPERATIVE DIAGNOSIS:  same  PROCEDURE:  Lumbar Myelogram  SURGEON:  Kaidyn Javid  ANESTHESIA:   local LOCAL MEDICATIONS USED:  LIDOCAINE  and Amount: 16 ml Procedure Note: Jean Aguilar is a 80 y.o. female Was taken to the fluoroscopy suite and  positioned prone on the fluoroscopy table. Her back was prepared and draped in a sterile manner. I infiltrated 16 cc into the lumbar region. I then introduced a spinal needle into the thecal sac at the L3/4 interlaminar space. I infiltrated 20cc of Omnipaque 180 into the thecal sac. Fluoroscopy showed the needle and contrast in the thecal sac. Jean Aguilar tolerated the procedure well. she Will be taken to CT for evaluation.     PATIENT DISPOSITION:  PACU - hemodynamically stable.

## 2015-12-07 DIAGNOSIS — M4316 Spondylolisthesis, lumbar region: Secondary | ICD-10-CM | POA: Diagnosis not present

## 2015-12-07 DIAGNOSIS — M25551 Pain in right hip: Secondary | ICD-10-CM | POA: Diagnosis not present

## 2015-12-07 DIAGNOSIS — Z6834 Body mass index (BMI) 34.0-34.9, adult: Secondary | ICD-10-CM | POA: Diagnosis not present

## 2015-12-12 DIAGNOSIS — M25551 Pain in right hip: Secondary | ICD-10-CM | POA: Diagnosis not present

## 2015-12-12 DIAGNOSIS — M545 Low back pain: Secondary | ICD-10-CM | POA: Diagnosis not present

## 2016-01-02 DIAGNOSIS — M25551 Pain in right hip: Secondary | ICD-10-CM | POA: Diagnosis not present

## 2016-01-02 DIAGNOSIS — M545 Low back pain: Secondary | ICD-10-CM | POA: Diagnosis not present

## 2016-01-12 DIAGNOSIS — M25551 Pain in right hip: Secondary | ICD-10-CM | POA: Diagnosis not present

## 2016-02-13 DIAGNOSIS — J449 Chronic obstructive pulmonary disease, unspecified: Secondary | ICD-10-CM | POA: Diagnosis not present

## 2016-02-13 DIAGNOSIS — Z23 Encounter for immunization: Secondary | ICD-10-CM | POA: Diagnosis not present

## 2016-02-13 DIAGNOSIS — E559 Vitamin D deficiency, unspecified: Secondary | ICD-10-CM | POA: Diagnosis not present

## 2016-02-13 DIAGNOSIS — N3080 Other cystitis without hematuria: Secondary | ICD-10-CM | POA: Diagnosis not present

## 2016-02-13 DIAGNOSIS — L259 Unspecified contact dermatitis, unspecified cause: Secondary | ICD-10-CM | POA: Diagnosis not present

## 2016-02-13 DIAGNOSIS — M4307 Spondylolysis, lumbosacral region: Secondary | ICD-10-CM | POA: Diagnosis not present

## 2016-02-13 DIAGNOSIS — E784 Other hyperlipidemia: Secondary | ICD-10-CM | POA: Diagnosis not present

## 2016-02-13 DIAGNOSIS — I10 Essential (primary) hypertension: Secondary | ICD-10-CM | POA: Diagnosis not present

## 2016-02-13 DIAGNOSIS — E119 Type 2 diabetes mellitus without complications: Secondary | ICD-10-CM | POA: Diagnosis not present

## 2016-02-20 DIAGNOSIS — M545 Low back pain: Secondary | ICD-10-CM | POA: Diagnosis not present

## 2016-02-20 DIAGNOSIS — M7061 Trochanteric bursitis, right hip: Secondary | ICD-10-CM | POA: Diagnosis not present

## 2016-03-18 DIAGNOSIS — M7061 Trochanteric bursitis, right hip: Secondary | ICD-10-CM | POA: Diagnosis not present

## 2016-03-25 DIAGNOSIS — Z23 Encounter for immunization: Secondary | ICD-10-CM | POA: Diagnosis not present

## 2016-03-25 DIAGNOSIS — E119 Type 2 diabetes mellitus without complications: Secondary | ICD-10-CM | POA: Diagnosis not present

## 2016-03-25 DIAGNOSIS — I10 Essential (primary) hypertension: Secondary | ICD-10-CM | POA: Diagnosis not present

## 2016-03-25 DIAGNOSIS — M4307 Spondylolysis, lumbosacral region: Secondary | ICD-10-CM | POA: Diagnosis not present

## 2016-03-25 DIAGNOSIS — J4520 Mild intermittent asthma, uncomplicated: Secondary | ICD-10-CM | POA: Diagnosis not present

## 2016-06-13 DIAGNOSIS — M7061 Trochanteric bursitis, right hip: Secondary | ICD-10-CM | POA: Diagnosis not present

## 2016-08-25 IMAGING — MR MR MRA HEAD W/O CM
10 of 11 series · 30 of 48 positions shown · non-contrast
Comparison: Head CT without contrast 7810 hr today.

CLINICAL DATA: 79-year-old female with acute onset abnormal gait
since yesterday, and word finding difficulties. Initial encounter.

EXAM:
MRI HEAD WITHOUT CONTRAST
MRA HEAD WITHOUT CONTRAST
TECHNIQUE: Multiplanar, multiecho pulse sequences of the brain and surrounding
structures were obtained without intravenous contrast. Angiographic
images of the head were obtained using MRA technique without
contrast.

[Series 3: DWI · axial · 3.0mm · 1.09mm/px · z∈[-65,+75]mm · 6 of 98 slices shown (1 of 4)]
[im 1/98]
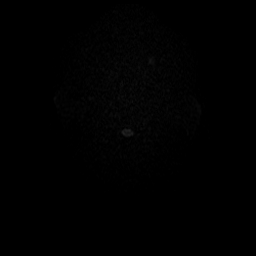
[im 20/98]
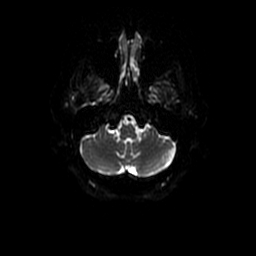
[im 39/98]
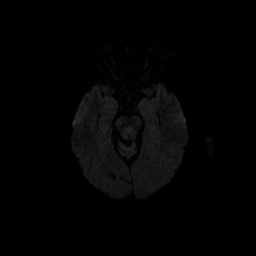
[im 59/98]
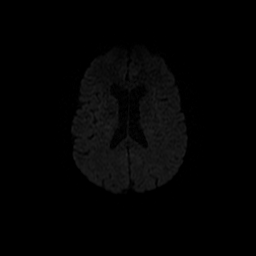
[im 78/98]
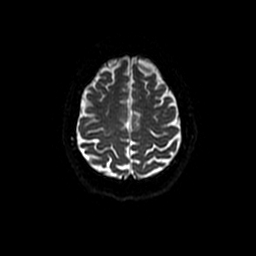
[im 98/98]
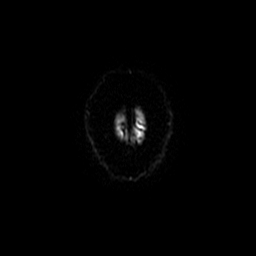

[Series 5: ax mpgr · axial · 5.0mm · 0.43mm/px · z∈[-49,+86]mm · 2 of 24 slices shown]
[im 1/24]
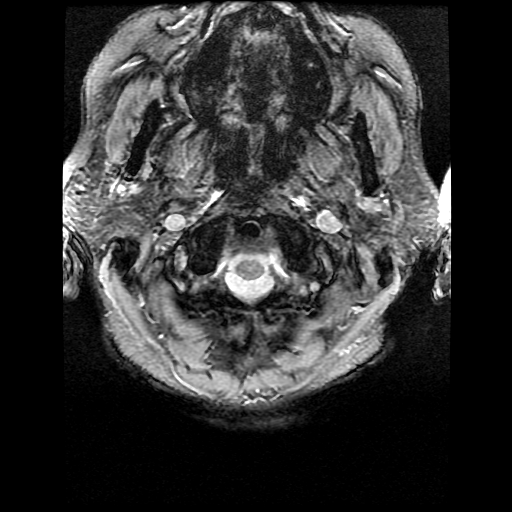
[im 24/24]
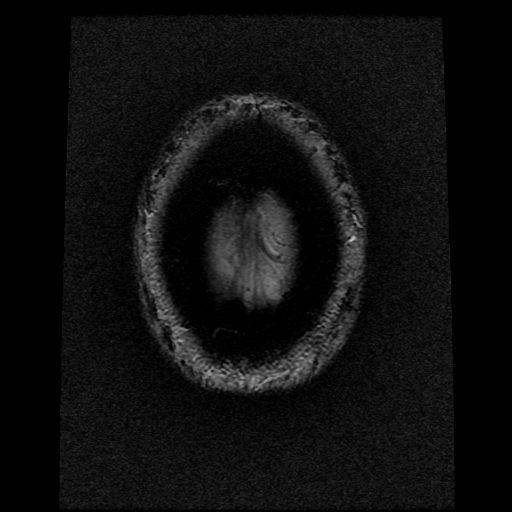

[Series 6: DWI · coronal · 5.0mm · 1.09mm/px · 5 of 66 slices shown (2 of 4)]
[im 1/66]
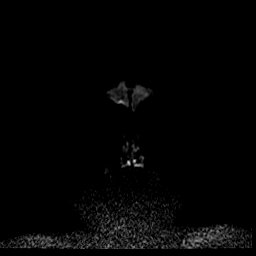
[im 17/66]
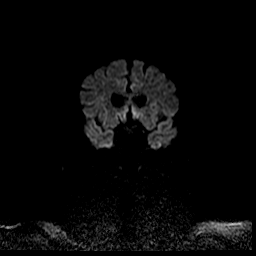
[im 33/66]
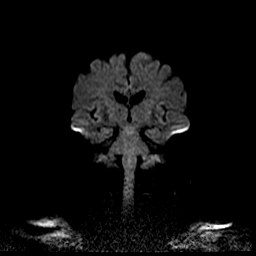
[im 49/66]
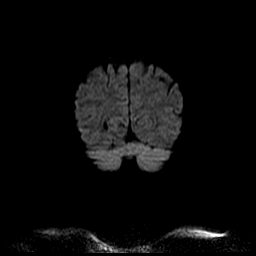
[im 66/66]
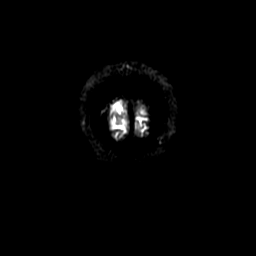

[Series 7: T2 · axial · 5.0mm · 0.43mm/px · z∈[-49,+86]mm · 2 of 24 slices shown (1 of 2)]
[im 1/24]
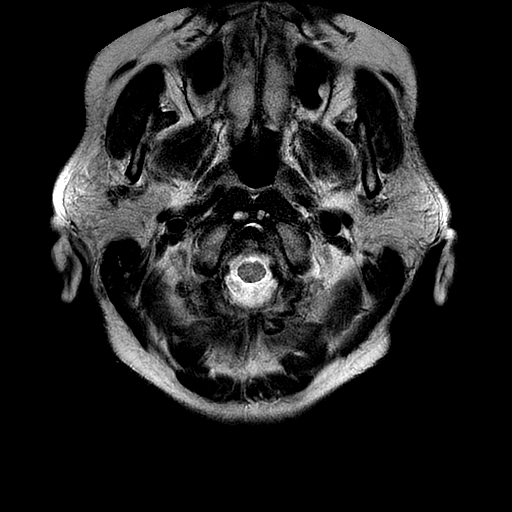
[im 24/24]
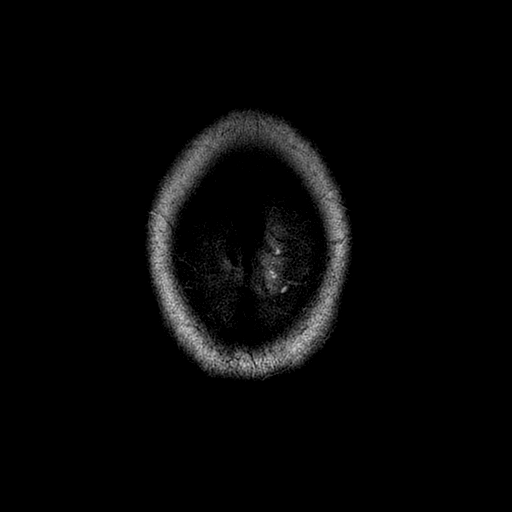

[Series 8: (id) mt fs · axial · 1.2mm · 0.45mm/px · z∈[-67,-51]mm · 2 of 168 slices shown]
[im 1/168]
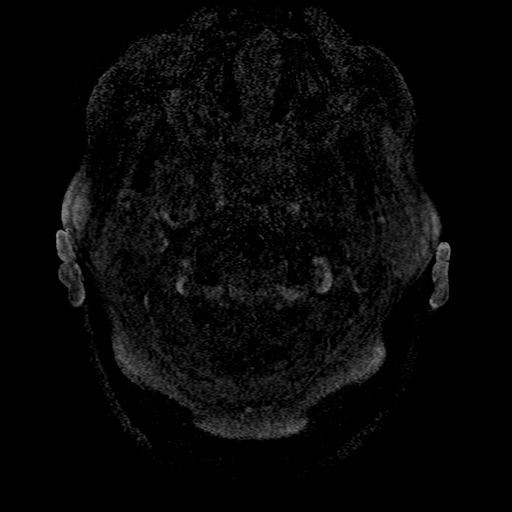
[im 28/168]
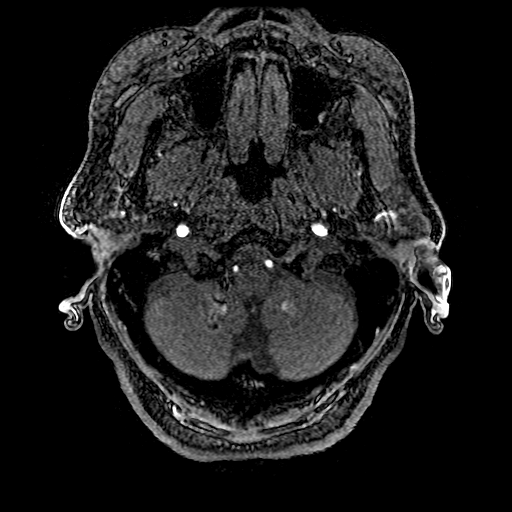

[Series 9: FLAIR · axial · 5.0mm · 0.43mm/px · z∈[-49,+86]mm · 2 of 24 slices shown]
[im 1/24]
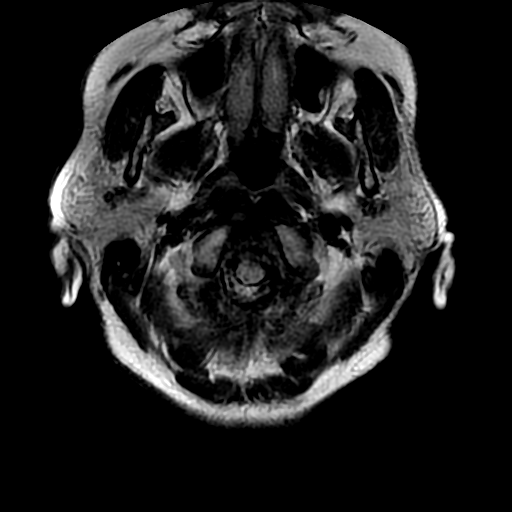
[im 24/24]
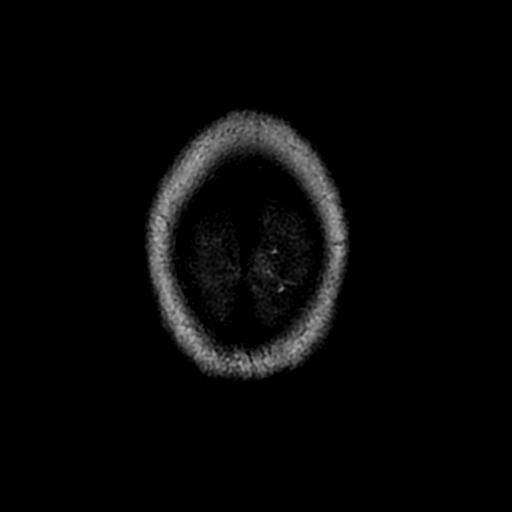

[Series 10: T1 · sagittal · 6.0mm · 0.47mm/px · 2 of 22 slices shown]
[im 1/22]
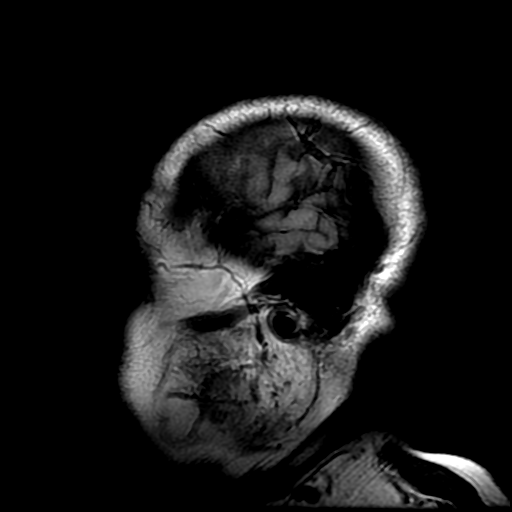
[im 22/22]
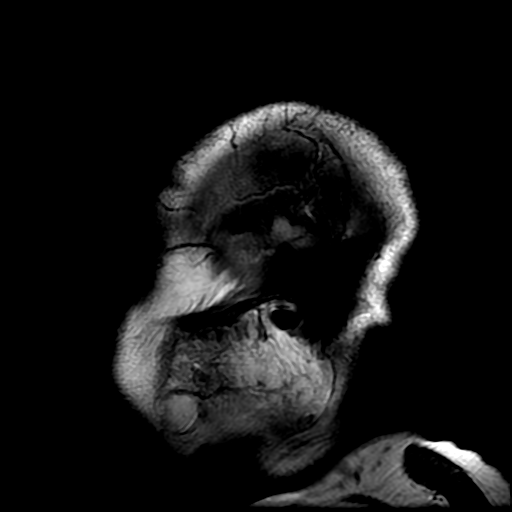

[Series 12: T2 · coronal · 5.0mm · 0.43mm/px · 2 of 28 slices shown (2 of 2)]
[im 1/28]
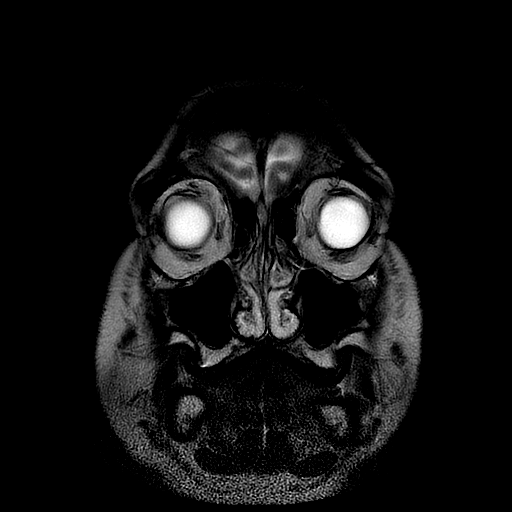
[im 28/28]
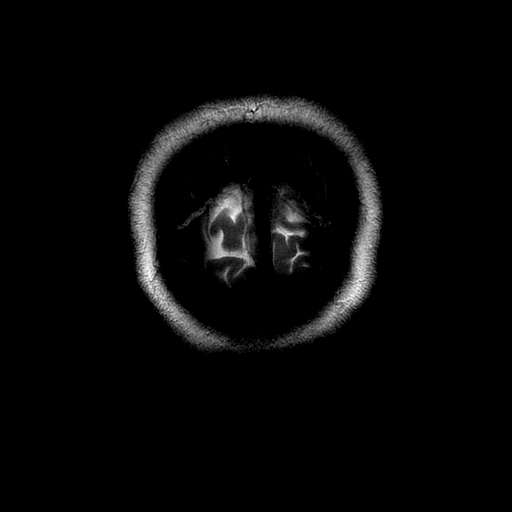

[Series 300: DWI · axial · 3.0mm · 1.09mm/px · z∈[-65,+75]mm · 4 of 49 slices shown (3 of 4)]
[im 1/49]
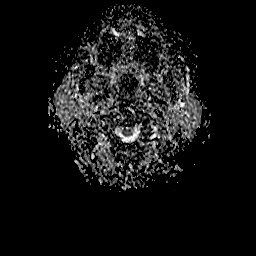
[im 17/49]
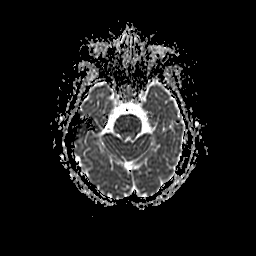
[im 33/49]
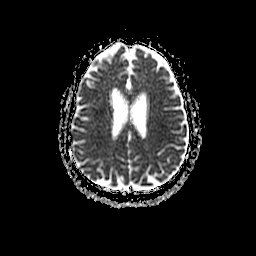
[im 49/49]
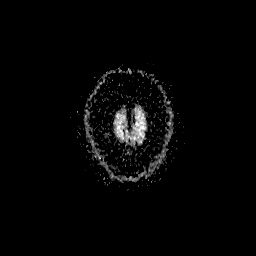

[Series 600: DWI · coronal · 5.0mm · 1.09mm/px · 3 of 33 slices shown (4 of 4)]
[im 1/33]
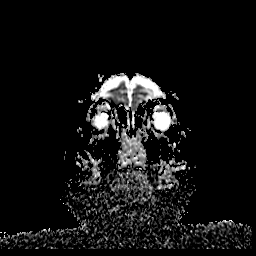
[im 17/33]
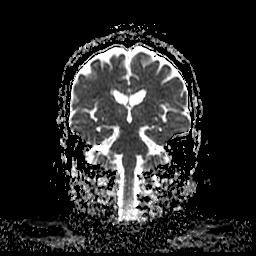
[im 33/33]
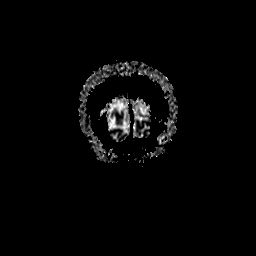

[30 of 48 positions shown; findings below may reference images not displayed]

FINDINGS: MRI HEAD FINDINGS

Cerebral volume is within normal limits for age. No restricted
diffusion to suggest acute infarction. No midline shift, mass
effect, evidence of mass lesion, ventriculomegaly, extra-axial
collection or acute intracranial hemorrhage. Cervicomedullary
junction and pituitary are within normal limits. Major intracranial
vascular flow voids are within normal limits.

There is a small chronic micro hemorrhage in the anterior left
frontal lobe on series 5, image 14. Otherwise, there is a small
chronic lacunar infarct in the right cerebellar hemisphere on series
7, image 6. Otherwise, gray and white matter signal is within normal
limits for age throughout the brain.

Visible internal auditory structures appear normal. Trace mastoid
fluid. Negative nasopharynx. Trace paranasal sinus mucosal
thickening. Orbits soft tissues appear normal. Negative scalp soft
tissues. Normal bone marrow signal. Negative visualized cervical
spine.

MRA HEAD FINDINGS

Antegrade flow in the posterior circulation, mildly dominant distal
left vertebral artery. Normal right PICA origin and vertebrobasilar
junction. Normal AICA origins, the left AICA appears duplicated. No
basilar artery stenosis. Normal SCA and PCA origins. There is mild
irregularity of the left P1 and P2 segments with preserved distal
flow. Right PCA branches are within normal limits. Posterior
communicating arteries are diminutive or absent.

Antegrade flow in both ICA siphons. Cavernous ICA irregularity worse
on the left where extensive calcified plaque was demonstrated on CT.
Moderate left ICA siphon stenosis. No significant siphon stenosis on
the right. Small infundibulum of the left ophthalmic artery origin.
Normal carotid termini, MCA and ACA origins. Diminutive or absent
anterior communicating artery. Visualized bilateral ACA branches are
within normal limits. Visualized bilateral MCA branches are normal
aside from minimal to mild distal M2/ M3 segment irregularity.
IMPRESSION: 1. No acute intracranial abnormality. Mild for age chronic small
vessel disease.
2. Left ICA siphon atherosclerosis with moderate stenosis. Evidence
of mild atherosclerosis and stenosis involving the left PCA, and
distal bilateral MCA branches.

## 2016-08-26 DIAGNOSIS — I10 Essential (primary) hypertension: Secondary | ICD-10-CM | POA: Diagnosis not present

## 2016-08-26 DIAGNOSIS — M25551 Pain in right hip: Secondary | ICD-10-CM | POA: Diagnosis not present

## 2016-08-26 DIAGNOSIS — M4316 Spondylolisthesis, lumbar region: Secondary | ICD-10-CM | POA: Diagnosis not present

## 2016-08-26 DIAGNOSIS — Z6834 Body mass index (BMI) 34.0-34.9, adult: Secondary | ICD-10-CM | POA: Diagnosis not present

## 2016-09-05 DIAGNOSIS — R200 Anesthesia of skin: Secondary | ICD-10-CM | POA: Diagnosis not present

## 2016-09-05 DIAGNOSIS — M5416 Radiculopathy, lumbar region: Secondary | ICD-10-CM | POA: Diagnosis not present

## 2016-09-05 DIAGNOSIS — M9983 Other biomechanical lesions of lumbar region: Secondary | ICD-10-CM | POA: Diagnosis not present

## 2016-09-05 DIAGNOSIS — R29898 Other symptoms and signs involving the musculoskeletal system: Secondary | ICD-10-CM | POA: Diagnosis not present

## 2016-09-12 DIAGNOSIS — M4316 Spondylolisthesis, lumbar region: Secondary | ICD-10-CM | POA: Diagnosis not present

## 2016-09-12 DIAGNOSIS — M9983 Other biomechanical lesions of lumbar region: Secondary | ICD-10-CM | POA: Diagnosis not present

## 2016-09-12 DIAGNOSIS — M25551 Pain in right hip: Secondary | ICD-10-CM | POA: Diagnosis not present

## 2016-09-20 DIAGNOSIS — J302 Other seasonal allergic rhinitis: Secondary | ICD-10-CM | POA: Diagnosis not present

## 2016-09-20 DIAGNOSIS — F17200 Nicotine dependence, unspecified, uncomplicated: Secondary | ICD-10-CM | POA: Diagnosis not present

## 2016-09-20 DIAGNOSIS — M4316 Spondylolisthesis, lumbar region: Secondary | ICD-10-CM | POA: Diagnosis not present

## 2016-09-20 DIAGNOSIS — Z716 Tobacco abuse counseling: Secondary | ICD-10-CM | POA: Diagnosis not present

## 2016-09-20 DIAGNOSIS — J449 Chronic obstructive pulmonary disease, unspecified: Secondary | ICD-10-CM | POA: Diagnosis not present

## 2016-09-20 DIAGNOSIS — N3281 Overactive bladder: Secondary | ICD-10-CM | POA: Diagnosis not present

## 2016-09-20 DIAGNOSIS — I10 Essential (primary) hypertension: Secondary | ICD-10-CM | POA: Diagnosis not present

## 2016-09-20 DIAGNOSIS — E119 Type 2 diabetes mellitus without complications: Secondary | ICD-10-CM | POA: Diagnosis not present

## 2016-12-10 DIAGNOSIS — M25551 Pain in right hip: Secondary | ICD-10-CM | POA: Diagnosis not present

## 2016-12-10 DIAGNOSIS — M4316 Spondylolisthesis, lumbar region: Secondary | ICD-10-CM | POA: Diagnosis not present

## 2016-12-26 DIAGNOSIS — Z961 Presence of intraocular lens: Secondary | ICD-10-CM | POA: Diagnosis not present

## 2016-12-26 DIAGNOSIS — H25811 Combined forms of age-related cataract, right eye: Secondary | ICD-10-CM | POA: Diagnosis not present

## 2017-01-13 DIAGNOSIS — H2511 Age-related nuclear cataract, right eye: Secondary | ICD-10-CM | POA: Diagnosis not present

## 2017-01-15 DIAGNOSIS — H2511 Age-related nuclear cataract, right eye: Secondary | ICD-10-CM | POA: Diagnosis not present

## 2017-01-15 DIAGNOSIS — H25811 Combined forms of age-related cataract, right eye: Secondary | ICD-10-CM | POA: Diagnosis not present

## 2017-01-15 DIAGNOSIS — H2181 Floppy iris syndrome: Secondary | ICD-10-CM | POA: Diagnosis not present

## 2017-01-23 ENCOUNTER — Emergency Department (HOSPITAL_COMMUNITY)
Admission: EM | Admit: 2017-01-23 | Discharge: 2017-01-23 | Disposition: A | Discharge status: Home / Self Care | Payer: PPO | Attending: Emergency Medicine | Admitting: Emergency Medicine

## 2017-01-23 ENCOUNTER — Emergency Department (HOSPITAL_COMMUNITY): Payer: PPO

## 2017-01-23 DIAGNOSIS — I129 Hypertensive chronic kidney disease with stage 1 through stage 4 chronic kidney disease, or unspecified chronic kidney disease: Secondary | ICD-10-CM | POA: Insufficient documentation

## 2017-01-23 DIAGNOSIS — R42 Dizziness and giddiness: Secondary | ICD-10-CM | POA: Diagnosis not present

## 2017-01-23 DIAGNOSIS — N189 Chronic kidney disease, unspecified: Secondary | ICD-10-CM | POA: Insufficient documentation

## 2017-01-23 DIAGNOSIS — Z96652 Presence of left artificial knee joint: Secondary | ICD-10-CM | POA: Diagnosis not present

## 2017-01-23 DIAGNOSIS — E039 Hypothyroidism, unspecified: Secondary | ICD-10-CM | POA: Diagnosis not present

## 2017-01-23 DIAGNOSIS — J45909 Unspecified asthma, uncomplicated: Secondary | ICD-10-CM | POA: Insufficient documentation

## 2017-01-23 DIAGNOSIS — F17210 Nicotine dependence, cigarettes, uncomplicated: Secondary | ICD-10-CM | POA: Insufficient documentation

## 2017-01-23 DIAGNOSIS — I10 Essential (primary) hypertension: Secondary | ICD-10-CM

## 2017-01-23 DIAGNOSIS — R404 Transient alteration of awareness: Secondary | ICD-10-CM | POA: Diagnosis not present

## 2017-01-23 DIAGNOSIS — E1122 Type 2 diabetes mellitus with diabetic chronic kidney disease: Secondary | ICD-10-CM | POA: Diagnosis not present

## 2017-01-23 LAB — CBC WITH DIFFERENTIAL/PLATELET
BASOS PCT: 0 %
Basophils Absolute: 0 10*3/uL (ref 0.0–0.1)
EOS ABS: 0.2 10*3/uL (ref 0.0–0.7)
EOS PCT: 3 %
HCT: 35.2 % — ABNORMAL LOW (ref 36.0–46.0)
HEMOGLOBIN: 11.6 g/dL — AB (ref 12.0–15.0)
LYMPHS ABS: 2 10*3/uL (ref 0.7–4.0)
Lymphocytes Relative: 32 %
MCH: 30.1 pg (ref 26.0–34.0)
MCHC: 33 g/dL (ref 30.0–36.0)
MCV: 91.4 fL (ref 78.0–100.0)
MONOS PCT: 6 %
Monocytes Absolute: 0.4 10*3/uL (ref 0.1–1.0)
NEUTROS PCT: 59 %
Neutro Abs: 3.7 10*3/uL (ref 1.7–7.7)
PLATELETS: 248 10*3/uL (ref 150–400)
RBC: 3.85 MIL/uL — ABNORMAL LOW (ref 3.87–5.11)
RDW: 12.9 % (ref 11.5–15.5)
WBC: 6.3 10*3/uL (ref 4.0–10.5)

## 2017-01-23 LAB — URINALYSIS, ROUTINE W REFLEX MICROSCOPIC
Bilirubin Urine: NEGATIVE
Glucose, UA: NEGATIVE mg/dL
Hgb urine dipstick: NEGATIVE
Ketones, ur: NEGATIVE mg/dL
Leukocytes, UA: NEGATIVE
Nitrite: NEGATIVE
PH: 6 (ref 5.0–8.0)
Protein, ur: NEGATIVE mg/dL
SPECIFIC GRAVITY, URINE: 1.016 (ref 1.005–1.030)

## 2017-01-23 LAB — COMPREHENSIVE METABOLIC PANEL
ALBUMIN: 3.8 g/dL (ref 3.5–5.0)
ALT: 9 U/L — AB (ref 14–54)
AST: 13 U/L — AB (ref 15–41)
Alkaline Phosphatase: 57 U/L (ref 38–126)
Anion gap: 6 (ref 5–15)
BILIRUBIN TOTAL: 0.5 mg/dL (ref 0.3–1.2)
BUN: 21 mg/dL — AB (ref 6–20)
CHLORIDE: 108 mmol/L (ref 101–111)
CO2: 27 mmol/L (ref 22–32)
CREATININE: 1.22 mg/dL — AB (ref 0.44–1.00)
Calcium: 8.6 mg/dL — ABNORMAL LOW (ref 8.9–10.3)
GFR calc Af Amer: 48 mL/min — ABNORMAL LOW (ref >60)
GFR, EST NON AFRICAN AMERICAN: 41 mL/min — AB (ref >60)
GLUCOSE: 112 mg/dL — AB (ref 65–99)
POTASSIUM: 3.7 mmol/L (ref 3.5–5.1)
Sodium: 141 mmol/L (ref 135–145)
TOTAL PROTEIN: 6.6 g/dL (ref 6.5–8.1)

## 2017-01-23 LAB — I-STAT TROPONIN, ED: TROPONIN I, POC: 0 ng/mL (ref 0.00–0.08)

## 2017-01-23 MED ORDER — HYDRALAZINE HCL 25 MG PO TABS: 25.0000 mg | ORAL_TABLET | Freq: Two times a day (BID) | ORAL | 0 refills | Status: DC

## 2017-01-23 MED ORDER — SODIUM CHLORIDE 0.9 % IV BOLUS (SEPSIS)
500.0000 mL | Freq: Once | INTRAVENOUS | Status: AC
  Administered 2017-01-23: 500 mL via INTRAVENOUS

## 2017-01-23 MED ORDER — MECLIZINE HCL 25 MG PO TABS: 25.0000 mg | ORAL_TABLET | Freq: Three times a day (TID) | ORAL | 0 refills | Status: DC | PRN

## 2017-01-23 MED ORDER — HYDRALAZINE HCL 25 MG PO TABS
25.0000 mg | ORAL_TABLET | Freq: Once | ORAL | Status: AC
  Administered 2017-01-23: 25 mg via ORAL
  Filled 2017-01-23: qty 1

## 2017-01-23 MED ORDER — MECLIZINE HCL 25 MG PO TABS
25.0000 mg | ORAL_TABLET | Freq: Once | ORAL | Status: AC
  Administered 2017-01-23: 25 mg via ORAL
  Filled 2017-01-23: qty 1

## 2017-01-23 MED ORDER — HYDROCODONE-ACETAMINOPHEN 5-325 MG PO TABS
2.0000 | ORAL_TABLET | Freq: Once | ORAL | Status: AC
  Administered 2017-01-23: 2 via ORAL
  Filled 2017-01-23: qty 2

## 2017-01-23 NOTE — Discharge Instructions (Signed)
1. Continue taking her lisinopril 40 mg daily. 2. Start hydralazine 25 mg twice a day. 3. Make an appointment with your doctor for recheck within the next 3-4 days. 4. Take Antivert (meclizine) every 8 hours as needed for vertigo\dizziness. 5. Return to the emergency department if her symptoms are not improved, new concerning symptoms develop or other concerns.

## 2017-01-23 NOTE — ED Triage Notes (Signed)
Per EMS, pt is coming from home with complaints of dizziness. Pt has a hx of hypertension and takes lisinopril. Pt reports last taking the lisinopril at 1230 this afternoon. Pt denies pain at this time.
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
## 2017-01-23 NOTE — ED Notes (Signed)
Patient transported to MRI 

## 2017-01-23 NOTE — ED Notes (Signed)
IV placed and flushed, but unable to obtain enough blood return for sample collection. Primary RN made aware and main lab to be called.

## 2017-01-23 NOTE — ED Notes (Signed)
Bed: WA24 Expected date:  Expected time:  Means of arrival:  Comments: EMS-dizzy 

## 2017-01-23 NOTE — ED Notes (Signed)
2 unsuccessful attempts at peripheral iv.  

## 2017-01-23 NOTE — ED Provider Notes (Signed)
Presented to [facility] with vertiginous symptoms. Patient was treated with meclizine without significant relief. Patient was transferred to obtain MRI to rule out stroke with patient's age and CVA risk factors.  Patient arrived alert and appropriate. She had no confusion or mental status changes. Patient describes her dizziness as spinning in quality. No focal neurologic deficit. No visual deficit. She did report that itching quality and "digging" at her right ear sometimes.  We also extensively reviewed the patient's history of hypertension. She does report that her lisinopril dose had been increased to 40 mg and about the past 6 months. She reports that her blood pressures do at times run up to the 160s to 190s systolic. She reports her provider has advised she may need a second medication.  Physical examination patient was alert and appropriate. The time of her arrival she did admit experiencing improvement in the severity of the spinning quality.  On examination the patient is alert and oriented. No respiratory distress. Heart and lungs normal. Bile L TMs normal without erythema or bulging. Oral cavity widely patent. No focal neurologic deficit. Patient is able to stand and ambulate with her cane at baseline.  MRI does not show any acute CVA. Old cerebellar infarct noted. Microvascular ischemic disease less than expected for age noted.  By time of reassessment for discharge, patient's symptoms are significantly improved. She is now spontaneously ambulating with her cane. Is eating without nausea. I have added hydralazine to her anti-hyper-tension regimen. History, it sounds as though she is fairly frequently at least to the 150s to 190s range in systolic blood pressure. She is counseled on necessity to follow-up with her PCP for ongoing blood pressure monitoring and response to hydralazine. Patient is instructed to continue her lisinopril at 40 mg a day.   Arby Barrette, MD 01/23/17  2234

## 2017-01-23 NOTE — ED Notes (Signed)
Report taken from [facility]

## 2017-01-25 LAB — URINE CULTURE

## 2017-01-27 DIAGNOSIS — J019 Acute sinusitis, unspecified: Secondary | ICD-10-CM | POA: Diagnosis not present

## 2017-01-27 DIAGNOSIS — R42 Dizziness and giddiness: Secondary | ICD-10-CM | POA: Diagnosis not present

## 2017-01-27 DIAGNOSIS — E119 Type 2 diabetes mellitus without complications: Secondary | ICD-10-CM | POA: Diagnosis not present

## 2017-01-27 DIAGNOSIS — J449 Chronic obstructive pulmonary disease, unspecified: Secondary | ICD-10-CM | POA: Diagnosis not present

## 2017-01-27 DIAGNOSIS — M4316 Spondylolisthesis, lumbar region: Secondary | ICD-10-CM | POA: Diagnosis not present

## 2017-01-27 DIAGNOSIS — I10 Essential (primary) hypertension: Secondary | ICD-10-CM | POA: Diagnosis not present

## 2017-02-07 DIAGNOSIS — L700 Acne vulgaris: Secondary | ICD-10-CM | POA: Diagnosis not present

## 2017-02-07 DIAGNOSIS — E119 Type 2 diabetes mellitus without complications: Secondary | ICD-10-CM | POA: Diagnosis not present

## 2017-02-07 DIAGNOSIS — I10 Essential (primary) hypertension: Secondary | ICD-10-CM | POA: Diagnosis not present

## 2017-02-07 DIAGNOSIS — J449 Chronic obstructive pulmonary disease, unspecified: Secondary | ICD-10-CM | POA: Diagnosis not present

## 2017-02-07 DIAGNOSIS — M4307 Spondylolysis, lumbosacral region: Secondary | ICD-10-CM | POA: Diagnosis not present

## 2017-02-07 DIAGNOSIS — F17200 Nicotine dependence, unspecified, uncomplicated: Secondary | ICD-10-CM | POA: Diagnosis not present

## 2017-02-07 DIAGNOSIS — Z716 Tobacco abuse counseling: Secondary | ICD-10-CM | POA: Diagnosis not present

## 2017-05-01 DIAGNOSIS — M4316 Spondylolisthesis, lumbar region: Secondary | ICD-10-CM | POA: Diagnosis not present

## 2017-05-15 DIAGNOSIS — M47816 Spondylosis without myelopathy or radiculopathy, lumbar region: Secondary | ICD-10-CM | POA: Diagnosis not present

## 2017-05-15 DIAGNOSIS — M9983 Other biomechanical lesions of lumbar region: Secondary | ICD-10-CM | POA: Diagnosis not present

## 2017-05-15 DIAGNOSIS — M7061 Trochanteric bursitis, right hip: Secondary | ICD-10-CM | POA: Diagnosis not present

## 2017-05-15 DIAGNOSIS — M5416 Radiculopathy, lumbar region: Secondary | ICD-10-CM | POA: Diagnosis not present

## 2017-06-04 DIAGNOSIS — R030 Elevated blood-pressure reading, without diagnosis of hypertension: Secondary | ICD-10-CM | POA: Diagnosis not present

## 2017-06-04 DIAGNOSIS — M47816 Spondylosis without myelopathy or radiculopathy, lumbar region: Secondary | ICD-10-CM | POA: Diagnosis not present

## 2017-06-04 DIAGNOSIS — Z6832 Body mass index (BMI) 32.0-32.9, adult: Secondary | ICD-10-CM | POA: Diagnosis not present

## 2017-06-13 DIAGNOSIS — N281 Cyst of kidney, acquired: Secondary | ICD-10-CM | POA: Diagnosis not present

## 2017-06-13 DIAGNOSIS — R1084 Generalized abdominal pain: Secondary | ICD-10-CM | POA: Diagnosis not present

## 2017-07-07 DIAGNOSIS — M4316 Spondylolisthesis, lumbar region: Secondary | ICD-10-CM | POA: Diagnosis not present

## 2017-07-07 DIAGNOSIS — M9983 Other biomechanical lesions of lumbar region: Secondary | ICD-10-CM | POA: Diagnosis not present

## 2017-07-15 DIAGNOSIS — M545 Low back pain: Secondary | ICD-10-CM | POA: Diagnosis not present

## 2017-07-15 DIAGNOSIS — M4316 Spondylolisthesis, lumbar region: Secondary | ICD-10-CM | POA: Diagnosis not present

## 2017-07-28 DIAGNOSIS — Z6832 Body mass index (BMI) 32.0-32.9, adult: Secondary | ICD-10-CM | POA: Diagnosis not present

## 2017-07-28 DIAGNOSIS — M7061 Trochanteric bursitis, right hip: Secondary | ICD-10-CM | POA: Diagnosis not present

## 2017-07-28 DIAGNOSIS — M4316 Spondylolisthesis, lumbar region: Secondary | ICD-10-CM | POA: Diagnosis not present

## 2017-07-28 DIAGNOSIS — M9983 Other biomechanical lesions of lumbar region: Secondary | ICD-10-CM | POA: Diagnosis not present

## 2017-07-29 ENCOUNTER — Other Ambulatory Visit: Payer: Self-pay | Admitting: Neurosurgery

## 2017-07-29 DIAGNOSIS — M4316 Spondylolisthesis, lumbar region: Secondary | ICD-10-CM

## 2017-07-29 DIAGNOSIS — M7061 Trochanteric bursitis, right hip: Secondary | ICD-10-CM

## 2017-09-25 ENCOUNTER — Other Ambulatory Visit: Payer: PPO

## 2017-09-30 ENCOUNTER — Ambulatory Visit
Admission: RE | Admit: 2017-09-30 | Discharge: 2017-09-30 | Disposition: A | Discharge status: Home / Self Care | Payer: PPO | Source: Ambulatory Visit | Attending: Neurosurgery | Admitting: Neurosurgery

## 2017-09-30 DIAGNOSIS — M7061 Trochanteric bursitis, right hip: Secondary | ICD-10-CM

## 2017-09-30 DIAGNOSIS — M25551 Pain in right hip: Secondary | ICD-10-CM | POA: Diagnosis not present

## 2017-09-30 MED ORDER — IOPAMIDOL (ISOVUE-M 200) INJECTION 41%
1.0000 mL | Freq: Once | INTRAMUSCULAR | Status: AC
  Administered 2017-09-30: 1 mL

## 2017-09-30 MED ORDER — METHYLPREDNISOLONE ACETATE 40 MG/ML INJ SUSP (RADIOLOG
120.0000 mg | Freq: Once | INTRAMUSCULAR | Status: AC
  Administered 2017-09-30: 120 mg via INTRA_ARTICULAR

## 2017-10-23 ENCOUNTER — Ambulatory Visit
Admission: RE | Admit: 2017-10-23 | Discharge: 2017-10-23 | Disposition: A | Discharge status: Home / Self Care | Payer: PPO | Source: Ambulatory Visit | Attending: Neurosurgery | Admitting: Neurosurgery

## 2017-10-23 DIAGNOSIS — M5116 Intervertebral disc disorders with radiculopathy, lumbar region: Secondary | ICD-10-CM | POA: Diagnosis not present

## 2017-10-23 DIAGNOSIS — M4316 Spondylolisthesis, lumbar region: Secondary | ICD-10-CM

## 2017-10-23 MED ORDER — IOPAMIDOL (ISOVUE-M 200) INJECTION 41%
1.0000 mL | Freq: Once | INTRAMUSCULAR | Status: AC
  Administered 2017-10-23: 1 mL via EPIDURAL

## 2017-10-23 MED ORDER — METHYLPREDNISOLONE ACETATE 40 MG/ML INJ SUSP (RADIOLOG
120.0000 mg | Freq: Once | INTRAMUSCULAR | Status: AC
  Administered 2017-10-23: 120 mg via EPIDURAL

## 2017-10-23 NOTE — Discharge Instructions (Signed)

## 2017-11-13 ENCOUNTER — Other Ambulatory Visit: Payer: Self-pay | Admitting: Neurosurgery

## 2017-11-13 DIAGNOSIS — M7061 Trochanteric bursitis, right hip: Secondary | ICD-10-CM

## 2017-11-25 ENCOUNTER — Ambulatory Visit
Admission: RE | Admit: 2017-11-25 | Discharge: 2017-11-25 | Disposition: A | Discharge status: Home / Self Care | Payer: PPO | Source: Ambulatory Visit | Attending: Neurosurgery | Admitting: Neurosurgery

## 2017-11-25 DIAGNOSIS — M7061 Trochanteric bursitis, right hip: Secondary | ICD-10-CM

## 2017-11-25 MED ORDER — METHYLPREDNISOLONE ACETATE 40 MG/ML INJ SUSP (RADIOLOG: 120.0000 mg | Freq: Once | INTRAMUSCULAR | Status: DC

## 2017-11-25 MED ORDER — IOPAMIDOL (ISOVUE-M 200) INJECTION 41%: 1.0000 mL | Freq: Once | INTRAMUSCULAR | Status: DC

## 2018-01-09 ENCOUNTER — Other Ambulatory Visit: Payer: Self-pay | Admitting: *Deleted
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
## 2018-01-15 ENCOUNTER — Other Ambulatory Visit: Payer: Self-pay

## 2018-01-15 NOTE — Patient Outreach (Signed)
[facility]) Care Management  01/15/2018  Jean KetJohnnie M Aguilar 08/24/1935 [id number]   Initial outreach to patient in response to social work referral for assistance with Advance Directives, transportation, personal care services, financial resources.  Advance Directives:  BSW talked with Mr. Jean Aguilar about Healthcare POA and Living Will.  BSW mailed EMMI and Advance Directive packet.  Transportation:  Jean Aguilar reported that a neighbor typically gives her rides but she has to pay approximately $20 each ride.  BSW talked with her about [employer] and offered to submit an application but she declined.  She has already been approved for [employer] through [facility] but acknowledged that she often forgets that they require a two week notice.  BSW informed her about [employer] and agreed to get her added to their wait list.  BSW called and had her added. BSW mailed information about this program to her.  Personal care services:  Jean Aguilar said that she needs assistance with house work such as vacuuming etc and inquired about whether insurance would cover this type of service.  BSW informed her that private pay is the only option for this.   Financial resources:  Jean Aguilar said that she previously applied for Medicaid but was denied and remains over the income limit.  She declined additional financial resources.   BSW will follow up with her next week to ensure receipt of [employer] information and [employer]vance Directives.  BSW will further review Advance Directives with her at that time and answer any questions she may have.   Jean Aguilar, BSW Social Worker [phone]

## 2018-01-23 ENCOUNTER — Other Ambulatory Visit: Payer: Self-pay

## 2018-01-23 NOTE — Patient Outreach (Signed)
[facility]) Care Management  01/23/2018  Jean StabsJohnnie M Aguilar 12/23/1935 [id number]  Follow up call to [name] to ensure receipt of Advance Directives and to review with her.  [name] confirmed that she received the packet yesterday.  She requested to have some more time to review it.  BSW will follow up with her again next week.  Malachy ChamberAmber Neale Marzette, BSW Social Worker [phone]

## 2018-01-27 ENCOUNTER — Other Ambulatory Visit: Payer: Self-pay

## 2018-01-27 NOTE — Patient Outreach (Signed)
[facility]) Care Management  01/27/2018  Ricky StabsJohnnie M Fly 12/12/1935 [id number]   Follow up call to Ms. Nolte to review Advance Directives.  Ms. Unice BaileyFacuette asked that BSW call her another time because she was not feeling well.  BSW will contact her again next week.  Malachy ChamberAmber Wash Nienhaus, BSW Social Worker [phone]

## 2018-02-03 ENCOUNTER — Other Ambulatory Visit: Payer: Self-pay

## 2018-02-03 NOTE — Patient Outreach (Signed)
[facility] Care Management  02/03/2018  Jean Aguilar 05-Jun-1935 [id number]   Follow up call to [name] to review Advance Directives.  [name] denied having any questions about this documentation.   [name] inquired about resources for in-home assistance with light housekeeping/chores.  BSW reminded her that these services would have to be paid for out of pocket.  BSW offered to mail her a list of agencies but she reported that she is not able to afford to pay for these services.   BSW is closing case at this time due to no other social work needs being identified.    Malachy Chamber, BSW Social Worker [phone]

## 2018-06-17 DIAGNOSIS — E119 Type 2 diabetes mellitus without complications: Secondary | ICD-10-CM | POA: Diagnosis not present

## 2018-06-17 DIAGNOSIS — M4307 Spondylolysis, lumbosacral region: Secondary | ICD-10-CM | POA: Diagnosis not present

## 2018-06-17 DIAGNOSIS — N3281 Overactive bladder: Secondary | ICD-10-CM | POA: Diagnosis not present

## 2018-06-17 DIAGNOSIS — Z0000 Encounter for general adult medical examination without abnormal findings: Secondary | ICD-10-CM | POA: Diagnosis not present

## 2018-06-17 DIAGNOSIS — I10 Essential (primary) hypertension: Secondary | ICD-10-CM | POA: Diagnosis not present

## 2018-06-17 DIAGNOSIS — Z23 Encounter for immunization: Secondary | ICD-10-CM | POA: Diagnosis not present

## 2018-06-17 DIAGNOSIS — J209 Acute bronchitis, unspecified: Secondary | ICD-10-CM | POA: Diagnosis not present

## 2018-06-17 DIAGNOSIS — J449 Chronic obstructive pulmonary disease, unspecified: Secondary | ICD-10-CM | POA: Diagnosis not present

## 2018-11-13 DIAGNOSIS — J449 Chronic obstructive pulmonary disease, unspecified: Secondary | ICD-10-CM | POA: Diagnosis not present

## 2018-11-13 DIAGNOSIS — M4307 Spondylolysis, lumbosacral region: Secondary | ICD-10-CM | POA: Diagnosis not present

## 2018-11-13 DIAGNOSIS — I10 Essential (primary) hypertension: Secondary | ICD-10-CM | POA: Diagnosis not present

## 2018-11-13 DIAGNOSIS — E119 Type 2 diabetes mellitus without complications: Secondary | ICD-10-CM | POA: Diagnosis not present

## 2018-12-11 DIAGNOSIS — E119 Type 2 diabetes mellitus without complications: Secondary | ICD-10-CM | POA: Diagnosis not present

## 2018-12-11 DIAGNOSIS — I10 Essential (primary) hypertension: Secondary | ICD-10-CM | POA: Diagnosis not present

## 2018-12-11 DIAGNOSIS — M609 Myositis, unspecified: Secondary | ICD-10-CM | POA: Diagnosis not present

## 2018-12-11 DIAGNOSIS — J449 Chronic obstructive pulmonary disease, unspecified: Secondary | ICD-10-CM | POA: Diagnosis not present

## 2018-12-11 DIAGNOSIS — M179 Osteoarthritis of knee, unspecified: Secondary | ICD-10-CM | POA: Diagnosis not present

## 2019-03-05 DIAGNOSIS — Z23 Encounter for immunization: Secondary | ICD-10-CM | POA: Diagnosis not present

## 2019-03-05 DIAGNOSIS — M25559 Pain in unspecified hip: Secondary | ICD-10-CM | POA: Diagnosis not present

## 2019-03-05 DIAGNOSIS — E119 Type 2 diabetes mellitus without complications: Secondary | ICD-10-CM | POA: Diagnosis not present

## 2019-03-05 DIAGNOSIS — M25529 Pain in unspecified elbow: Secondary | ICD-10-CM | POA: Diagnosis not present

## 2019-03-05 DIAGNOSIS — I10 Essential (primary) hypertension: Secondary | ICD-10-CM | POA: Diagnosis not present

## 2019-05-05 DIAGNOSIS — E119 Type 2 diabetes mellitus without complications: Secondary | ICD-10-CM | POA: Diagnosis not present

## 2019-05-05 DIAGNOSIS — Z716 Tobacco abuse counseling: Secondary | ICD-10-CM | POA: Diagnosis not present

## 2019-05-05 DIAGNOSIS — F17210 Nicotine dependence, cigarettes, uncomplicated: Secondary | ICD-10-CM | POA: Diagnosis not present

## 2019-05-05 DIAGNOSIS — H00019 Hordeolum externum unspecified eye, unspecified eyelid: Secondary | ICD-10-CM | POA: Diagnosis not present

## 2019-05-05 DIAGNOSIS — I10 Essential (primary) hypertension: Secondary | ICD-10-CM | POA: Diagnosis not present

## 2019-05-05 DIAGNOSIS — J449 Chronic obstructive pulmonary disease, unspecified: Secondary | ICD-10-CM | POA: Diagnosis not present

## 2019-05-05 DIAGNOSIS — M609 Myositis, unspecified: Secondary | ICD-10-CM | POA: Diagnosis not present

## 2019-06-22 DIAGNOSIS — M4307 Spondylolysis, lumbosacral region: Secondary | ICD-10-CM | POA: Diagnosis not present

## 2019-06-22 DIAGNOSIS — J449 Chronic obstructive pulmonary disease, unspecified: Secondary | ICD-10-CM | POA: Diagnosis not present

## 2019-06-22 DIAGNOSIS — M179 Osteoarthritis of knee, unspecified: Secondary | ICD-10-CM | POA: Diagnosis not present

## 2019-06-22 DIAGNOSIS — I10 Essential (primary) hypertension: Secondary | ICD-10-CM | POA: Diagnosis not present

## 2019-08-09 DIAGNOSIS — Z0000 Encounter for general adult medical examination without abnormal findings: Secondary | ICD-10-CM | POA: Diagnosis not present

## 2019-08-09 DIAGNOSIS — M609 Myositis, unspecified: Secondary | ICD-10-CM | POA: Diagnosis not present

## 2019-08-09 DIAGNOSIS — I10 Essential (primary) hypertension: Secondary | ICD-10-CM | POA: Diagnosis not present

## 2019-08-09 DIAGNOSIS — J449 Chronic obstructive pulmonary disease, unspecified: Secondary | ICD-10-CM | POA: Diagnosis not present

## 2019-08-09 DIAGNOSIS — E119 Type 2 diabetes mellitus without complications: Secondary | ICD-10-CM | POA: Diagnosis not present

## 2019-08-09 DIAGNOSIS — E7849 Other hyperlipidemia: Secondary | ICD-10-CM | POA: Diagnosis not present

## 2019-08-13 DIAGNOSIS — I10 Essential (primary) hypertension: Secondary | ICD-10-CM | POA: Diagnosis not present

## 2019-08-13 DIAGNOSIS — M4307 Spondylolysis, lumbosacral region: Secondary | ICD-10-CM | POA: Diagnosis not present

## 2019-08-13 DIAGNOSIS — J449 Chronic obstructive pulmonary disease, unspecified: Secondary | ICD-10-CM | POA: Diagnosis not present

## 2019-08-13 DIAGNOSIS — E119 Type 2 diabetes mellitus without complications: Secondary | ICD-10-CM | POA: Diagnosis not present

## 2019-09-27 DIAGNOSIS — M4307 Spondylolysis, lumbosacral region: Secondary | ICD-10-CM | POA: Diagnosis not present

## 2019-09-27 DIAGNOSIS — J449 Chronic obstructive pulmonary disease, unspecified: Secondary | ICD-10-CM | POA: Diagnosis not present

## 2019-09-27 DIAGNOSIS — E119 Type 2 diabetes mellitus without complications: Secondary | ICD-10-CM | POA: Diagnosis not present

## 2019-09-27 DIAGNOSIS — M179 Osteoarthritis of knee, unspecified: Secondary | ICD-10-CM | POA: Diagnosis not present

## 2019-09-27 DIAGNOSIS — I10 Essential (primary) hypertension: Secondary | ICD-10-CM | POA: Diagnosis not present

## 2019-11-15 DIAGNOSIS — E119 Type 2 diabetes mellitus without complications: Secondary | ICD-10-CM | POA: Diagnosis not present

## 2019-11-15 DIAGNOSIS — J449 Chronic obstructive pulmonary disease, unspecified: Secondary | ICD-10-CM | POA: Diagnosis not present

## 2019-11-15 DIAGNOSIS — I10 Essential (primary) hypertension: Secondary | ICD-10-CM | POA: Diagnosis not present

## 2019-11-15 DIAGNOSIS — M4316 Spondylolisthesis, lumbar region: Secondary | ICD-10-CM | POA: Diagnosis not present

## 2019-11-29 DIAGNOSIS — I10 Essential (primary) hypertension: Secondary | ICD-10-CM | POA: Diagnosis not present

## 2019-11-29 DIAGNOSIS — G894 Chronic pain syndrome: Secondary | ICD-10-CM | POA: Diagnosis not present

## 2019-11-29 DIAGNOSIS — J449 Chronic obstructive pulmonary disease, unspecified: Secondary | ICD-10-CM | POA: Diagnosis not present

## 2019-11-29 DIAGNOSIS — E119 Type 2 diabetes mellitus without complications: Secondary | ICD-10-CM | POA: Diagnosis not present

## 2019-12-20 DIAGNOSIS — M79609 Pain in unspecified limb: Secondary | ICD-10-CM | POA: Diagnosis not present

## 2019-12-20 DIAGNOSIS — Z79891 Long term (current) use of opiate analgesic: Secondary | ICD-10-CM | POA: Diagnosis not present

## 2019-12-20 DIAGNOSIS — I119 Hypertensive heart disease without heart failure: Secondary | ICD-10-CM | POA: Diagnosis not present

## 2019-12-20 DIAGNOSIS — R42 Dizziness and giddiness: Secondary | ICD-10-CM | POA: Diagnosis not present

## 2019-12-20 DIAGNOSIS — M5030 Other cervical disc degeneration, unspecified cervical region: Secondary | ICD-10-CM | POA: Diagnosis not present

## 2019-12-20 DIAGNOSIS — G894 Chronic pain syndrome: Secondary | ICD-10-CM | POA: Diagnosis not present

## 2019-12-20 DIAGNOSIS — I6529 Occlusion and stenosis of unspecified carotid artery: Secondary | ICD-10-CM | POA: Diagnosis not present

## 2019-12-20 DIAGNOSIS — M5136 Other intervertebral disc degeneration, lumbar region: Secondary | ICD-10-CM | POA: Diagnosis not present

## 2019-12-20 DIAGNOSIS — E1140 Type 2 diabetes mellitus with diabetic neuropathy, unspecified: Secondary | ICD-10-CM | POA: Diagnosis not present

## 2019-12-20 DIAGNOSIS — J449 Chronic obstructive pulmonary disease, unspecified: Secondary | ICD-10-CM | POA: Diagnosis not present

## 2019-12-20 DIAGNOSIS — M25559 Pain in unspecified hip: Secondary | ICD-10-CM | POA: Diagnosis not present

## 2019-12-20 DIAGNOSIS — M48062 Spinal stenosis, lumbar region with neurogenic claudication: Secondary | ICD-10-CM | POA: Diagnosis not present

## 2019-12-20 DIAGNOSIS — R5382 Chronic fatigue, unspecified: Secondary | ICD-10-CM | POA: Diagnosis not present

## 2019-12-20 DIAGNOSIS — M47897 Other spondylosis, lumbosacral region: Secondary | ICD-10-CM | POA: Diagnosis not present

## 2019-12-20 DIAGNOSIS — M9951 Intervertebral disc stenosis of neural canal of cervical region: Secondary | ICD-10-CM | POA: Diagnosis not present

## 2019-12-24 DIAGNOSIS — I10 Essential (primary) hypertension: Secondary | ICD-10-CM | POA: Diagnosis not present

## 2019-12-24 DIAGNOSIS — M4307 Spondylolysis, lumbosacral region: Secondary | ICD-10-CM | POA: Diagnosis not present

## 2019-12-24 DIAGNOSIS — E119 Type 2 diabetes mellitus without complications: Secondary | ICD-10-CM | POA: Diagnosis not present

## 2019-12-24 DIAGNOSIS — J449 Chronic obstructive pulmonary disease, unspecified: Secondary | ICD-10-CM | POA: Diagnosis not present

## 2020-01-05 DIAGNOSIS — M5136 Other intervertebral disc degeneration, lumbar region: Secondary | ICD-10-CM | POA: Diagnosis not present

## 2020-01-05 DIAGNOSIS — G894 Chronic pain syndrome: Secondary | ICD-10-CM | POA: Diagnosis not present

## 2020-01-26 DIAGNOSIS — G894 Chronic pain syndrome: Secondary | ICD-10-CM | POA: Diagnosis not present

## 2020-01-31 DIAGNOSIS — G894 Chronic pain syndrome: Secondary | ICD-10-CM | POA: Diagnosis not present

## 2020-02-14 DIAGNOSIS — E119 Type 2 diabetes mellitus without complications: Secondary | ICD-10-CM | POA: Diagnosis not present

## 2020-02-14 DIAGNOSIS — M179 Osteoarthritis of knee, unspecified: Secondary | ICD-10-CM | POA: Diagnosis not present

## 2020-02-14 DIAGNOSIS — L700 Acne vulgaris: Secondary | ICD-10-CM | POA: Diagnosis not present

## 2020-02-14 DIAGNOSIS — I10 Essential (primary) hypertension: Secondary | ICD-10-CM | POA: Diagnosis not present

## 2020-02-14 DIAGNOSIS — Z23 Encounter for immunization: Secondary | ICD-10-CM | POA: Diagnosis not present

## 2020-02-14 DIAGNOSIS — J449 Chronic obstructive pulmonary disease, unspecified: Secondary | ICD-10-CM | POA: Diagnosis not present

## 2020-02-28 DIAGNOSIS — G894 Chronic pain syndrome: Secondary | ICD-10-CM | POA: Diagnosis not present

## 2020-02-28 DIAGNOSIS — Z79891 Long term (current) use of opiate analgesic: Secondary | ICD-10-CM | POA: Diagnosis not present

## 2020-02-28 DIAGNOSIS — M5030 Other cervical disc degeneration, unspecified cervical region: Secondary | ICD-10-CM | POA: Diagnosis not present

## 2020-02-28 DIAGNOSIS — M5136 Other intervertebral disc degeneration, lumbar region: Secondary | ICD-10-CM | POA: Diagnosis not present

## 2020-03-27 DIAGNOSIS — Z79891 Long term (current) use of opiate analgesic: Secondary | ICD-10-CM | POA: Diagnosis not present

## 2020-03-27 DIAGNOSIS — M5136 Other intervertebral disc degeneration, lumbar region: Secondary | ICD-10-CM | POA: Diagnosis not present

## 2020-03-27 DIAGNOSIS — M5030 Other cervical disc degeneration, unspecified cervical region: Secondary | ICD-10-CM | POA: Diagnosis not present

## 2020-03-27 DIAGNOSIS — G894 Chronic pain syndrome: Secondary | ICD-10-CM | POA: Diagnosis not present

## 2020-04-10 DIAGNOSIS — J449 Chronic obstructive pulmonary disease, unspecified: Secondary | ICD-10-CM | POA: Diagnosis not present

## 2020-04-10 DIAGNOSIS — I10 Essential (primary) hypertension: Secondary | ICD-10-CM | POA: Diagnosis not present

## 2020-04-10 DIAGNOSIS — M4316 Spondylolisthesis, lumbar region: Secondary | ICD-10-CM | POA: Diagnosis not present

## 2020-04-10 DIAGNOSIS — E119 Type 2 diabetes mellitus without complications: Secondary | ICD-10-CM | POA: Diagnosis not present

## 2020-04-10 DIAGNOSIS — G894 Chronic pain syndrome: Secondary | ICD-10-CM | POA: Diagnosis not present

## 2020-04-24 DIAGNOSIS — Z79891 Long term (current) use of opiate analgesic: Secondary | ICD-10-CM | POA: Diagnosis not present

## 2020-04-24 DIAGNOSIS — M5136 Other intervertebral disc degeneration, lumbar region: Secondary | ICD-10-CM | POA: Diagnosis not present

## 2020-04-24 DIAGNOSIS — M5030 Other cervical disc degeneration, unspecified cervical region: Secondary | ICD-10-CM | POA: Diagnosis not present

## 2020-04-24 DIAGNOSIS — G894 Chronic pain syndrome: Secondary | ICD-10-CM | POA: Diagnosis not present

## 2020-05-04 DIAGNOSIS — H04123 Dry eye syndrome of bilateral lacrimal glands: Secondary | ICD-10-CM | POA: Diagnosis not present

## 2020-05-04 DIAGNOSIS — Z961 Presence of intraocular lens: Secondary | ICD-10-CM | POA: Diagnosis not present

## 2020-05-04 DIAGNOSIS — H43811 Vitreous degeneration, right eye: Secondary | ICD-10-CM | POA: Diagnosis not present

## 2020-05-22 DIAGNOSIS — M5136 Other intervertebral disc degeneration, lumbar region: Secondary | ICD-10-CM | POA: Diagnosis not present

## 2020-05-22 DIAGNOSIS — Z79891 Long term (current) use of opiate analgesic: Secondary | ICD-10-CM | POA: Diagnosis not present

## 2020-05-22 DIAGNOSIS — M5030 Other cervical disc degeneration, unspecified cervical region: Secondary | ICD-10-CM | POA: Diagnosis not present

## 2020-05-22 DIAGNOSIS — G894 Chronic pain syndrome: Secondary | ICD-10-CM | POA: Diagnosis not present

## 2020-06-20 DIAGNOSIS — Z79891 Long term (current) use of opiate analgesic: Secondary | ICD-10-CM | POA: Diagnosis not present

## 2020-06-20 DIAGNOSIS — M5030 Other cervical disc degeneration, unspecified cervical region: Secondary | ICD-10-CM | POA: Diagnosis not present

## 2020-06-20 DIAGNOSIS — G894 Chronic pain syndrome: Secondary | ICD-10-CM | POA: Diagnosis not present

## 2020-06-20 DIAGNOSIS — M5136 Other intervertebral disc degeneration, lumbar region: Secondary | ICD-10-CM | POA: Diagnosis not present

## 2020-07-17 DIAGNOSIS — M792 Neuralgia and neuritis, unspecified: Secondary | ICD-10-CM | POA: Diagnosis not present

## 2020-07-17 DIAGNOSIS — I7389 Other specified peripheral vascular diseases: Secondary | ICD-10-CM | POA: Diagnosis not present

## 2020-07-17 DIAGNOSIS — M4807 Spinal stenosis, lumbosacral region: Secondary | ICD-10-CM | POA: Diagnosis not present

## 2020-07-17 DIAGNOSIS — G8929 Other chronic pain: Secondary | ICD-10-CM | POA: Diagnosis not present

## 2020-07-17 DIAGNOSIS — M9931 Osseous stenosis of neural canal of cervical region: Secondary | ICD-10-CM | POA: Diagnosis not present

## 2020-07-17 DIAGNOSIS — G894 Chronic pain syndrome: Secondary | ICD-10-CM | POA: Diagnosis not present

## 2020-07-17 DIAGNOSIS — M48062 Spinal stenosis, lumbar region with neurogenic claudication: Secondary | ICD-10-CM | POA: Diagnosis not present

## 2020-07-17 DIAGNOSIS — M47897 Other spondylosis, lumbosacral region: Secondary | ICD-10-CM | POA: Diagnosis not present

## 2020-07-17 DIAGNOSIS — M4726 Other spondylosis with radiculopathy, lumbar region: Secondary | ICD-10-CM | POA: Diagnosis not present

## 2020-07-17 DIAGNOSIS — M9951 Intervertebral disc stenosis of neural canal of cervical region: Secondary | ICD-10-CM | POA: Diagnosis not present

## 2020-07-17 DIAGNOSIS — R5382 Chronic fatigue, unspecified: Secondary | ICD-10-CM | POA: Diagnosis not present

## 2020-07-17 DIAGNOSIS — Z8249 Family history of ischemic heart disease and other diseases of the circulatory system: Secondary | ICD-10-CM | POA: Diagnosis not present

## 2020-07-17 DIAGNOSIS — M79609 Pain in unspecified limb: Secondary | ICD-10-CM | POA: Diagnosis not present

## 2020-08-14 DIAGNOSIS — G894 Chronic pain syndrome: Secondary | ICD-10-CM | POA: Diagnosis not present

## 2020-08-31 DIAGNOSIS — E119 Type 2 diabetes mellitus without complications: Secondary | ICD-10-CM | POA: Diagnosis not present

## 2020-08-31 DIAGNOSIS — J4520 Mild intermittent asthma, uncomplicated: Secondary | ICD-10-CM | POA: Diagnosis not present

## 2020-08-31 DIAGNOSIS — G894 Chronic pain syndrome: Secondary | ICD-10-CM | POA: Diagnosis not present

## 2020-08-31 DIAGNOSIS — I10 Essential (primary) hypertension: Secondary | ICD-10-CM | POA: Diagnosis not present

## 2020-08-31 DIAGNOSIS — E7849 Other hyperlipidemia: Secondary | ICD-10-CM | POA: Diagnosis not present

## 2020-09-11 DIAGNOSIS — G894 Chronic pain syndrome: Secondary | ICD-10-CM | POA: Diagnosis not present

## 2020-09-11 DIAGNOSIS — M545 Low back pain, unspecified: Secondary | ICD-10-CM | POA: Diagnosis not present

## 2020-10-02 DIAGNOSIS — G894 Chronic pain syndrome: Secondary | ICD-10-CM | POA: Diagnosis not present

## 2020-11-06 DIAGNOSIS — M9951 Intervertebral disc stenosis of neural canal of cervical region: Secondary | ICD-10-CM | POA: Diagnosis not present

## 2020-11-06 DIAGNOSIS — G8929 Other chronic pain: Secondary | ICD-10-CM | POA: Diagnosis not present

## 2020-11-06 DIAGNOSIS — M792 Neuralgia and neuritis, unspecified: Secondary | ICD-10-CM | POA: Diagnosis not present

## 2020-11-06 DIAGNOSIS — M1990 Unspecified osteoarthritis, unspecified site: Secondary | ICD-10-CM | POA: Diagnosis not present

## 2020-11-06 DIAGNOSIS — M48062 Spinal stenosis, lumbar region with neurogenic claudication: Secondary | ICD-10-CM | POA: Diagnosis not present

## 2020-11-06 DIAGNOSIS — M79609 Pain in unspecified limb: Secondary | ICD-10-CM | POA: Diagnosis not present

## 2020-11-06 DIAGNOSIS — R5382 Chronic fatigue, unspecified: Secondary | ICD-10-CM | POA: Diagnosis not present

## 2020-11-06 DIAGNOSIS — M25519 Pain in unspecified shoulder: Secondary | ICD-10-CM | POA: Diagnosis not present

## 2020-11-06 DIAGNOSIS — M47897 Other spondylosis, lumbosacral region: Secondary | ICD-10-CM | POA: Diagnosis not present

## 2020-11-06 DIAGNOSIS — M5030 Other cervical disc degeneration, unspecified cervical region: Secondary | ICD-10-CM | POA: Diagnosis not present

## 2020-11-06 DIAGNOSIS — R42 Dizziness and giddiness: Secondary | ICD-10-CM | POA: Diagnosis not present

## 2020-11-06 DIAGNOSIS — M5136 Other intervertebral disc degeneration, lumbar region: Secondary | ICD-10-CM | POA: Diagnosis not present

## 2020-11-06 DIAGNOSIS — G894 Chronic pain syndrome: Secondary | ICD-10-CM | POA: Diagnosis not present

## 2020-11-06 DIAGNOSIS — M25559 Pain in unspecified hip: Secondary | ICD-10-CM | POA: Diagnosis not present

## 2021-01-02 ENCOUNTER — Other Ambulatory Visit: Payer: Self-pay | Admitting: Internal Medicine

## 2021-01-02 DIAGNOSIS — M179 Osteoarthritis of knee, unspecified: Secondary | ICD-10-CM | POA: Diagnosis not present

## 2021-01-02 DIAGNOSIS — I10 Essential (primary) hypertension: Secondary | ICD-10-CM | POA: Diagnosis not present

## 2021-01-02 DIAGNOSIS — G894 Chronic pain syndrome: Secondary | ICD-10-CM | POA: Diagnosis not present

## 2021-01-02 DIAGNOSIS — J449 Chronic obstructive pulmonary disease, unspecified: Secondary | ICD-10-CM | POA: Diagnosis not present

## 2021-01-02 DIAGNOSIS — E7849 Other hyperlipidemia: Secondary | ICD-10-CM | POA: Diagnosis not present

## 2021-01-02 DIAGNOSIS — E119 Type 2 diabetes mellitus without complications: Secondary | ICD-10-CM | POA: Diagnosis not present

## 2021-01-02 DIAGNOSIS — Z0000 Encounter for general adult medical examination without abnormal findings: Secondary | ICD-10-CM | POA: Diagnosis not present

## 2021-01-03 LAB — CBC
HCT: 38.6 % (ref 35.0–45.0)
Hemoglobin: 12.4 g/dL (ref 11.7–15.5)
MCH: 29.7 pg (ref 27.0–33.0)
MCHC: 32.1 g/dL (ref 32.0–36.0)
MCV: 92.3 fL (ref 80.0–100.0)
MPV: 9.9 fL (ref 7.5–12.5)
Platelets: 273 10*3/uL (ref 140–400)
RBC: 4.18 10*6/uL (ref 3.80–5.10)
RDW: 12.8 % (ref 11.0–15.0)
WBC: 5.6 10*3/uL (ref 3.8–10.8)

## 2021-01-03 LAB — LIPID PANEL
Cholesterol: 274 mg/dL — ABNORMAL HIGH (ref <200)
HDL: 79 mg/dL (ref >=50)
LDL Cholesterol (Calc): 167 mg/dL (calc) — ABNORMAL HIGH
Non-HDL Cholesterol (Calc): 195 mg/dL (calc) — ABNORMAL HIGH (ref <130)
Total CHOL/HDL Ratio: 3.5 (calc) (ref <5.0)
Triglycerides: 139 mg/dL (ref <150)

## 2021-01-03 LAB — COMPLETE METABOLIC PANEL WITH GFR
AG Ratio: 1.5 (calc) (ref 1.0–2.5)
ALT: 7 U/L (ref 6–29)
AST: 11 U/L (ref 10–35)
Albumin: 4.1 g/dL (ref 3.6–5.1)
Alkaline phosphatase (APISO): 66 U/L (ref 37–153)
BUN/Creatinine Ratio: 9 (calc) (ref 6–22)
BUN: 12 mg/dL (ref 7–25)
CO2: 25 mmol/L (ref 20–32)
Calcium: 9 mg/dL (ref 8.6–10.4)
Chloride: 107 mmol/L (ref 98–110)
Creat: 1.39 mg/dL — ABNORMAL HIGH (ref 0.60–0.95)
Globulin: 2.7 g/dL (calc) (ref 1.9–3.7)
Glucose, Bld: 100 mg/dL — ABNORMAL HIGH (ref 65–99)
Potassium: 3.6 mmol/L (ref 3.5–5.3)
Sodium: 142 mmol/L (ref 135–146)
Total Bilirubin: 0.5 mg/dL (ref 0.2–1.2)
Total Protein: 6.8 g/dL (ref 6.1–8.1)
eGFR: 38 mL/min/{1.73_m2} — ABNORMAL LOW (ref >=60)

## 2021-01-03 LAB — TSH: TSH: 2.38 mIU/L (ref 0.40–4.50)

## 2021-02-06 DIAGNOSIS — G894 Chronic pain syndrome: Secondary | ICD-10-CM | POA: Diagnosis not present

## 2021-09-11 ENCOUNTER — Ambulatory Visit
Admission: RE | Admit: 2021-09-11 | Discharge: 2021-09-11 | Disposition: A | Discharge status: Home / Self Care | Payer: PPO | Source: Ambulatory Visit | Attending: Nurse Practitioner | Admitting: Nurse Practitioner

## 2021-09-11 ENCOUNTER — Other Ambulatory Visit: Payer: Self-pay | Admitting: Nurse Practitioner

## 2021-09-11 DIAGNOSIS — W19XXXA Unspecified fall, initial encounter: Secondary | ICD-10-CM

## 2021-09-13 ENCOUNTER — Other Ambulatory Visit: Payer: Self-pay | Admitting: Nurse Practitioner

## 2021-09-13 DIAGNOSIS — E2839 Other primary ovarian failure: Secondary | ICD-10-CM

## 2021-11-04 ENCOUNTER — Other Ambulatory Visit: Payer: Self-pay

## 2021-11-04 ENCOUNTER — Encounter (HOSPITAL_COMMUNITY): Payer: Self-pay | Admitting: Emergency Medicine

## 2021-11-04 ENCOUNTER — Ambulatory Visit (HOSPITAL_COMMUNITY)
Admission: EM | Admit: 2021-11-04 | Discharge: 2021-11-04 | Disposition: A | Discharge status: Home / Self Care | Payer: PPO | Attending: Family Medicine | Admitting: Family Medicine

## 2021-11-04 DIAGNOSIS — T7840XA Allergy, unspecified, initial encounter: Secondary | ICD-10-CM | POA: Diagnosis not present

## 2021-11-04 MED ORDER — TRIAMCINOLONE ACETONIDE 40 MG/ML IJ SUSP
40.0000 mg | Freq: Once | INTRAMUSCULAR | Status: AC
  Administered 2021-11-04: 40 mg via INTRAMUSCULAR

## 2021-11-04 MED ORDER — FAMOTIDINE 20 MG PO TABS: 20.0000 mg | ORAL_TABLET | Freq: Two times a day (BID) | ORAL | 0 refills | Status: DC

## 2021-11-04 MED ORDER — PREDNISONE 20 MG PO TABS: 40.0000 mg | ORAL_TABLET | Freq: Every day | ORAL | 0 refills | Status: AC

## 2021-11-04 MED ORDER — TRIAMCINOLONE ACETONIDE 40 MG/ML IJ SUSP
INTRAMUSCULAR | Status: AC
  Filled 2021-11-04: qty 1

## 2021-11-04 NOTE — ED Triage Notes (Signed)
This morning, patient had swelling of lips, lower lip is greatly swollen.  Patient does not appear to be in any distress.

## 2021-11-04 NOTE — Discharge Instructions (Addendum)
You have been given an injection of triamcinolone 40 mg today  Prednisone 20 mg--take 2 tablets daily for 5 days.  Please start this this evening  Take famotidine 20 mg--1 tablet 2 times daily  Please notify your primary care office that this is happened. You could delay taking your lisinopril tomorrow till after you talk to the primary care office.
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
## 2022-02-21 ENCOUNTER — Other Ambulatory Visit: Payer: Self-pay | Admitting: Nurse Practitioner

## 2022-02-21 DIAGNOSIS — Z1231 Encounter for screening mammogram for malignant neoplasm of breast: Secondary | ICD-10-CM

## 2022-03-07 ENCOUNTER — Inpatient Hospital Stay: Admission: RE | Admit: 2022-03-07 | Payer: PPO | Source: Ambulatory Visit

## 2022-08-27 ENCOUNTER — Inpatient Hospital Stay: Admission: RE | Admit: 2022-08-27 | Payer: PPO | Source: Ambulatory Visit

## 2022-12-31 ENCOUNTER — Other Ambulatory Visit: Payer: Self-pay | Admitting: Nurse Practitioner

## 2022-12-31 DIAGNOSIS — Z1231 Encounter for screening mammogram for malignant neoplasm of breast: Secondary | ICD-10-CM

## 2023-01-10 ENCOUNTER — Ambulatory Visit: Payer: Medicare Other

## 2023-05-29 ENCOUNTER — Other Ambulatory Visit: Payer: Self-pay

## 2023-05-29 DIAGNOSIS — E2839 Other primary ovarian failure: Secondary | ICD-10-CM

## 2024-05-01 ENCOUNTER — Inpatient Hospital Stay (HOSPITAL_COMMUNITY)
Admission: EM | Admit: 2024-05-01 | Discharge: 2024-05-05 | DRG: 682 | Disposition: A | Attending: Internal Medicine | Admitting: Internal Medicine

## 2024-05-01 ENCOUNTER — Other Ambulatory Visit: Payer: Self-pay

## 2024-05-01 ENCOUNTER — Encounter (HOSPITAL_COMMUNITY): Payer: Self-pay

## 2024-05-01 ENCOUNTER — Observation Stay (HOSPITAL_COMMUNITY)

## 2024-05-01 ENCOUNTER — Emergency Department (HOSPITAL_COMMUNITY)

## 2024-05-01 DIAGNOSIS — F0150 Vascular dementia without behavioral disturbance: Secondary | ICD-10-CM | POA: Diagnosis present

## 2024-05-01 DIAGNOSIS — E162 Hypoglycemia, unspecified: Secondary | ICD-10-CM | POA: Diagnosis present

## 2024-05-01 DIAGNOSIS — N179 Acute kidney failure, unspecified: Secondary | ICD-10-CM | POA: Diagnosis present

## 2024-05-01 DIAGNOSIS — I10 Essential (primary) hypertension: Secondary | ICD-10-CM | POA: Diagnosis present

## 2024-05-01 DIAGNOSIS — D649 Anemia, unspecified: Secondary | ICD-10-CM

## 2024-05-01 DIAGNOSIS — N189 Chronic kidney disease, unspecified: Secondary | ICD-10-CM | POA: Diagnosis present

## 2024-05-01 DIAGNOSIS — R4182 Altered mental status, unspecified: Secondary | ICD-10-CM | POA: Diagnosis present

## 2024-05-01 DIAGNOSIS — R410 Disorientation, unspecified: Principal | ICD-10-CM | POA: Diagnosis present

## 2024-05-01 LAB — CBC WITH DIFFERENTIAL/PLATELET
Abs Immature Granulocytes: 0.03 K/uL (ref 0.00–0.07)
Basophils Absolute: 0 K/uL (ref 0.0–0.1)
Basophils Relative: 1 %
Eosinophils Absolute: 0.1 K/uL (ref 0.0–0.5)
Eosinophils Relative: 1 %
HCT: 29.3 % — ABNORMAL LOW (ref 36.0–46.0)
Hemoglobin: 9 g/dL — ABNORMAL LOW (ref 12.0–15.0)
Immature Granulocytes: 0 %
Lymphocytes Relative: 15 %
Lymphs Abs: 1.3 K/uL (ref 0.7–4.0)
MCH: 29.9 pg (ref 26.0–34.0)
MCHC: 30.7 g/dL (ref 30.0–36.0)
MCV: 97.3 fL (ref 80.0–100.0)
Monocytes Absolute: 0.5 K/uL (ref 0.1–1.0)
Monocytes Relative: 5 %
Neutro Abs: 6.5 K/uL (ref 1.7–7.7)
Neutrophils Relative %: 78 %
Platelets: 243 K/uL (ref 150–400)
RBC: 3.01 MIL/uL — ABNORMAL LOW (ref 3.87–5.11)
RDW: 13.8 % (ref 11.5–15.5)
WBC: 8.4 K/uL (ref 4.0–10.5)
nRBC: 0 % (ref 0.0–0.2)

## 2024-05-01 LAB — COMPREHENSIVE METABOLIC PANEL WITH GFR
ALT: 10 U/L (ref 0–44)
AST: 17 U/L (ref 15–41)
Albumin: 4 g/dL (ref 3.5–5.0)
Alkaline Phosphatase: 75 U/L (ref 38–126)
Anion gap: 15 (ref 5–15)
BUN: 44 mg/dL — ABNORMAL HIGH (ref 8–23)
CO2: 21 mmol/L — ABNORMAL LOW (ref 22–32)
Calcium: 8 mg/dL — ABNORMAL LOW (ref 8.9–10.3)
Chloride: 105 mmol/L (ref 98–111)
Creatinine, Ser: 3.11 mg/dL — ABNORMAL HIGH (ref 0.44–1.00)
GFR, Estimated: 14 mL/min — ABNORMAL LOW (ref >60)
Glucose, Bld: 85 mg/dL (ref 70–99)
Potassium: 3.6 mmol/L (ref 3.5–5.1)
Sodium: 141 mmol/L (ref 135–145)
Total Bilirubin: 0.6 mg/dL (ref 0.0–1.2)
Total Protein: 6.7 g/dL (ref 6.5–8.1)

## 2024-05-01 LAB — URINALYSIS, W/ REFLEX TO CULTURE (INFECTION SUSPECTED)
Bacteria, UA: NONE SEEN
Bilirubin Urine: NEGATIVE
Glucose, UA: NEGATIVE mg/dL
Ketones, ur: NEGATIVE mg/dL
Leukocytes,Ua: NEGATIVE
Nitrite: NEGATIVE
Protein, ur: 100 mg/dL — AB
Specific Gravity, Urine: >1.03 — ABNORMAL HIGH (ref 1.005–1.030)
pH: 6 (ref 5.0–8.0)

## 2024-05-01 LAB — RAPID URINE DRUG SCREEN, HOSP PERFORMED
Amphetamines: NOT DETECTED
Barbiturates: NOT DETECTED
Benzodiazepines: NOT DETECTED
Cocaine: NOT DETECTED
Opiates: NOT DETECTED
Tetrahydrocannabinol: NOT DETECTED

## 2024-05-01 LAB — GLUCOSE, CAPILLARY
Glucose-Capillary: 74 mg/dL (ref 70–99)
Glucose-Capillary: 74 mg/dL (ref 70–99)

## 2024-05-01 LAB — ETHANOL: Alcohol, Ethyl (B): <15 mg/dL (ref <15)

## 2024-05-01 LAB — BRAIN NATRIURETIC PEPTIDE: B Natriuretic Peptide: 498.9 pg/mL — ABNORMAL HIGH (ref 0.0–100.0)

## 2024-05-01 LAB — CBG MONITORING, ED: Glucose-Capillary: 86 mg/dL (ref 70–99)

## 2024-05-01 MED ORDER — DEXTROSE 5 % IV SOLN: INTRAVENOUS | Status: AC

## 2024-05-01 MED ORDER — FAMOTIDINE 20 MG PO TABS: 20.0000 mg | ORAL_TABLET | Freq: Two times a day (BID) | ORAL | Status: DC

## 2024-05-01 MED ORDER — ALBUTEROL SULFATE (2.5 MG/3ML) 0.083% IN NEBU: 2.5000 mg | INHALATION_SOLUTION | Freq: Four times a day (QID) | RESPIRATORY_TRACT | Status: DC | PRN

## 2024-05-01 MED ORDER — ACETAMINOPHEN 650 MG RE SUPP: 650.0000 mg | Freq: Four times a day (QID) | RECTAL | Status: DC | PRN

## 2024-05-01 MED ORDER — HEPARIN SODIUM (PORCINE) 5000 UNIT/ML IJ SOLN
5000.0000 [IU] | Freq: Three times a day (TID) | INTRAMUSCULAR | Status: DC
  Administered 2024-05-01 – 2024-05-03 (×7): 5000 [IU] via SUBCUTANEOUS
  Filled 2024-05-01 (×8): qty 1

## 2024-05-01 MED ORDER — METHOCARBAMOL 1000 MG/10ML IJ SOLN
500.0000 mg | Freq: Once | INTRAMUSCULAR | Status: AC
  Administered 2024-05-01: 500 mg via INTRAVENOUS
  Filled 2024-05-01: qty 10

## 2024-05-01 MED ORDER — ACETAMINOPHEN 10 MG/ML IV SOLN
1000.0000 mg | Freq: Once | INTRAVENOUS | Status: AC
  Administered 2024-05-01: 1000 mg via INTRAVENOUS
  Filled 2024-05-01: qty 100

## 2024-05-01 MED ORDER — LIDOCAINE 5 % EX PTCH
1.0000 | MEDICATED_PATCH | CUTANEOUS | Status: DC
  Filled 2024-05-01: qty 1

## 2024-05-01 MED ORDER — DEXTROSE 10 % IV SOLN: INTRAVENOUS | Status: DC

## 2024-05-01 MED ORDER — LIDOCAINE 5 % EX PTCH
2.0000 | MEDICATED_PATCH | CUTANEOUS | Status: DC
  Administered 2024-05-01: 2 via TRANSDERMAL
  Filled 2024-05-01 (×2): qty 2

## 2024-05-01 MED ORDER — POLYETHYLENE GLYCOL 3350 17 G PO PACK
17.0000 g | PACK | Freq: Every day | ORAL | Status: DC
  Administered 2024-05-02 – 2024-05-05 (×3): 17 g via ORAL
  Filled 2024-05-01 (×4): qty 1

## 2024-05-01 MED ORDER — SENNA 8.6 MG PO TABS
1.0000 | ORAL_TABLET | Freq: Every day | ORAL | Status: DC
  Administered 2024-05-02 – 2024-05-05 (×4): 8.6 mg via ORAL
  Filled 2024-05-01 (×4): qty 1

## 2024-05-01 MED ORDER — CHLORHEXIDINE GLUCONATE CLOTH 2 % EX PADS
6.0000 | MEDICATED_PAD | Freq: Every day | CUTANEOUS | Status: DC
  Administered 2024-05-02 – 2024-05-05 (×4): 6 via TOPICAL

## 2024-05-01 MED ORDER — POLYETHYLENE GLYCOL 3350 17 G PO PACK: 17.0000 g | PACK | Freq: Every day | ORAL | Status: DC | PRN

## 2024-05-01 MED ORDER — HYDRALAZINE HCL 25 MG PO TABS
25.0000 mg | ORAL_TABLET | Freq: Two times a day (BID) | ORAL | Status: DC
  Administered 2024-05-02 – 2024-05-03 (×2): 25 mg via ORAL
  Filled 2024-05-01 (×2): qty 1

## 2024-05-01 MED ORDER — ASPIRIN 81 MG PO CHEW
81.0000 mg | CHEWABLE_TABLET | Freq: Every day | ORAL | Status: DC
  Administered 2024-05-03 – 2024-05-05 (×3): 81 mg via ORAL
  Filled 2024-05-01 (×3): qty 1

## 2024-05-01 MED ORDER — ACETAMINOPHEN 325 MG PO TABS: 650.0000 mg | ORAL_TABLET | Freq: Four times a day (QID) | ORAL | Status: DC | PRN

## 2024-05-01 MED ORDER — LACTATED RINGERS IV BOLUS
1000.0000 mL | Freq: Once | INTRAVENOUS | Status: AC
  Administered 2024-05-01: 1000 mL via INTRAVENOUS

## 2024-05-01 NOTE — H&P (Signed)
 Date: 05/01/2024               Patient Name:  Jean Aguilar MRN: [id number]  DOB: [birth year] Age / Sex: 88 y.o., female   PCP: [facility], [facility]         Medical Service: Internal Medicine Teaching Service         Attending Physician: Dr. Jone Dauphin      First Contact: Armando Rossetti, MD}    Second Contact: Dr. Missy Sandhoff, MD         Pager Information: First Contact Pager: [phone]   Second Contact Pager: [phone]   SUBJECTIVE   Chief Complaint: Altered Mental Status   History of Present Illness: [name] is a 88 y.o. female with PMH of of hypertension, CKD?, prior CVA, chronic back pain, and baseline cognitive impairment (on donepezil in the past) who presents with acute altered mental status and chronic lower-extremity swelling.  History is limited by the patient's altered mental status. Additional history obtained from her son and EMS.  The patient was reportedly at her normal cognitive baseline yesterday, able to communicate and recognize her son. This morning, neighbors heard her screaming and acting incoherently, prompting EMS activation. EMS reported waxing, waning confusion en route. Her son describes several days of intermittent confusion, but today's episode was more severe and unlike her baseline.  She lives alone and self-administers her medications despite longstanding memory impairment. She recently saw her PCP on 11/25, where several pain medications were restarted or adjusted. Her son found gabapentin and tizanidine at home; EMS reported additional tramadol  and a cholesterol medication. The son strongly suspects she is over-taking medication or not following prescribed timing/doses, and she becomes defensive and confused when he asks. She has chronic memory issues and was previously prescribed donepezil and memantine, though adherence is unclear.  She reports chronic bilateral lower-extremity swelling for months, unchanged. No recent fever, chills,  cough, URI symptoms, vomiting, or diarrhea. She has a history of intermittent urinary difficulty; she was unable to void in the ED and required straight cath (300 mL output).  Relevant Prior Neurologic History 2016 hospitalization for gait abnormality and expressive aphasia, suspected small stroke/TIA. MRI 2016: no acute stroke, but chronic microhemorrhage, chronic R cerebellar infarct, and moderate atherosclerotic disease (L ICA siphon stenosis, mild PCA/MCA disease). History of microvascular ischemic disease.  At baseline, she has amnestic MCI and intermittent difficulty with memory but is usually able to converse.  ED Course:  In the ED, she was diffusely tremulous, restless, oriented only to person and place, and unable to participate in full interview.  Labs showed Cr 3.1 (prior 1.4 2022, GFR 38), BUN 44, Hgb 9.0, concentrated urine without clear infection, and BNP 498. Imaging: CT head showed no acute abnormalities. Drug screen negative.  Meds:  Patient reported:   No outpatient medications have been marked as taking for the 05/01/24 encounter [facility] Encounter).    Past Medical History Essential hypertension CKD (baseline unclear) Prior CVA/TIA-like episode (2016) Chronic back pain COPD/Asthma Hyperlipidemia Type II diabetes mellitus (historical, unclear control) Cervical spondylosis Fibromyalgia Amnestic mild cognitive impairment GERD Hypothyroidism (historical) Carotid disease (moderate L ICA siphon stenosis in 2016)  Past Surgical History Past Surgical History:  Procedure Laterality Date   ABDOMINAL HYSTERECTOMY  ~ 1979   APPENDECTOMY  1950's   BREAST BIOPSY     bilaterally   CARDIAC CATHETERIZATION     CYSTOSCOPY W/ URETERAL STENT PLACEMENT  1970's?   CYSTOSCOPY W/ URETERAL STENT REMOVAL  1970's?  ELBOW SURGERY     bilateral; left for fluid; right to get rid of nerve/muscles fusing themselves together 02/20/2012   KNEE ARTHROSCOPY     BILATERAL     MASTECTOMY COMPLETE / SIMPLE     right; came back clean; simple   SHOULDER ARTHROSCOPY     RIGHT   TONSILLECTOMY  1950's   TOTAL KNEE ARTHROPLASTY  02/20/2012   left   TOTAL KNEE ARTHROPLASTY  02/20/2012   Procedure: TOTAL KNEE ARTHROPLASTY;  Surgeon: [place] JULIANNA Pepper, MD;  Location: [facility] OR;  Service: Orthopedics;  Laterality: Left;     Social:  Lives With:Alone Occupation:None Support: support from family (son) Level of Function: Independent, use cane and walker to ambulate  PCP:  [facility], [employer]  Substances: -Tobacco: Dnied -Alcohol: Denied -Recreational Drug: Denied  Family History:  Family History  Problem Relation Age of Onset   Alzheimer's disease Mother 38     Allergies: Allergies as of 05/01/2024 - Review Complete 05/01/2024  Allergen Reaction Noted   Cephalexin Rash 08/29/2011   Lactose intolerance (gi) Other (See Comments) 12/12/2014   Morphine and codeine  Other (See Comments) 08/29/2011   Penicillins Swelling and Other (See Comments) 08/29/2011   Tuberculin tests Other (See Comments) 02/20/2012    Review of Systems: A complete ROS was negative except as per HPI.   OBJECTIVE:   Physical Exam: Blood pressure (!) 178/64, pulse 74, temperature 99.4 F (37.4 C), temperature source Oral, resp. rate 19, height 5' 3 (1.6 m), weight 83.9 kg, SpO2 97%.  Constitutional: Well-appearing elderly woman, lying in bed, tremulous but in no acute distress. HENT: NCAT, mucous membranes moist. Eyes: Conjunctiva clear; PERRL; EOMI. Neck: Supple. Cardiovascular: RRR, no murmurs. Pulmonary: Normal work of breathing; lungs clear bilaterally. Abdominal: Soft, non-tender, non-distended. MSK: Normal bulk and tone; 1+ bilateral lower-extremity edema. Neurological: Alert to person; follows simple commands; no focal deficits; moves all extremities; tremulous. Skin: Warm and dry. Psych: Pleasant,  confused.  Labs: CBC    Component Value Date/Time   WBC 8.4 05/01/2024 1145    RBC 3.01 (L) 05/01/2024 1145   HGB 9.0 (L) 05/01/2024 1145   HCT 29.3 (L) 05/01/2024 1145   PLT 243 05/01/2024 1145   MCV 97.3 05/01/2024 1145   MCH 29.9 05/01/2024 1145   MCHC 30.7 05/01/2024 1145   RDW 13.8 05/01/2024 1145   LYMPHSABS 1.3 05/01/2024 1145   MONOABS 0.5 05/01/2024 1145   EOSABS 0.1 05/01/2024 1145   BASOSABS 0.0 05/01/2024 1145     CMP     Component Value Date/Time   NA 141 05/01/2024 1145   K 3.6 05/01/2024 1145   CL 105 05/01/2024 1145   CO2 21 (L) 05/01/2024 1145   GLUCOSE 85 05/01/2024 1145   BUN 44 (H) 05/01/2024 1145   CREATININE 3.11 (H) 05/01/2024 1145   CREATININE 1.39 (H) 01/02/2021 1505   CALCIUM  8.0 (L) 05/01/2024 1145   PROT 6.7 05/01/2024 1145   ALBUMIN 4.0 05/01/2024 1145   AST 17 05/01/2024 1145   ALT 10 05/01/2024 1145   ALKPHOS 75 05/01/2024 1145   BILITOT 0.6 05/01/2024 1145   GFRNONAA 14 (L) 05/01/2024 1145   GFRAA 48 (L) 01/23/2017 1621    Imaging: CT Head Wo Contrast Result Date: 05/01/2024 CLINICAL DATA:  Mental status change of unknown cause. EXAM: CT HEAD WITHOUT CONTRAST TECHNIQUE: Contiguous axial images were obtained from the base of the skull through the vertex without intravenous contrast. RADIATION DOSE REDUCTION: This exam was performed according to the  departmental dose-optimization program which includes automated exposure control, adjustment of the mA and/or kV according to patient size and/or use of iterative reconstruction technique. COMPARISON:  12/12/2014. FINDINGS: Brain: No evidence of acute infarction, hemorrhage, hydrocephalus, extra-axial collection or mass lesion/mass effect. There is age appropriate volume loss and mild periventricular white matter hypoattenuation consistent with chronic microvascular ischemic change. Vascular: No hyperdense vessel or unexpected calcification. Skull: Normal. Negative for fracture or focal lesion. Sinuses/Orbits: Globes and orbits are unremarkable. Visualized sinuses are clear.  Other: None. IMPRESSION: 1. No acute intracranial abnormalities. Electronically Signed   By: [name] Parkins M.D.   On: 05/01/2024 14:24     EKG: personally reviewed my interpretation is Sinus Rhythm with Mobitz type 1 .  ASSESSMENT & PLAN:   Assessment & Plan by Problem: Principal Problem:   AMS (altered mental status) Active Problems:   HTN (hypertension)   CKD (chronic kidney disease)  Gurleen M. Common is an 88 year old woman with a history of essential hypertension, CKD, prior CVA/TIA-like episodes (2016), chronic back pain on multiple sedating medications, chronic memory impairment (suspected vascular dementia/mild cognitive impairment), and polypharmacy, who presented with acute altered mental status, restlessness, and tremulousness, and is being admitted for acute encephalopathy likely from polypharmacy + AKI on hospital day 0.  #Acute Encephalopathy Likely multifactorial: polypharmacy (gabapentin, tizanidine, tramadol ), AKI impairing clearance, underlying cognitive impairment, possible dehydration, and urinary retention. CT head negative; no focal deficits. UA without clear infection. Tremulous and restless but following commands. Plan: - Hold gabapentin, tizanidine, tramadol  - Delirium precautions; frequent reorientation - Neuro checks q4h - Bladder scan, straight cath PRN - Check TSH, B12, - Avoid sedatives/anticholinergics - Consider EEG if mental status does not improve - PT/OT/CSW for safety and discharge planning  # AKI vs CKD Progression Cr 3.1; previous known Cr 1.4 (2016). Likely prerenal from poor PO intake vs progression of CKD; post-renal possible given retention. Plan: - Trend BMP - Renal ultrasound - Gentle IV fluids if needed - Avoid nephrotoxins - Hold lisinopril  and HCTZ - Monitor urine output/bladder scans  #Essential Hypertension Home regimen: hydralazine , losartan/HCTZ, amlodipine . Plan: - Continue hydralazine  - Hold losartan/HCTZ in setting of  AKI - Resume amlodipine  as needed once stable  #Hyperlipidemia - Continue On Crestor  20 mg.  #Chronic Back Pain On gabapentin, tizanidine, and recently tramadol ; all held due to encephalopathy. Plan: - Tylenol  PRN - Avoid sedating medications - PT evaluation  #Chronic Memory Impairment On memantine and Aricept per son's report. Plan: - Continue memantine - Hold donepezil until mental status improves  #Normocytic Anemia Hgb 9.0; new from prior. Plan: Iron studies, ferritin B12, folate Trend CBC  #Chronic Bilateral LE Edema Reported for months; BNP elevated but exam not volume overloaded. Plan: Leg elevation Compression as tolerated Avoid diuretics during AKI  #Safety / Disposition Concerns Lives alone; poor medication adherence; son concerned for cognitive decline. Plan: PT/OT/CSW evaluations Discuss need for supervised setting vs home health Medication reconciliation when stable  Best practice: Diet: NPO before clear bedside SLP VTE: Heparin  IVF: None,None Code: Full  Disposition planning: Prior to Admission Living Arrangement: Home, living alone Anticipated Discharge Location: Home  Dispo: Admit patient to Observation with expected length of stay less than 2 midnights.  Signed: Bernadine Manos, MD Internal Medicine Resident  05/01/2024, 3:52 PM  On Call pager: [phone]

## 2024-05-01 NOTE — Plan of Care (Addendum)
 Pt agitated & restless since onset of shift. Generalized myoclonic jerking of extremities noted intermittently but consistently. Pt rolling back and forth in bed, attempting to pull at lines (O2 & IV), and consistently attempting to get out of bed. Verbal interaction often does not make sense. Pt observed talking to someone not in the room, saying stop it or be quiet. Pt often laughing at odd times, laughter not provoked by conversation or television. When directly questioned pt can answer appropriately. Frequently yelling out it hurts, it hurts. When asked what hurts, she replied, my legs and hips. On-call MD notified for pain management orders. IV tylenol  administered per order & x2 lidocaine  patches placed to bilateral hips anteriorly. Pain unrelieved. On call MD notified of unrelieved pain and concern that uncontrolled pain is significantly contributing to pt's agitation and restlessness. IV Robaxin  ordered and administered. Since onset of shift pt frequently reporting she has the urge to urinate. Pt up to bedside commode with x1 assist. When on commode pt observed rocking back and forth and attempting to strain. Up to bedside commode 4 times between 19:00 and 21:30, unable to void. Bladder scan very difficult to obtain accurately due to pt constantly moving arms, torso, and hips. NT at bedside to help distract pt and minimize motion to obtain scan. 270cc bladder volume per scan. On-call MD notified of concern that volume of bladder is greater than volume shown via scan given pt's continuous movement and her reporting constant discomfort r/t urge to urinate sensation. Order for foley cathter placed. Placement of foley with 2 NT & 3 RN at bedside due to pt's lack of cooperation, constant movement, and resistance. Foley successfully placed per policy. 350cc UOP upon placement.   Bilateral hand mitts placed to prevent pt from pulling at oxygen  and IV lines. Pt constantly attempting to remove. Floor staff  remaining at bedside as much as possible for safety observation given pt is not redirectable via telesitter. Fall precautions in place, bed in low locked position, call bell within reach, non skid socks on, bed alarm activated.    00:30 Pt sitting up and back in bed repeatedly with hands shaking both lower siderails yelling that she is in pain. Any attempts by RN to assist pt with reassurance, repositioning, etc was met with pt's increased agitation and frustration. Pt yelling at RN to leave her alone. On-call MD notified-MD came to bedside to assess pt. One-time IV dilaudid  ordered for pain (administered per order), as well as right hip X-Ray. Soothing sleep sounds playing at bedside, shades closed, lights off. Pt remains constantly moving, picking at IV line and O2. Mitts off when staff at bedside since pt seems to be more agitated with mitts on trying to remove them. Pt remains alert and oriented to self only, occasionally to place. Continues to more often believe she is at home. Reoriented frequently.    01:10 Pt taken off floor to radiology for X-ray. Disconnected from PIV infusion. Pt in stable condition at time of transfer.  01:25 Pt returned from radiology. Pt in stable condition upon return. Floor NT at bedside for safety observation when possible. Soothing music played for pt to promote relaxation and sleep. Pain appears to be more controlled after dilaudid .   Problem: Education: Goal: Knowledge of General Education information will improve Description: Including pain rating scale, medication(s)/side effects and non-pharmacologic comfort measures Outcome: Progressing   Problem: Health Behavior/Discharge Planning: Goal: Ability to manage health-related needs will improve Outcome: Progressing   Problem: Clinical Measurements:  Goal: Ability to maintain clinical measurements within normal limits will improve Outcome: Progressing Goal: Will remain free from infection Outcome:  Progressing Goal: Diagnostic test results will improve Outcome: Progressing Goal: Respiratory complications will improve Outcome: Progressing Goal: Cardiovascular complication will be avoided Outcome: Progressing   Problem: Activity: Goal: Risk for activity intolerance will decrease Outcome: Progressing   Problem: Nutrition: Goal: Adequate nutrition will be maintained Outcome: Progressing   Problem: Coping: Goal: Level of anxiety will decrease Outcome: Progressing   Problem: Elimination: Goal: Will not experience complications related to bowel motility Outcome: Progressing Goal: Will not experience complications related to urinary retention Outcome: Progressing   Problem: Pain Managment: Goal: General experience of comfort will improve and/or be controlled Outcome: Progressing   Problem: Safety: Goal: Ability to remain free from injury will improve Outcome: Progressing   Problem: Skin Integrity: Goal: Risk for impaired skin integrity will decrease Outcome: Progressing

## 2024-05-01 NOTE — ED Provider Notes (Signed)
 [place] EMERGENCY DEPARTMENT AT [facility] Provider Note   CSN: [id number] Arrival date & time: 05/01/24  1113     Patient presents with: Leg Swelling   Jean Aguilar is a 88 y.o. female.   Patient is an 88 year old female with past medical history of hypertension, CKD, CVA, memory loss presenting to the emergency department with altered mental status and lower extremity swelling.  Patient is unable to supply any history due to her altered mental status.  I spoke with her son who reports that she saw her doctor on 11/25 and had some medication changes. When he went to her apartment yesterday, only saw gabapentin and tinazidine in her apartment. States EMS saw tramadol  and cholesterol medicine as well. She does her medications herself.  She is concerned that she is taking too much of her medication and states that she gets defensive whenever he asks her about how much she is taking or to see which medications she has.  States she was previously on donezepil so suspect mild dementia but unsure if she is still taking this. Patient has not wanted to move to assisted living so he's been unable to get her assistance with her medication.  He reports that the lower extremity swelling has been going on for months and is not a new problem today.  He states that her confusion has been coming and going for the last few days.  He states that they were concerned today that she has not been talking normally which is why they called 911.  Per EMS, these episodes are waxing and waning and route.  The history is provided by the EMS personnel and a relative. The history is limited by the condition of the patient (AMS).       Prior to Admission medications   Medication Sig Start Date End Date Taking? Authorizing Provider  aspirin  81 MG chewable tablet Chew 1 tablet (81 mg total) by mouth daily. Patient not taking: Reported on 01/23/2017 12/13/14   Regalado, Owen A, MD  famotidine  (PEPCID ) 20  MG tablet Take 1 tablet (20 mg total) by mouth 2 (two) times daily. 11/04/21   Vonna Sharlet POUR, MD  hydrALAZINE  (APRESOLINE ) 25 MG tablet Take 1 tablet (25 mg total) by mouth 2 (two) times daily. 01/23/17   Armenta Canning, MD  HYDROcodone -acetaminophen  (NORCO) 10-325 MG tablet take 1 tablet by mouth every 6 hours if needed for pain 01/16/17   [provider]  lisinopril  (PRINIVIL ,ZESTRIL ) 40 MG tablet Take 40 mg by mouth daily.    [provider]  meclizine  (ANTIVERT ) 25 MG tablet Take 1 tablet (25 mg total) by mouth 3 (three) times daily as needed for dizziness. 01/23/17   Armenta Canning, MD  oxybutynin  (DITROPAN -XL) 10 MG 24 hr tablet Take 10 mg by mouth at bedtime.    [provider]  PROAIR  HFA 108 (90 Base) MCG/ACT inhaler Inhale 2 puffs into the lungs every 6 (six) hours as needed.  12/24/16   [provider]  SYMBICORT  160-4.5 MCG/ACT inhaler Inhale 2 puffs into the lungs 2 (two) times daily.  12/24/16   [provider]    Allergies: Cephalexin, Lactose intolerance (gi), Morphine and codeine , Penicillins, and Tuberculin tests    Review of Systems  Updated Vital Signs BP (!) 153/129 (BP Location: Right Arm)   Pulse 74   Temp 99.3 F (37.4 C) (Oral)   Resp 20   Ht 5' 3 (1.6 m)   Wt 83.9 kg  SpO2 (!) 89%   BMI 32.77 kg/m   Physical Exam Vitals and nursing note reviewed.  Constitutional:      General: She is not in acute distress.    Appearance: Normal appearance.     Comments: Diffusely tremulous and restless in bed  HENT:     Head: Normocephalic and atraumatic.     Nose: Nose normal.     Mouth/Throat:     Mouth: Mucous membranes are moist.     Pharynx: Oropharynx is clear.  Eyes:     Extraocular Movements: Extraocular movements intact.     Conjunctiva/sclera: Conjunctivae normal.     Pupils: Pupils are equal, round, and reactive to light.  Cardiovascular:     Rate and Rhythm: Normal rate and regular rhythm.     Heart  sounds: Normal heart sounds.  Pulmonary:     Effort: Pulmonary effort is normal.     Breath sounds: Normal breath sounds.  Abdominal:     General: Abdomen is flat.     Palpations: Abdomen is soft.     Tenderness: There is no abdominal tenderness.  Musculoskeletal:        General: Normal range of motion.     Cervical back: Normal range of motion.     Right lower leg: Edema (1+ to mid shin) present.     Left lower leg: Edema (1+ to mid shin) present.  Skin:    General: Skin is warm and dry.  Neurological:     Mental Status: She is alert.     Sensory: No sensory deficit.     Motor: No weakness.     Comments: Oriented to person and place, unable to provide any additional history or answer additional questions Following commands in all 4 extremities      (all labs ordered are listed, but only abnormal results are displayed) Labs Reviewed  COMPREHENSIVE METABOLIC PANEL WITH GFR - Abnormal; Notable for the following components:      Result Value   CO2 21 (*)    BUN 44 (*)    Creatinine, Ser 3.11 (*)    Calcium  8.0 (*)    GFR, Estimated 14 (*)    All other components within normal limits  CBC WITH DIFFERENTIAL/PLATELET - Abnormal; Notable for the following components:   RBC 3.01 (*)    Hemoglobin 9.0 (*)    HCT 29.3 (*)    All other components within normal limits  URINALYSIS, W/ REFLEX TO CULTURE (INFECTION SUSPECTED) - Abnormal; Notable for the following components:   Specific Gravity, Urine >1.030 (*)    Hgb urine dipstick TRACE (*)    Protein, ur 100 (*)    All other components within normal limits  BRAIN NATRIURETIC PEPTIDE - Abnormal; Notable for the following components:   B Natriuretic Peptide 498.9 (*)    All other components within normal limits  ETHANOL  RAPID URINE DRUG SCREEN, HOSP PERFORMED  CBG MONITORING, ED    EKG: EKG Interpretation Date/Time:  Saturday May 01 2024 11:42:11 EST Ventricular Rate:  76 PR Interval:  318 QRS Duration:  164 QT  Interval:  403 QTC Calculation: 454 R Axis:   -22  Text Interpretation: Sinus rhythm Prolonged PR interval Consider left atrial enlargement LVH with secondary repolarization abnormality Inferior infarct, old Baseline wander limiting interpretation Confirmed by Ellouise Fine ([id number]) on 05/01/2024 11:46:36 AM  Radiology: CT Head Wo Contrast Result Date: 05/01/2024 CLINICAL DATA:  Mental status change of unknown cause. EXAM: CT HEAD WITHOUT CONTRAST TECHNIQUE:  Contiguous axial images were obtained from the base of the skull through the vertex without intravenous contrast. RADIATION DOSE REDUCTION: This exam was performed according to the departmental dose-optimization program which includes automated exposure control, adjustment of the mA and/or kV according to patient size and/or use of iterative reconstruction technique. COMPARISON:  12/12/2014. FINDINGS: Brain: No evidence of acute infarction, hemorrhage, hydrocephalus, extra-axial collection or mass lesion/mass effect. There is age appropriate volume loss and mild periventricular white matter hypoattenuation consistent with chronic microvascular ischemic change. Vascular: No hyperdense vessel or unexpected calcification. Skull: Normal. Negative for fracture or focal lesion. Sinuses/Orbits: Globes and orbits are unremarkable. Visualized sinuses are clear. Other: None. IMPRESSION: 1. No acute intracranial abnormalities. Electronically Signed   By: [name] Parkins M.D.   On: 05/01/2024 14:24     Procedures   Medications Ordered in the ED - No data to display  Clinical Course as of 05/01/24 1516  Sat May 01, 2024  1351 Son at bedside has recent medication list and discharge summery from PCP visit last week. Does not have recent labs. Appears to have AKI and new anemia compared to prior labs in our system. She is prescribed gabapentin, tizanidine and tramadol  for her back pain. She will need admission for her AMS pending UA and CTH. [VK]  1503 No  acute abnormality on CTH or UA. Suspect AMS from AKI and polypharmacy. Will be admitted for further management.  [VK]    Clinical Course User Index [VK] Kingsley, Elanore Talcott K, DO                                 Medical Decision Making This patient presents to the ED with chief complaint(s) of AMS with pertinent past medical history of CVA, chronic back pain, HTN, CKD, memory loss which further complicates the presenting complaint. The complaint involves an extensive differential diagnosis and also carries with it a high risk of complications and morbidity.    The differential diagnosis includes medication side effect, infection, electrolyte derangement, CVA, ICH, mass effect, hepatorenal dysfunction, CHF  Additional history obtained: Additional history obtained from family and EMS  Records reviewed N/A  ED Course and Reassessment: On patient's arrival she is hemodynamically stable in no acute distress but is tremulous and altered though following commands.  Suspect likely medication induced based on history provided by son but will have additional workup to evaluate for alternative etiologies of her altered mental status.  Her son is on the wait department and has been updated medication list from her most recent primary care visit.  Independent labs interpretation:  The following labs were independently interpreted: AKI on CKD, worsening anemia from baseline  Independent visualization of imaging: - I independently visualized the following imaging with scope of interpretation limited to determining acute life threatening conditions related to emergency care: CTH, which revealed no acute disease  Consultation: - Consulted or discussed management/test interpretation w/ external professional: internal medicine  Consideration for admission or further workup: patient requires admission for AMS Social Determinants of health: N/A    Amount and/or Complexity of Data Reviewed Labs:  ordered. Radiology: ordered.  Risk Decision regarding hospitalization.       Final diagnoses:  Disorientation  AKI (acute kidney injury)  Anemia, unspecified type    ED Discharge Orders     None          Kingsley, Devonna Oboyle K, DO 05/01/24 1516

## 2024-05-01 NOTE — ED Triage Notes (Signed)
 Pt to er with ems, per ems pt is here for swelling in her lower extremities.  Ems states that they talked with son and he was worried about her taking her medications, states that some of the other residents also thought that she was acting off, states that she was a/o times 4 upon arrival, but in the truck had an episode of confusion and now is talking but confused

## 2024-05-01 NOTE — ED Notes (Signed)
 Pt in and out cath. 20f straight cath inserted, drained of yellow/orange cloudy urine. Pt tolerated well.

## 2024-05-01 NOTE — Progress Notes (Signed)
 CSW spoke to the patient's son [name] in regards to the patient his mother. At this time the son has concerns of the mother safety at home as she lives by her self and he is unsure If she is taken he medications correctly. The son also stated that he wanted to know how he can get her into a  ALF facility even if she didn't agree. CSW did explain that was not an option as the patient is her own LG and that would have to be done legally as the ED does not have those resources. This CSW did explain that we can help with short term rehab as well HH which is short term. Son stated that he understood and he just wanted to know how the process works, CSW did tell the son that he also can look into ALF however the patient would need a different type of insurance for long term stay in which that is also other process. CSW explained that if his mother is admitted which he mentioned CSW / RNCM on the floor can assist with more information on a safe DC. AT this time there are no further ICM needs if a new need arises please place consult.   Jean Tiron Suski LCSW-A   05/01/2024 3:03 PM

## 2024-05-01 NOTE — Hospital Course (Addendum)
  Jean Aguilar is an 88 year old woman with a history of hypertension, CKD, prior CVA, chronic back pain, vascular dementia, and polypharmacy who presented with acute altered mental status and was admitted for toxic-metabolic encephalopathy and AKI.  # Acute Toxic-Metabolic Encephalopathy (multifactorial) On admission, the patient exhibited restlessness, tremors, and waxing/waning confusion. History from family and medication review revealed recent prescriptions for gabapentin, tizanidine, and tramadol , all sedating medications likely contributing to encephalopathy, worsened by AKI and underlying dementia. CT head was negative. EEG showed mild generalized slowing, consistent with encephalopathy, without evidence of seizures.  All sedating home medications were discontinued, and delirium precautions were implemented. Mental status steadily improved during hospitalization; she became calm, interactive, and oriented to self but remained cognitively impaired consistent with baseline vascular dementia. SLP cognitive evaluation recommended 24/7 supervision.  # Acute Kidney Injury on CKD Initial creatinine was 3.1 (baseline ~1.4 in 2016). Etiology was likely pre-renal, due to decreased intake during encephalopathy and possible urinary retention. Renal ultrasound showed no hydronephrosis. Foley catheter initially drained ~350 mL; renal function improved with fluids and medication adjustments. Creatinine down-trended to ~2.1 at discharge. Losartan/HCTZ and other nephrotoxins were held.  # Right Hip Pain - Greater Trochanteric Tendinitis Early in hospitalization, the patient experienced severe right hip pain contributing to agitation and difficulty participating in care. X-ray showed no fracture. Pain improved significantly with acetaminophen , lidocaine  patches, conservative management, and time. No further severe pain episodes by discharge.  # Chronic Back Pain Home sedating agents (gabapentin,  tizanidine, tramadol ) were stopped permanently due to encephalopathy risk and poor kidney clearance. Pain was well-controlled with scheduled acetaminophen  and topical therapies.  # Hypoglycemia The patient had mild hypoglycemia (BG 50-60) while NPO and encephalopathic. Corrected with D50 2. SLP cleared her for a dysphagia diet. No further hypoglycemia after resuming PO intake.  # Hypertension Blood pressures fluctuated but improved with medication adjustments. Hydralazine  was discontinued, losartan/HCTZ avoided in the setting of AKI, and amlodipine 10 mg daily continued.  # Chronic Memory Impairment / Vascular Dementia Baseline memory impairment was confirmed with family. During recovery, she remained oriented only to self with limited insight. SLP cognitive evaluation showed severe deficits in memory, problem-solving, initiation, and safety awareness, recommending 24-hour supervision.  # Normocytic Anemia Hemoglobin ranged 8-9 g/dL, consistent with chronic disease. B12 normal. No evidence of bleeding. Outpatient trending recommended.  # Chronic Lower-Extremity Edema Longstanding edema without signs of acute heart failure; diuretics avoided during AKI. Managed conservatively.  Discharge Condition Improved, hemodynamically stable, mental status back to near baseline but with persistent cognitive impairment and safety concerns.  Follow-Up Recommendations for PCP  Medication reconciliation--ensure sedating medications (gabapentin, tizanidine, tramadol ) are NOT restarted.  Monitor renal function: BMP to reassess creatinine recovery and electrolytes.  Blood pressure management, continue amlodipine; reassess need for additional agents once renal function stabilizes.  Cognitive status & safety: Discuss long-term plan for supervision; patient requires 24/7 support per SLP.  Anemia: Repeat CBC and review iron studies if pending.

## 2024-05-01 NOTE — ED Notes (Signed)
 Pt states that she needs to urinate, placed pt on a bed pain, pt was unable to void. Pt placed on 2L via [place].

## 2024-05-01 NOTE — ED Notes (Signed)
 Pt has bilateral pedal pulses via doppler.

## 2024-05-02 ENCOUNTER — Observation Stay (HOSPITAL_COMMUNITY)

## 2024-05-02 ENCOUNTER — Inpatient Hospital Stay (HOSPITAL_COMMUNITY)

## 2024-05-02 DIAGNOSIS — R600 Localized edema: Secondary | ICD-10-CM

## 2024-05-02 DIAGNOSIS — N179 Acute kidney failure, unspecified: Secondary | ICD-10-CM

## 2024-05-02 DIAGNOSIS — N189 Chronic kidney disease, unspecified: Secondary | ICD-10-CM

## 2024-05-02 DIAGNOSIS — D631 Anemia in chronic kidney disease: Secondary | ICD-10-CM

## 2024-05-02 DIAGNOSIS — G9340 Encephalopathy, unspecified: Secondary | ICD-10-CM | POA: Diagnosis not present

## 2024-05-02 DIAGNOSIS — R413 Other amnesia: Secondary | ICD-10-CM

## 2024-05-02 DIAGNOSIS — I10 Essential (primary) hypertension: Secondary | ICD-10-CM

## 2024-05-02 DIAGNOSIS — M25551 Pain in right hip: Secondary | ICD-10-CM

## 2024-05-02 DIAGNOSIS — R569 Unspecified convulsions: Secondary | ICD-10-CM | POA: Diagnosis not present

## 2024-05-02 DIAGNOSIS — G8929 Other chronic pain: Secondary | ICD-10-CM

## 2024-05-02 DIAGNOSIS — M549 Dorsalgia, unspecified: Secondary | ICD-10-CM

## 2024-05-02 DIAGNOSIS — Z91148 Patient's other noncompliance with medication regimen for other reason: Secondary | ICD-10-CM

## 2024-05-02 LAB — URINALYSIS, ROUTINE W REFLEX MICROSCOPIC
Bilirubin Urine: NEGATIVE
Glucose, UA: NEGATIVE mg/dL
Ketones, ur: NEGATIVE mg/dL
Nitrite: NEGATIVE
Protein, ur: 100 mg/dL — AB
Specific Gravity, Urine: 1.025 (ref 1.005–1.030)
pH: 5.5 (ref 5.0–8.0)

## 2024-05-02 LAB — URINALYSIS, W/ REFLEX TO CULTURE (INFECTION SUSPECTED)
Bilirubin Urine: NEGATIVE
Glucose, UA: NEGATIVE mg/dL
Ketones, ur: NEGATIVE mg/dL
Nitrite: NEGATIVE
Protein, ur: 30 mg/dL — AB
RBC / HPF: >50 RBC/hpf (ref 0–5)
Specific Gravity, Urine: 1.02 (ref 1.005–1.030)
pH: 6 (ref 5.0–8.0)

## 2024-05-02 LAB — LACTIC ACID, PLASMA
Lactic Acid, Venous: 1.2 mmol/L (ref 0.5–1.9)
Lactic Acid, Venous: 1.6 mmol/L (ref 0.5–1.9)

## 2024-05-02 LAB — CBC
HCT: 26.2 % — ABNORMAL LOW (ref 36.0–46.0)
Hemoglobin: 8.3 g/dL — ABNORMAL LOW (ref 12.0–15.0)
MCH: 30.5 pg (ref 26.0–34.0)
MCHC: 31.7 g/dL (ref 30.0–36.0)
MCV: 96.3 fL (ref 80.0–100.0)
Platelets: 207 K/uL (ref 150–400)
RBC: 2.72 MIL/uL — ABNORMAL LOW (ref 3.87–5.11)
RDW: 13.8 % (ref 11.5–15.5)
WBC: 6.5 K/uL (ref 4.0–10.5)
nRBC: 0 % (ref 0.0–0.2)

## 2024-05-02 LAB — GLUCOSE, CAPILLARY
Glucose-Capillary: 72 mg/dL (ref 70–99)
Glucose-Capillary: 50 mg/dL — ABNORMAL LOW (ref 70–99)
Glucose-Capillary: 56 mg/dL — ABNORMAL LOW (ref 70–99)
Glucose-Capillary: 62 mg/dL — ABNORMAL LOW (ref 70–99)
Glucose-Capillary: 64 mg/dL — ABNORMAL LOW (ref 70–99)
Glucose-Capillary: 69 mg/dL — ABNORMAL LOW (ref 70–99)
Glucose-Capillary: 92 mg/dL (ref 70–99)
Glucose-Capillary: 84 mg/dL (ref 70–99)
Glucose-Capillary: 86 mg/dL (ref 70–99)

## 2024-05-02 LAB — CK
Total CK: 1079 U/L — ABNORMAL HIGH (ref 38–234)
Total CK: 997 U/L — ABNORMAL HIGH (ref 38–234)

## 2024-05-02 LAB — URINALYSIS, MICROSCOPIC (REFLEX): RBC / HPF: >50 RBC/hpf (ref 0–5)

## 2024-05-02 LAB — BASIC METABOLIC PANEL WITH GFR
Anion gap: 15 (ref 5–15)
BUN: 38 mg/dL — ABNORMAL HIGH (ref 8–23)
CO2: 22 mmol/L (ref 22–32)
Calcium: 7.8 mg/dL — ABNORMAL LOW (ref 8.9–10.3)
Chloride: 104 mmol/L (ref 98–111)
Creatinine, Ser: 2.5 mg/dL — ABNORMAL HIGH (ref 0.44–1.00)
GFR, Estimated: 18 mL/min — ABNORMAL LOW (ref >60)
Glucose, Bld: 128 mg/dL — ABNORMAL HIGH (ref 70–99)
Potassium: 3.5 mmol/L (ref 3.5–5.1)
Sodium: 141 mmol/L (ref 135–145)

## 2024-05-02 LAB — COMPREHENSIVE METABOLIC PANEL WITH GFR
ALT: 11 U/L (ref 0–44)
AST: 30 U/L (ref 15–41)
Albumin: 3.7 g/dL (ref 3.5–5.0)
Alkaline Phosphatase: 69 U/L (ref 38–126)
Anion gap: 14 (ref 5–15)
BUN: 42 mg/dL — ABNORMAL HIGH (ref 8–23)
CO2: 21 mmol/L — ABNORMAL LOW (ref 22–32)
Calcium: 7.8 mg/dL — ABNORMAL LOW (ref 8.9–10.3)
Chloride: 105 mmol/L (ref 98–111)
Creatinine, Ser: 2.76 mg/dL — ABNORMAL HIGH (ref 0.44–1.00)
GFR, Estimated: 16 mL/min — ABNORMAL LOW (ref >60)
Glucose, Bld: 74 mg/dL (ref 70–99)
Potassium: 3.6 mmol/L (ref 3.5–5.1)
Sodium: 140 mmol/L (ref 135–145)
Total Bilirubin: 0.7 mg/dL (ref 0.0–1.2)
Total Protein: 5.9 g/dL — ABNORMAL LOW (ref 6.5–8.1)

## 2024-05-02 LAB — TSH: TSH: 2.112 u[IU]/mL (ref 0.350–4.500)

## 2024-05-02 LAB — FERRITIN: Ferritin: 39 ng/mL (ref 11–307)

## 2024-05-02 LAB — PHOSPHORUS: Phosphorus: 5.3 mg/dL — ABNORMAL HIGH (ref 2.5–4.6)

## 2024-05-02 LAB — VITAMIN B12: Vitamin B-12: 208 pg/mL (ref 180–914)

## 2024-05-02 MED ORDER — LABETALOL HCL 5 MG/ML IV SOLN
5.0000 mg | INTRAVENOUS | Status: DC | PRN
  Administered 2024-05-02 – 2024-05-04 (×3): 5 mg via INTRAVENOUS
  Filled 2024-05-02 (×3): qty 4

## 2024-05-02 MED ORDER — ACETAMINOPHEN 325 MG PO TABS
650.0000 mg | ORAL_TABLET | Freq: Four times a day (QID) | ORAL | Status: DC
  Administered 2024-05-02 – 2024-05-05 (×9): 650 mg via ORAL
  Filled 2024-05-02 (×11): qty 2

## 2024-05-02 MED ORDER — HYDROMORPHONE HCL 1 MG/ML IJ SOLN
0.5000 mg | Freq: Once | INTRAMUSCULAR | Status: AC
  Administered 2024-05-02: 0.5 mg via INTRAVENOUS
  Filled 2024-05-02: qty 0.5

## 2024-05-02 MED ORDER — HALOPERIDOL LACTATE 5 MG/ML IJ SOLN
1.0000 mg | Freq: Once | INTRAMUSCULAR | Status: AC
  Administered 2024-05-02: 1 mg via INTRAVENOUS
  Filled 2024-05-02: qty 1

## 2024-05-02 MED ORDER — DEXTROSE 5 % IV SOLN: INTRAVENOUS | Status: DC

## 2024-05-02 MED ORDER — FAMOTIDINE 20 MG PO TABS
10.0000 mg | ORAL_TABLET | Freq: Every day | ORAL | Status: DC
  Administered 2024-05-02 – 2024-05-05 (×4): 10 mg via ORAL
  Filled 2024-05-02 (×4): qty 1

## 2024-05-02 MED ORDER — DEXTROSE 50 % IV SOLN
25.0000 g | Freq: Once | INTRAVENOUS | Status: AC
  Administered 2024-05-02: 25 g via INTRAVENOUS

## 2024-05-02 MED ORDER — LACTATED RINGERS IV BOLUS
1000.0000 mL | Freq: Once | INTRAVENOUS | Status: AC
  Administered 2024-05-02: 1000 mL via INTRAVENOUS

## 2024-05-02 MED ORDER — INFLUENZA VAC SPLIT HIGH-DOSE 0.5 ML IM SUSY
0.5000 mL | PREFILLED_SYRINGE | INTRAMUSCULAR | Status: DC
  Filled 2024-05-02: qty 0.5

## 2024-05-02 MED ORDER — TRAMADOL HCL 50 MG PO TABS: 50.0000 mg | ORAL_TABLET | Freq: Two times a day (BID) | ORAL | Status: DC | PRN

## 2024-05-02 MED ORDER — DEXTROSE 10 % IV SOLN: INTRAVENOUS | Status: AC

## 2024-05-02 MED ORDER — DEXTROSE 50 % IV SOLN
25.0000 g | Freq: Once | INTRAVENOUS | Status: AC
  Administered 2024-05-02: 25 g via INTRAVENOUS
  Filled 2024-05-02: qty 50

## 2024-05-02 NOTE — Progress Notes (Signed)
 SLP Cancellation Note  Patient Details Name: Jean Aguilar MRN: [id number] DOB: [birth year]   Cancelled treatment:       Reason Eval/Treat Not Completed: Fatigue/lethargy limiting ability to participate (Pt's nurse advised that pt is resting after being agitated and up all night. RN requested that the swallow eval be deferred to allow pt to rest. It was agreed that nursing will contact SLP when pt is awake and able to participate in eval.)  Tressia Labrum I. [place], MS, CCC-SLP Acute Rehabilitation Services Office number [phone]  [name] 05/02/2024, 9:12 AM

## 2024-05-02 NOTE — Progress Notes (Signed)
 EEG complete - results pending

## 2024-05-02 NOTE — Procedures (Signed)
 History: 88 year old female being evaluated for encephalopathy, EEG to rule out  EEG Duration: 23 minutes  Sedation: none  Patient State: Awake and drowsy  Technique: This EEG was acquired with electrodes placed according to the International 10-20 electrode system (including Fp1, Fp2, F3, F4, C3, C4, P3, P4, O1, O2, T3, T4, T5, T6, A1, A2, Fz, Cz, Pz). The following electrodes were missing or displaced: none.   Background: The background consists of intermixed alpha and beta activities. There is a well defined posterior dominant rhythm of 8-9 Hz that attenuates with eye opening.  There is also sleep is not recorded.  Photic stimulation: Physiologic driving is not performed  EEG Abnormalities: 1) mild generalized irregular slow activity  Clinical Interpretation: This EEG is consistent with a mild generalized nonspecific cerebral dysfunction (encephalopathy). There was no seizure or seizure predisposition recorded on this study. Please note that lack of epileptiform activity on EEG does not preclude the possibility of epilepsy.   Aisha Seals, MD [facility]   If 7pm- 7am, please page neurology on call as listed in [facility]ION.

## 2024-05-02 NOTE — Evaluation (Signed)
 Clinical/Bedside Swallow Evaluation Patient Details  Name: Jean Aguilar MRN: [id number] Date of Birth: [birth year]  Today's Date: 05/02/2024 Time: SLP Start Time (ACUTE ONLY): 1350 SLP Stop Time (ACUTE ONLY): 1416 SLP Time Calculation (min) (ACUTE ONLY): 26 min  Past Medical History:  Past Medical History:  Diagnosis Date   Abnormal myocardial perfusion study    lung/heart ratio was measured at 0.29   Arthritis    full body (02/20/2012)   Asthma    Asthma    Carotid stenosis    Cervical spondylosis with myelopathy    Chronic bronchitis (HCC)    get it qd (02/20/2012)   Complication of anesthesia    WAKE UP DURING KNEE ARTHROSCOPY   Fibromyalgia    Fibromyalgia    GERD (gastroesophageal reflux disease)    H/O echocardiogram    Headache(784.0)    Hemorrhoids    High cholesterol    HLD (hyperlipidemia)    Hypertension    Hypothyroidism    used to take Synthroid; don't take anything now (02/20/2012)   Irritable colon    Kidney stones    Lactose intolerance    Sleep apnea    10 + 12 YRS NO MACHINE   Type II diabetes mellitus (HCC)    Past Surgical History:  Past Surgical History:  Procedure Laterality Date   ABDOMINAL HYSTERECTOMY  ~ 1979   APPENDECTOMY  1950's   BREAST BIOPSY     bilaterally   CARDIAC CATHETERIZATION     CYSTOSCOPY W/ URETERAL STENT PLACEMENT  1970's?   CYSTOSCOPY W/ URETERAL STENT REMOVAL  1970's?   ELBOW SURGERY     bilateral; left for fluid; right to get rid of nerve/muscles fusing themselves together 02/20/2012   KNEE ARTHROSCOPY     BILATERAL    MASTECTOMY COMPLETE / SIMPLE     right; came back clean; simple   SHOULDER ARTHROSCOPY     RIGHT   TONSILLECTOMY  1950's   TOTAL KNEE ARTHROPLASTY  02/20/2012   left   TOTAL KNEE ARTHROPLASTY  02/20/2012   Procedure: TOTAL KNEE ARTHROPLASTY;  Surgeon: Jean JULIANNA Pepper, MD;  Location: [facility] OR;  Service: Orthopedics;  Laterality: Left;   HPI:  Pt is an 88 y.o. female who presented from  home on 11/29 with acute altered mental status and chronic lower-extremity swelling. Pt failed [facility] swallow screen 11/29 due to coughing. PMH: hypertension, CKD?, prior CVA, chronic back pain, and baseline cognitive impairment (on donepezil in the past)    Assessment / Plan / Recommendation  Clinical Impression  Pt was seen for bedside swallow evaluation with her son present who denied the pt having a history of oropharyngeal dysphagia. Pt was alert, and communicative, joking with SLP and son. Pt did not open her mouth for oral inspection and did not follow directions for a completion of an oral mechanism exam. Pt's son reported that the pt has limited teeth and does not wear dentures. Pt exhibited coughing at baseline and this was noted inconsistently between liquid trials of all viscosities; SLP questions relatedness to swallowing. Mastication was mildly prolonged and oral clearance appeared adequate based on the brief observation that pt allowed she accepted subsequent trials. A dysphagia 3 diet with thin liquids is recommended at this time with strict observance of swallowing precautions. SLP will follow closely to ensure tolerance and to assess need for further intervention. SLP Visit Diagnosis: Dysphagia, unspecified (R13.10)    Aspiration Risk  Mild aspiration risk    Diet Recommendation Dysphagia 3 (Mech  soft);Thin liquid    Liquid Administration via: Cup;No straw Medication Administration: Whole meds with puree Supervision: Staff to assist with self feeding Compensations: Slow rate;Small sips/bites Postural Changes: Seated upright at 90 degrees    Other  Recommendations Oral Care Recommendations: Oral care BID     Assistance Recommended at Discharge    Functional Status Assessment Patient has had a recent decline in their functional status and demonstrates the ability to make significant improvements in function in a reasonable and predictable amount of time.  Frequency and Duration  min 2x/week  2 weeks       Prognosis Prognosis for improved oropharyngeal function: Good Barriers to Reach Goals: Cognitive deficits      Swallow Study   General Date of Onset: 05/01/24 HPI: Pt is an 88 y.o. female who presented from home on 11/29 with acute altered mental status and chronic lower-extremity swelling. Pt failed [facility] swallow screen 11/29 due to coughing. PMH: hypertension, CKD?, prior CVA, chronic back pain, and baseline cognitive impairment (on donepezil in the past) Type of Study: Bedside Swallow Evaluation Previous Swallow Assessment: none Diet Prior to this Study: NPO Temperature Spikes Noted: No Respiratory Status: Room air History of Recent Intubation: No Behavior/Cognition: Confused;Cooperative;Doesn't follow directions;Requires cueing;Distractible Oral Cavity Assessment: Within Functional Limits Oral Care Completed by SLP: No Oral Cavity - Dentition: Missing dentition;Poor condition Vision: Functional for self-feeding Self-Feeding Abilities: Needs assist Patient Positioning: Upright in bed;Postural control adequate for testing Baseline Vocal Quality: Normal Volitional Cough: Cognitively unable to elicit Volitional Swallow: Unable to elicit    Oral/Motor/Sensory Function Overall Oral Motor/Sensory Function:  (unable to assess)   Ice Chips Ice chips: Not tested   Thin Liquid Thin Liquid: Impaired Presentation: Cup;Straw Pharyngeal  Phase Impairments: Throat Clearing - Immediate;Cough - Delayed (subtle)    Nectar Thick Nectar Thick Liquid: Impaired Presentation: Cup;Straw Pharyngeal Phase Impairments: Cough - Immediate;Cough - Delayed   Honey Thick Honey Thick Liquid: Impaired Presentation: Cup Pharyngeal Phase Impairments: Cough - Immediate;Cough - Delayed   Puree Puree: Within functional limits Presentation: Spoon   Solid     Solid: Impaired Presentation: Self Fed Oral Phase Impairments: Impaired mastication Oral Phase Functional Implications:  Impaired mastication     Jean Aguilar I. [place], MS, CCC-SLP Acute Rehabilitation Services Office number [phone]  Jean Aguilar 05/02/2024,2:53 PM

## 2024-05-02 NOTE — Evaluation (Addendum)
 Physical Therapy Evaluation Patient Details Name: Jean Aguilar MRN: [id number] DOB: March 07, 1936 Today's Date: 05/02/2024  History of Present Illness  88 y.o. female presents to [facility]/01/24 with AMS and chronic LE swelling. CT head negative. Admitted with acute encephalopathy and AKI. PMHx: hypertension, CKD?, prior CVA, chronic back pain, T2DM, fibromyalgia, COPD/asthma, carotid disease and baseline cognitive impairment (on donepezil in the past)   Clinical Impression  PTA pt would furniture surf in the home and use SP vs RW in the community. Pt presents below functional mobility baseline with generalized weakness, impaired cognition, and deficits in balance/gait pattern. Pt required ModA to stand with RW with ability to ambulate a short distance with MinA. Pt would drift right/left with minimal ability to correct with visual and tactile cueing. Pt also had a few instances of B knees moving quickly from flexion/extension with pt reporting that her legs felt weak. Pt has intermittent assist available from family. Recommending <3hrs post acute rehab to work on independence with mobility and reduce caregiver burden. Pt/family in agreement with plan. Acute PT to follow.   84% SpO2 on RA 95% SpO2 on 2L         If plan is discharge home, recommend the following: A lot of help with walking and/or transfers;A lot of help with bathing/dressing/bathroom;Assist for transportation;Help with stairs or ramp for entrance   Can travel by private vehicle   No    Equipment Recommendations None recommended by PT     Functional Status Assessment Patient has had a recent decline in their functional status and demonstrates the ability to make significant improvements in function in a reasonable and predictable amount of time.     Precautions / Restrictions Precautions Precautions: Fall Recall of Precautions/Restrictions: Impaired Precaution/Restrictions Comments: watch O2 Restrictions Weight Bearing  Restrictions Per Provider Order: No      Mobility  Bed Mobility Overal bed mobility: Needs Assistance Bed Mobility: Supine to Sit    Supine to sit: Mod assist, HOB elevated, Used rails    General bed mobility comments: VCs for hand placement, A for trunk raise and to scoot forward to EOB    Transfers Overall transfer level: Needs assistance Equipment used: Rolling walker (2 wheels) Transfers: Sit to/from Stand Sit to Stand: Mod assist    General transfer comment: ModA to boost-up with cues for initiation and hand placement    Ambulation/Gait Ambulation/Gait assistance: Min assist Gait Distance (Feet): 40 Feet (x2) Assistive device: Rolling walker (2 wheels) Gait Pattern/deviations: Drifts right/left, Step-through pattern, Decreased stride length Gait velocity: decr     General Gait Details: Drifts right/left with minimal ability to correct with cueing. MinA for RW management and steadying assist. x3 instances of B knees moving quickly from flexion to extension (? asterexis). Pt reported her legs felt weak     Balance Overall balance assessment: Needs assistance Sitting-balance support: No upper extremity supported, Feet supported Sitting balance-Leahy Scale: Fair     Standing balance support: Bilateral upper extremity supported, During functional activity, Reliant on assistive device for balance Standing balance-Leahy Scale: Poor        Pertinent Vitals/Pain Pain Assessment Pain Assessment: Faces Faces Pain Scale: Hurts a little bit Pain Location: all over with movement to get to EOB Pain Descriptors / Indicators: Sore Pain Intervention(s): Limited activity within patient's tolerance, Monitored during session, Repositioned    Home Living Family/patient expects to be discharged to:: Private residence Living Arrangements: Alone Available Help at Discharge: Family;Available PRN/intermittently Type of Home: Apartment Home Access:  Stairs to enter Entrance  Stairs-Rails: None Entrance Stairs-Number of Steps: 1   Home Layout: One level Home Equipment: Agricultural [employer]ant (2 wheels);Cane - single point      Prior Function Prior Level of Function : Independent/Modified Independent    Mobility Comments: Ind with RW or SP cane in the community. Furniture surfs in the apartment ADLs Comments: Ind     Extremity/Trunk Assessment   Upper Extremity Assessment Upper Extremity Assessment: Defer to OT evaluation    Lower Extremity Assessment Lower Extremity Assessment: Generalized weakness       Communication   Communication Communication: No apparent difficulties    Cognition Arousal: Alert Behavior During Therapy: Restless   PT - Cognitive impairments: Orientation, Awareness, Memory, Attention, Sequencing, Problem solving, Safety/Judgement, Initiation   Orientation impairments: Situation, Time    PT - Cognition Comments: Delayed initiation to commands and easily distracted Following commands: Impaired Following commands impaired: Follows one step commands inconsistently, Follows one step commands with increased time     Cueing Cueing Techniques: Verbal cues, Tactile cues     General Comments General comments (skin integrity, edema, etc.): Pt's O2 on RA 84% on 2 liters 95%     PT Assessment Patient needs continued PT services  PT Problem List Decreased strength;Decreased activity tolerance;Decreased balance;Decreased mobility;Decreased cognition;Decreased knowledge of use of DME;Decreased safety awareness;Decreased knowledge of precautions       PT Treatment Interventions DME instruction;Gait training;Functional mobility training;Therapeutic activities;Therapeutic exercise;Balance training;Neuromuscular re-education;Patient/family education;Stair training    PT Goals (Current goals can be found in the Care Plan section)  Acute Rehab PT Goals Patient Stated Goal: to get out of bed PT Goal Formulation: With patient/family Time  For Goal Achievement: 05/16/24 Potential to Achieve Goals: Fair    Frequency Min 2X/week     Co-evaluation PT/OT/SLP Co-Evaluation/Treatment: Yes Reason for Co-Treatment: For patient/therapist safety;To address functional/ADL transfers;Necessary to address cognition/behavior during functional activity PT goals addressed during session: Mobility/safety with mobility;Balance;Proper use of DME OT goals addressed during session: ADL's and self-care;Strengthening/ROM;Proper use of Adaptive equipment and DME       AM-PAC PT 6 Clicks Mobility  Outcome Measure Help needed turning from your back to your side while in a flat bed without using bedrails?: A Little Help needed moving from lying on your back to sitting on the side of a flat bed without using bedrails?: A Lot Help needed moving to and from a bed to a chair (including a wheelchair)?: A Lot Help needed standing up from a chair using your arms (e.g., wheelchair or bedside chair)?: A Lot Help needed to walk in hospital room?: A Little Help needed climbing 3-5 steps with a railing? : A Lot 6 Click Score: 14    End of Session Equipment Utilized During Treatment: Gait belt;Oxygen  Activity Tolerance: Patient tolerated treatment well Patient left: in bed;with call bell/phone within reach;with bed alarm set;with family/visitor present (mitts donned) Nurse Communication: Mobility status PT Visit Diagnosis: Unsteadiness on feet (R26.81);Other abnormalities of gait and mobility (R26.89);Muscle weakness (generalized) (M62.81)    Time: 8557-8487 PT Time Calculation (min) (ACUTE ONLY): 30 min   Charges:   PT Evaluation $PT Eval Low Complexity: 1 Low   PT General Charges $$ ACUTE PT VISIT: 1 Visit       Kate ORN, PT, DPT Secure Chat Preferred  Rehab Office [phone]   Kate BRAVO Wendolyn 05/02/2024, 4:00 PM

## 2024-05-02 NOTE — Progress Notes (Signed)
 HD#0 SUBJECTIVE:  Patient Summary: Jean Aguilar is a 88 y.o. female with PMH of of hypertension, CKD?, prior CVA, chronic back pain, and baseline cognitive impairment (on donepezil in the past) who presents with acute altered mental status and chronic lower-extremity swelling.   Overnight Events: Nursing alerted team to worsening agitation due to severe pain. IV Tylenol , IV Robaxin , and lidocaine  patches provided no relief. Patient also noted to have minimal urine output; Foley placed with 350 cc return, concerning for AKI on chronic urinary issues. On exam, she was extremely uncomfortable but alert and oriented, with significant right hip pain radiating to the knee and markedly limited ROM. Intermittent myoclonic jerks observed, possibly related to gabapentin accumulation in the setting of AKI. Mental status appeared at baseline. Further evaluation initiated.  Plan: Foley catheter placed  CBC, RFP, CK ordered Right hip X-ray One-time IV Dilaudid  0.5 mg  EEG pending; may need brain MRI once pain controlled Order for soft wrist restraints placed as well as IV haldol. Soft wrist restraints applied   Interim History:  She was sleeping comfortably and in no acute distress. I did not wake her, as she was unable to sleep last night, but I observed her closely. There were no jerking movements or myoclonus noted.  OBJECTIVE:  Vital Signs: Vitals:   05/01/24 2037 05/02/24 0100 05/02/24 0255 05/02/24 0500  BP: (!) 164/74 (!) 168/76  (!) 176/68  Pulse: 68 74 74 68  Resp: 19 18 18    Temp: 100 F (37.8 C) 99.1 F (37.3 C) 98.9 F (37.2 C)   TempSrc: Oral Oral Oral   SpO2: 97% 96% 94%   Weight:      Height:       Supplemental O2: Room Air SpO2: 94 % O2 Flow Rate (L/min): 3 L/min  Filed Weights   05/01/24 1121  Weight: 83.9 kg     Intake/Output Summary (Last 24 hours) at 05/02/2024 0654 Last data filed at 05/02/2024 0600 Gross per 24 hour  Intake 1324.8 ml  Output 1150 ml   Net 174.8 ml   Net IO Since Admission: 174.8 mL [05/02/24 0654]  Physical Exam: Physical Exam Constitutional: Well-appearing elderly woman, lying in bed, tremulous but in no acute distress. HENT: NCAT, mucous membranes moist. Eyes: Conjunctiva clear; PERRL; EOMI. Neck: Supple. Cardiovascular: RRR, no murmurs. Pulmonary: Normal work of breathing; lungs has wheeze bilaterally. Abdominal: Soft, non-tender, non-distended. MSK: Normal bulk and tone; 1+ bilateral lower-extremity edema. Neurological: Alert to person; follows simple commands; no focal deficits; moves all extremities; tremulous. Skin: Warm and dry. Psych: Pleasant,  confused.   Patient Lines/Drains/Airways Status     Active Line/Drains/Airways     Name Placement date Placement time Site Days   Peripheral IV 05/02/24 22 G Anterior;Distal;Left Wrist 05/02/24  0512  Wrist  less than 1   Urethral Catheter Hiromy, NT3 16 Fr. 05/01/24  --  --  1            Pertinent labs and imaging:      Latest Ref Rng & Units 05/02/2024   12:10 AM 05/01/2024   11:45 AM 01/02/2021    3:05 PM  CBC  WBC 4.0 - 10.5 K/uL 6.5  8.4  5.6   Hemoglobin 12.0 - 15.0 g/dL 8.3  9.0  87.5   Hematocrit 36.0 - 46.0 % 26.2  29.3  38.6   Platelets 150 - 400 K/uL 207  243  273        Latest Ref Rng & Units  05/02/2024   12:10 AM 05/01/2024   11:45 AM 01/02/2021    3:05 PM  CMP  Glucose 70 - 99 mg/dL 74  85  899   BUN 8 - 23 mg/dL 42  44  12   Creatinine 0.44 - 1.00 mg/dL 7.23  6.88  8.60   Sodium 135 - 145 mmol/L 140  141  142   Potassium 3.5 - 5.1 mmol/L 3.6  3.6  3.6   Chloride 98 - 111 mmol/L 105  105  107   CO2 22 - 32 mmol/L 21  21  25    Calcium  8.9 - 10.3 mg/dL 7.8  8.0  9.0   Total Protein 6.5 - 8.1 g/dL 5.9  6.7  6.8   Total Bilirubin 0.0 - 1.2 mg/dL 0.7  0.6  0.5   Alkaline Phos 38 - 126 U/L 69  75    AST 15 - 41 U/L 30  17  11    ALT 0 - 44 U/L 11  10  7      DG HIP UNILAT WITH PELVIS 2-3 VIEWS RIGHT Result Date:  05/02/2024 EXAM: 2 OR MORE VIEW(S) XRAY OF THE UNILATERAL HIP 05/02/2024 01:27:00 AM COMPARISON: 4 / 11 / 23. CLINICAL HISTORY: Hip pain FINDINGS: BONES AND JOINTS: No acute fracture or focal osseous lesion. The hip joint is maintained. Mild degenerative changes of the hips. LUMBAR SPINE: Degenerative changes of the lower lumbar spine. SOFT TISSUES: Vascular calcifications. IMPRESSION: 1. Mild degenerative changes of the hips. Electronically signed by: Norman Gatlin MD 05/02/2024 01:31 AM EST RP Workstation: [id number]   US  RENAL Result Date: 05/01/2024 CLINICAL DATA:  Acute kidney injury. EXAM: RENAL / URINARY TRACT ULTRASOUND COMPLETE COMPARISON:  CT, 12/16/2014.  Ultrasound, 06/13/2017. FINDINGS: Right Kidney: Renal measurements: 9.0 x 4.0 x 5.0 cm = volume: 93.9 mL. Increased parenchymal echogenicity. Right renal cortical thinning. Small exophytic cyst, midpole, 9 x 8 x 8 mm. No other masses, no stones and no hydronephrosis. Left Kidney: Renal measurements: 12.3 x 6.4 x 5.1 cm = volume: 210.4 mL. Increased parenchymal echogenicity. Upper pole cyst measuring 4.2 x 3.4 x 3.7 cm. Small exophytic upper pole cyst measuring 9 x 7 x 8 mm. No other masses, no stones and no hydronephrosis. Bladder: Appears normal for degree of bladder distention. Other: None. IMPRESSION: 1. No acute findings. 2. Increased renal parenchymal echogenicity suggests medical renal disease. 3. Right renal cortical thinning and mild reduction in the right renal size compared to the left. 4. Renal cysts. Electronically Signed   By: [name] Parkins M.D.   On: 05/01/2024 17:50   DG Chest Port 1 View Result Date: 05/01/2024 CLINICAL DATA:  Hypoxia EXAM: PORTABLE CHEST 1 VIEW COMPARISON:  Chest x-ray 12/16/2014 FINDINGS: Generator overlies the left chest. The heart is enlarged. There are atherosclerotic calcifications of the aorta. There is nodular prominence in the upper right paratracheal region which is new from prior. The lungs are  otherwise clear. There is no pleural effusion or pneumothorax. Surgical clips overlie the right axilla. No acute fractures are seen. IMPRESSION: 1. Nodular prominence in the upper right paratracheal region is new from prior. This may be due to vascular structures, however adenopathy or mass is not excluded. Recommend further evaluation with CT of the chest. 2. Cardiomegaly. Electronically Signed   By: Greig Pique M.D.   On: 05/01/2024 16:27   CT Head Wo Contrast Result Date: 05/01/2024 CLINICAL DATA:  Mental status change of unknown cause. EXAM: CT HEAD WITHOUT CONTRAST TECHNIQUE: Contiguous axial  images were obtained from the base of the skull through the vertex without intravenous contrast. RADIATION DOSE REDUCTION: This exam was performed according to the departmental dose-optimization program which includes automated exposure control, adjustment of the mA and/or kV according to patient size and/or use of iterative reconstruction technique. COMPARISON:  12/12/2014. FINDINGS: Brain: No evidence of acute infarction, hemorrhage, hydrocephalus, extra-axial collection or mass lesion/mass effect. There is age appropriate volume loss and mild periventricular white matter hypoattenuation consistent with chronic microvascular ischemic change. Vascular: No hyperdense vessel or unexpected calcification. Skull: Normal. Negative for fracture or focal lesion. Sinuses/Orbits: Globes and orbits are unremarkable. Visualized sinuses are clear. Other: None. IMPRESSION: 1. No acute intracranial abnormalities. Electronically Signed   By: [name] Parkins M.D.   On: 05/01/2024 14:24    ASSESSMENT/PLAN:  Assessment: Principal Problem:   AMS (altered mental status) Active Problems:   HTN (hypertension)   CKD (chronic kidney disease)   Plan:  Sintia M. Schiltz is an 88 year old woman with a history of essential hypertension, CKD, prior CVA/TIA-like episodes (2016), chronic back pain on multiple sedating medications,  chronic memory impairment (suspected vascular dementia/mild cognitive impairment), and polypharmacy, who presented with acute altered mental status, restlessness, and tremulousness, and is being admitted for acute encephalopathy likely from polypharmacy + AKI on hospital day 0.   #Acute Encephalopathy Likely multifactorial: polypharmacy (gabapentin, tizanidine, tramadol ), AKI impairing clearance, underlying cognitive impairment, possible dehydration, and urinary retention. CT head negative; no focal deficits. UA without clear infection. Tremulous and restless but following commands.  Last night: Nursing alerted team to worsening agitation due to severe pain. IV Tylenol , IV Robaxin , and lidocaine  patches provided no relief. Patient also noted to have minimal urine output; Foley placed with 350 cc return, concerning for AKI on chronic urinary issues. On exam, she was extremely uncomfortable but alert and oriented, with significant right hip pain radiating to the knee and markedly limited ROM. Intermittent myoclonic jerks observed, possibly related to gabapentin accumulation in the setting of AKI. Mental status appeared at baseline. Further evaluation initiated.One-time IV Dilaudid  0.5 mg.Order for soft wrist restraints placed as well as IV haldol. Soft wrist restraints applied. Bladder scan: was retaining. Renal US : No hydronephrosis  B12: 208 normal.  Plan: - Hold gabapentin, tizanidine, tramadol  - Delirium precautions; frequent reorientation - Soil scientist - Neuro checks q4h - Foley Catheter  - Check TSH, B12, - Avoid sedatives/anticholinergics - EEG for myoclonus movements  - PT/OT/CSW for safety and discharge planning  # AKI vs CKD Progression Improving. Cr: 2.7 down from 3.1; previous known Cr 1.4 (2016). Likely prerenal from poor PO intake vs progression of CKD; post-renal possible given retention. Plan: - Trend BMP - Foley Cath - Renal ultrasound - Gentle IV fluids if needed - Avoid  nephrotoxins - Hold lisinopril  and HCTZ - Monitor urine output/bladder scans   #Chronic Back Pain and Acute Right Hip Pain Patient with chronic back pain now with new severe right hip pain radiating to the knee, limiting ROM on exam and contributing to agitation. Right hip X-ray shows no acute fracture or dislocation; only degenerative changes. Pain likely multifactorial (degenerative joint disease, muscle spasm, possible radicular component), worsened by immobility and delirium. Sedating medications held due to encephalopathy and AKI. Plan: - Scheduled acetaminophen  650 QID - Topical lidocaine  patches - Start low dose Tramadol  in case of severe pain - Avoid gabapentin, tizanidine, tramadol  (held) - Low-dose IV opioid PRN for severe pain  - PT/OT evaluation once agitation improves - If pain persists or  exam continues limited: consider CT hip to rule out occult fracture - Heat packs and repositioning for comfort - Avoid NSAIDs given AKI  #Essential Hypertension Home regimen: hydralazine , losartan/HCTZ, amlodipine. Plan: - Continue hydralazine  - Hold losartan/HCTZ in setting of AKI - Resume amlodipine as needed once stable  #Hyperlipidemia - Continue On Crestor  20 mg.  #Chronic Memory Impairment On memantine and Aricept per son's report. Plan: - Continue memantine - Hold donepezil until mental status improves  #Normocytic Anemia Hgb 8.f from 9.0; new from prior.B12 is normal, maybe dilutional.  Plan: - Iron studies, ferritin - Trend CBC  #Chronic Bilateral LE Edema Reported for months; BNP elevated but exam not volume overloaded. Plan: - Leg elevation - Compression as tolerated - Avoid diuretics during AKI  #Safety / Disposition Concerns Lives alone; poor medication adherence; son concerned for cognitive decline. Plan: - PT/OT/CSW evaluations - Discuss need for supervised setting vs home health - Medication reconciliation when stable   Best practice: Diet: NPO  before clear bedside SLP VTE: Heparin  IVF: None,None Code: Full   Disposition planning: Prior to Admission Living Arrangement: Home, living alone Anticipated Discharge Location: Home   Dispo: Admit patient to Observation with expected length of stay less than 2 midnights.   Signature:  Breindel Collier [facility]  6:54 AM, 05/02/2024  On Call pager [phone]

## 2024-05-02 NOTE — Evaluation (Signed)
 Occupational Therapy Evaluation Patient Details Name: Jean Aguilar MRN: [id number] DOB: 1935/11/30 Today's Date: 05/02/2024   History of Present Illness   88 y.o. female presents to [facility] 05/01/24 with AMS and chronic LE swelling. CT head negative. Admitted with acute encephalopathy and AKI. PMHx: hypertension, CKD?, prior CVA, chronic back pain, T2DM, fibromyalgia, COPD/asthma, carotid disease and baseline cognitive impairment (on donepezil in the past)     Clinical Impressions This 88 yo female admitted with above presents to acute OT with PLOF of living on her own (with family questioning if this was really safe or not) and doing her own basic ADLs and IADLs. She currently is setup-Mod A with basic ADLs from a RW standpoint. She will continue to benefit from acute OT with follow up from continued inpatient follow up therapy, <3 hours/day.      If plan is discharge home, recommend the following:   A little help with walking and/or transfers;A lot of help with bathing/dressing/bathroom;Assistance with cooking/housework;Assist for transportation;Help with stairs or ramp for entrance;Direct supervision/assist for medications management;Direct supervision/assist for financial management     Functional Status Assessment   Patient has had a recent decline in their functional status and demonstrates the ability to make significant improvements in function in a reasonable and predictable amount of time.     Equipment Recommendations   Other (comment) (TBD next venue)      Precautions/Restrictions   Precautions Precautions: Fall Recall of Precautions/Restrictions: Impaired Restrictions Weight Bearing Restrictions Per Provider Order: No     Mobility Bed Mobility Overal bed mobility: Needs Assistance Bed Mobility: Supine to Sit     Supine to sit: Mod assist, HOB elevated, Used rails     General bed mobility comments: VCs for hand placement, A need to scoot forward to  EOB    Transfers Overall transfer level: Needs assistance Equipment used: Rolling walker (2 wheels) Transfers: Sit to/from Stand Sit to Stand: Min assist                  Balance Overall balance assessment: Needs assistance Sitting-balance support: No upper extremity supported, Feet supported Sitting balance-Leahy Scale: Fair     Standing balance support: Bilateral upper extremity supported, During functional activity, Reliant on assistive device for balance Standing balance-Leahy Scale: Poor Standing balance comment: a few instances of asterixis in legs while standing/ambulating                           ADL either performed or assessed with clinical judgement   ADL Overall ADL's : Needs assistance/impaired Eating/Feeding: Independent;Sitting   Grooming: Set up;Sitting   Upper Body Bathing: Minimal assistance;Sitting   Lower Body Bathing: Moderate assistance Lower Body Bathing Details (indicate cue type and reason): min A sit<>stand Upper Body Dressing : Minimal assistance;Sitting   Lower Body Dressing: Maximal assistance Lower Body Dressing Details (indicate cue type and reason): min A sit<>stand Toilet Transfer: Minimal assistance;Ambulation;Rolling walker (2 wheels) Toilet Transfer Details (indicate cue type and reason): simulated bed>out into hallway a short distance and back to bed in room Toileting- Clothing Manipulation and Hygiene: Moderate assistance Toileting - Clothing Manipulation Details (indicate cue type and reason): min A sit<>stand             Vision Patient Visual Report: No change from baseline              Pertinent Vitals/Pain Pain Assessment Pain Assessment: Faces Faces Pain Scale: Hurts a little bit Pain  Location: all over with movement to get to EOB Pain Descriptors / Indicators: Sore Pain Intervention(s): Limited activity within patient's tolerance, Monitored during session, Repositioned     Extremity/Trunk  Assessment Upper Extremity Assessment Upper Extremity Assessment: Overall WFL for tasks assessed;RUE deficits/detail           Communication Communication Communication: No apparent difficulties   Cognition Arousal: Alert Behavior During Therapy: Restless Cognition: Cognition impaired   Orientation impairments: Time, Situation Awareness: Online awareness impaired Memory impairment (select all impairments): Declarative long-term memory Attention impairment (select first level of impairment): Sustained attention Executive functioning impairment (select all impairments): Initiation, Sequencing, Problem solving OT - Cognition Comments: Pt quite entertaining and pleasant with her comments throuhout session. Don't call me Jean Aguilar, well what do we call you, call me Jean Aguilar, but not [employer]. Laughing a lot                 Following commands: Impaired Following commands impaired: Follows one step commands inconsistently, Follows one step commands with increased time     Cueing  General Comments   Cueing Techniques: Verbal cues;Tactile cues  Pt's O2 on RA 84% on 2 liters 95%           Home Living Family/patient expects to be discharged to:: Private residence Living Arrangements: Alone Available Help at Discharge: Family;Available PRN/intermittently Type of Home: Apartment Home Access: Stairs to enter Entrance Stairs-Number of Steps: 1 Entrance Stairs-Rails: None Home Layout: One level     Bathroom Shower/Tub: Tub/shower unit         Home Equipment: Agricultural Consultant (2 wheels);Cane - single point          Prior Functioning/Environment Prior Level of Function : Independent/Modified Independent             Mobility Comments: Ind with RW or SP cane. Furniture surfs in the apartment      OT Problem List: Decreased strength;Impaired balance (sitting and/or standing);Decreased cognition;Decreased safety awareness;Pain;Obesity   OT Treatment/Interventions:  Self-care/ADL training;DME and/or AE instruction;Balance training;Patient/family education      OT Goals(Current goals can be found in the care plan section)   Acute Rehab OT Goals Patient Stated Goal: to get up and move OT Goal Formulation: With patient/family Time For Goal Achievement: 05/16/24 Potential to Achieve Goals: Good   OT Frequency:  Min 2X/week    Co-evaluation PT/OT/SLP Co-Evaluation/Treatment: Yes Reason for Co-Treatment: For patient/therapist safety;To address functional/ADL transfers;Necessary to address cognition/behavior during functional activity PT goals addressed during session: Mobility/safety with mobility;Balance;Proper use of DME OT goals addressed during session: ADL's and self-care;Strengthening/ROM;Proper use of Adaptive equipment and DME      AM-PAC OT 6 Clicks Daily Activity     Outcome Measure Help from another person eating meals?: None Help from another person taking care of personal grooming?: A Little Help from another person toileting, which includes using toliet, bedpan, or urinal?: A Lot Help from another person bathing (including washing, rinsing, drying)?: A Lot Help from another person to put on and taking off regular upper body clothing?: A Lot Help from another person to put on and taking off regular lower body clothing?: A Lot 6 Click Score: 15   End of Session Equipment Utilized During Treatment: Gait belt;Rolling walker (2 wheels);Oxygen  (2 liters) Nurse Communication: Mobility status (via secure chat)  Activity Tolerance: Patient tolerated treatment well Patient left: in bed;with call bell/phone within reach;with bed alarm set  OT Visit Diagnosis: Unsteadiness on feet (R26.81);Other abnormalities of gait and mobility (R26.89);Muscle weakness (  generalized) (M62.81);Other symptoms and signs involving cognitive function;Pain Pain - part of body:  (all over with movement to EOB)                Time: 8558-8487 OT Time Calculation  (min): 31 min Charges:  OT General Charges $OT Visit: 1 Visit OT Evaluation $OT Eval Moderate Complexity: 1 Mod  Cathy L. OT Acute Rehabilitation Services Office [phone]    Rodgers Dorothyann Distel 05/02/2024, 3:28 PM

## 2024-05-02 NOTE — Progress Notes (Signed)
 PT Cancellation Note  Patient Details Name: Jean Aguilar MRN: [id number] DOB: [birth year]   Cancelled Treatment:    Reason Eval/Treat Not Completed: Other (comment) (Pt was given Haldol last night due to confusion/combativeness. Pt finally went to sleep around 6:30 AM. In speaking to RN, will let pt rest for now and check back this PM for appropriateness of therapy evaluations.)  Cattie Tineo W, PT, DPT Secure Chat Preferred  Rehab Office [phone]  Kate BRAVO Wendolyn 05/02/2024, 9:56 AM

## 2024-05-02 NOTE — Progress Notes (Signed)
 Notified provider that patient was strict NPO due to failling bedside swallow. Pt has elevated bp, if can she get something IV to maintain her bp. Provider states will address, awaiting orders.

## 2024-05-02 NOTE — Care Management Obs Status (Signed)
 MEDICARE OBSERVATION STATUS NOTIFICATION   Patient Details  Name: LOUCILLE TAKACH MRN: [id number] Date of Birth: [birth year]   Medicare Observation Status Notification Given:  Yes    Mollye Guinta G., RN 05/02/2024, 8:58 AM

## 2024-05-02 NOTE — Progress Notes (Signed)
 OT Cancellation Note  Patient Details Name: Jean Aguilar MRN: [id number] DOB: 1936/05/23   Cancelled Treatment:    Reason Eval/Treat Not Completed: Other (comment). Pt awake, confused, and combative overnight into this morning--requiring Haldol. Pt finally went to sleep around 6:30 AM. In speaking to RN, will let pt rest for now and check back this PM for appropriateness of therapy evaluations.  Donny BECKER OT Acute Rehabilitation Services Office [phone]    Rodgers Dorothyann Distel 05/02/2024, 9:53 AM

## 2024-05-02 NOTE — Progress Notes (Addendum)
 04:22 Dr. Nelia notified of pt being physically combative with staff repeatedly. Pt ambulated to bathroom per request with multiple RN assist. Pt then tried to close door and hold it closed, refused to leave bathroom. Nearly fell while 3 RN attempted to get pt back to bed with significant resistance. Pt safely returned to bed. Multiple RN and NT have attempted to de-escalate and are unable. Even with mitts, unable to keep nasal cannula on and pt dislodged PIV. Order for soft wrist restraints placed as well as IV haldol. Soft wrist restraints applied. Documentation in flowsheets per policy. Pt continues to be very agitated. De-escalation measures continued, delirium precautions in place.   Dextrose  5% continuous IV infusion order expired @ 03:00 per order. MD contacted regarding continuation of IV fluids given pt NPO status (until cleared by SLP) & CBG maintaining at 72-74 with dextrose  infusion. Per MD, 1L LR bolus ordered and no continuous fluids after bolus is complete.   05:30 Pt finally asleep s/p haldol administration. Pt has not slept at all overnight. Staff present at bedside throughout shift and pt was awake all night until after haldol administration.

## 2024-05-02 NOTE — Progress Notes (Addendum)
 Alerted by nursing staff about worsening agitation, secondary to pain. IV Tylenol  did not help, IV Robaxin  did not help, Lidocaine  patches also not helping. Also reported that she is not producing urine, and had to be in and out cathed in the emergency department.   On my exam, she is extremely uncomfortable, but conversational. She is complaining of right hip pain that radiates down to about her knee. Palpation of her hip joint causes a moderate amount of pain, and her range of motion of that right leg is severely limited to pain. Her mental status appears ok, as she seems alert and orientated. She does have intermittent myoclonic jerking by moving her hands quickly up to her face.   Currently unclear what could be driving her clinical presentation, but will add on a couple labs outlined below. Foley placement produced 350cc of urine output, likely in the setting of AKI on top of chronic urinary issues. It's very clear that this pain is almost unbearable for her, so further imaging and pain meds is warranted. I think her mental status may be back to baseline based off of what's been charted. Appreciate excellent care and attention by nursing staff on the floor.   For her mycolonic jerking, could be in the setting of gabapentin use, and with her AKI it may be building up in her system. EEG is pending, she may need an MRI of her brain for further evaluation, once her pain is controlled.   Plan:  - Place Foley catheter - Recheck CBC, check RFP  - Check CK - Right hip x-ray  - One time dose of IV .5mg  Dilaudid 

## 2024-05-03 ENCOUNTER — Encounter: Payer: Self-pay | Admitting: Internal Medicine

## 2024-05-03 DIAGNOSIS — R410 Disorientation, unspecified: Secondary | ICD-10-CM | POA: Insufficient documentation

## 2024-05-03 DIAGNOSIS — E162 Hypoglycemia, unspecified: Secondary | ICD-10-CM | POA: Insufficient documentation

## 2024-05-03 LAB — COMPREHENSIVE METABOLIC PANEL WITH GFR
ALT: 13 U/L (ref 0–44)
AST: 30 U/L (ref 15–41)
Albumin: 3.1 g/dL — ABNORMAL LOW (ref 3.5–5.0)
Alkaline Phosphatase: 67 U/L (ref 38–126)
Anion gap: 11 (ref 5–15)
BUN: 33 mg/dL — ABNORMAL HIGH (ref 8–23)
CO2: 23 mmol/L (ref 22–32)
Calcium: 7.4 mg/dL — ABNORMAL LOW (ref 8.9–10.3)
Chloride: 106 mmol/L (ref 98–111)
Creatinine, Ser: 2.28 mg/dL — ABNORMAL HIGH (ref 0.44–1.00)
GFR, Estimated: 20 mL/min — ABNORMAL LOW (ref >60)
Glucose, Bld: 125 mg/dL — ABNORMAL HIGH (ref 70–99)
Potassium: 3.4 mmol/L — ABNORMAL LOW (ref 3.5–5.1)
Sodium: 140 mmol/L (ref 135–145)
Total Bilirubin: 0.8 mg/dL (ref 0.0–1.2)
Total Protein: 5.7 g/dL — ABNORMAL LOW (ref 6.5–8.1)

## 2024-05-03 LAB — CBC
HCT: 30 % — ABNORMAL LOW (ref 36.0–46.0)
Hemoglobin: 9.2 g/dL — ABNORMAL LOW (ref 12.0–15.0)
MCH: 29.9 pg (ref 26.0–34.0)
MCHC: 30.7 g/dL (ref 30.0–36.0)
MCV: 97.4 fL (ref 80.0–100.0)
Platelets: 173 K/uL (ref 150–400)
RBC: 3.08 MIL/uL — ABNORMAL LOW (ref 3.87–5.11)
RDW: 14 % (ref 11.5–15.5)
WBC: 7.5 K/uL (ref 4.0–10.5)
nRBC: 0 % (ref 0.0–0.2)

## 2024-05-03 LAB — URINE CULTURE: Culture: NO GROWTH

## 2024-05-03 LAB — GLUCOSE, CAPILLARY
Glucose-Capillary: 100 mg/dL — ABNORMAL HIGH (ref 70–99)
Glucose-Capillary: 112 mg/dL — ABNORMAL HIGH (ref 70–99)
Glucose-Capillary: 142 mg/dL — ABNORMAL HIGH (ref 70–99)
Glucose-Capillary: 147 mg/dL — ABNORMAL HIGH (ref 70–99)
Glucose-Capillary: 76 mg/dL (ref 70–99)
Glucose-Capillary: 78 mg/dL (ref 70–99)
Glucose-Capillary: 95 mg/dL (ref 70–99)

## 2024-05-03 MED ORDER — POTASSIUM CHLORIDE CRYS ER 20 MEQ PO TBCR
40.0000 meq | EXTENDED_RELEASE_TABLET | Freq: Once | ORAL | Status: AC
  Administered 2024-05-03: 40 meq via ORAL
  Filled 2024-05-03: qty 2

## 2024-05-03 MED ORDER — AMLODIPINE BESYLATE 10 MG PO TABS
10.0000 mg | ORAL_TABLET | Freq: Every day | ORAL | Status: DC
  Administered 2024-05-03 – 2024-05-05 (×3): 10 mg via ORAL
  Filled 2024-05-03 (×3): qty 1

## 2024-05-03 MED ORDER — AMLODIPINE BESYLATE 5 MG PO TABS: 2.5000 mg | ORAL_TABLET | Freq: Every day | ORAL | Status: DC

## 2024-05-03 NOTE — Plan of Care (Signed)
   Problem: Coping: Goal: Level of anxiety will decrease Outcome: Progressing   Problem: Elimination: Goal: Will not experience complications related to urinary retention Outcome: Progressing   Problem: Pain Managment: Goal: General experience of comfort will improve and/or be controlled Outcome: Progressing

## 2024-05-03 NOTE — Progress Notes (Cosign Needed Addendum)
 HD#3 SUBJECTIVE:  Patient Summary:  Jean Aguilar is an 88 year old woman with a history of essential hypertension, CKD?, prior CVA/TIA-like episodes (2016 with no residual), Acute on chronic back pain on multiple sedating medications (Tramadol , Gabapentin, Tizanidine), chronic memory impairment (suspected vascular dementia/mild cognitive impairment), and polypharmacy, who presented with acute altered mental status, restlessness, and tremulousness, and is being admitted for acute encephalopathy likely from polypharmacy + AKI on hospital day 3.  Overnight Events: NAEON  Interim History: Patient was sitting up in bed in no acute distress. She was oriented to self, unable to recall the exact hospital name. She remained confused about the events leading to her admission. She denied pain, shortness of breath, or back pain. She repeatedly asked for an explanation regarding what happened to her spine and why she was brought to the hospital.  OBJECTIVE:  Vital Signs: Vitals:   05/02/24 2025 05/02/24 2038 05/03/24 0007 05/03/24 0419  BP: (!) 176/77  (!) 156/50 (!) 153/63  Pulse: 61  (!) 55 64  Resp: 18  17 17   Temp: (!) 100.6 F (38.1 C) 99.3 F (37.4 C) 98 F (36.7 C) 98.1 F (36.7 C)  TempSrc: Oral Oral Oral   SpO2: 100%  100% 100%  Weight:      Height:       Supplemental O2: Room Air SpO2: 100 % O2 Flow Rate (L/min): 2 L/min  Filed Weights   05/01/24 1121  Weight: 83.9 kg     Intake/Output Summary (Last 24 hours) at 05/03/2024 9347 Last data filed at 05/02/2024 2145 Gross per 24 hour  Intake 354 ml  Output 900 ml  Net -546 ml   Net IO Since Admission: -371.2 mL [05/03/24 0652]  Physical Exam: Physical Exam Constitutional: Well-appearing elderly woman, lying in bed, tremulous but in no acute distress. HENT: NCAT, mucous membranes moist. Eyes: Conjunctiva clear; PERRL; EOMI. Neck: Supple. Cardiovascular: RRR, no murmurs. Pulmonary: Normal work of breathing; lungs  clear bilaterally. Abdominal: Soft, non-tender, non-distended. FDX:Wnmfjo bulk and tone in lower ext with no lower-extremity edema. Moderate tenderness over the right greater trochanter. Full range of motion of the lower extremities without pain on external or internal rotation. Neurological: Alert to person, place; able to communicate well,  follows commands; no focal deficits; moves all extremities; tremulous. Skin: Warm and dry. Psych: Pleasant,  confused.   Patient Lines/Drains/Airways Status     Active Line/Drains/Airways     Name Placement date Placement time Site Days   Peripheral IV 05/02/24 22 G Anterior;Distal;Left Wrist 05/02/24  0512  Wrist  1   Urethral Catheter Jean Aguilar, NT3 16 Fr. 05/01/24  --  --  2            Pertinent labs and imaging:      Latest Ref Rng & Units 05/03/2024    4:47 AM 05/02/2024   12:10 AM 05/01/2024   11:45 AM  CBC  WBC 4.0 - 10.5 K/uL 7.5  6.5  8.4   Hemoglobin 12.0 - 15.0 g/dL 9.2  8.3  9.0   Hematocrit 36.0 - 46.0 % 30.0  26.2  29.3   Platelets 150 - 400 K/uL 173  207  243        Latest Ref Rng & Units 05/03/2024    4:47 AM 05/02/2024    1:44 PM 05/02/2024   12:10 AM  CMP  Glucose 70 - 99 mg/dL 874  871  74   BUN 8 - 23 mg/dL 33  38  42  Creatinine 0.44 - 1.00 mg/dL 7.71  7.49  7.23   Sodium 135 - 145 mmol/L 140  141  140   Potassium 3.5 - 5.1 mmol/L 3.4  3.5  3.6   Chloride 98 - 111 mmol/L 106  104  105   CO2 22 - 32 mmol/L 23  22  21    Calcium  8.9 - 10.3 mg/dL 7.4  7.8  7.8   Total Protein 6.5 - 8.1 g/dL 5.7   5.9   Total Bilirubin 0.0 - 1.2 mg/dL 0.8   0.7   Alkaline Phos 38 - 126 U/L 67   69   AST 15 - 41 U/L 30   30   ALT 0 - 44 U/L 13   11     DG CHEST PORT 1 VIEW Result Date: 05/03/2024 EXAM: 1 VIEW(S) XRAY OF THE CHEST 05/02/2024 07:32:00 PM COMPARISON: 05/01/2024 CLINICAL HISTORY: Shortness of breath FINDINGS: LUNGS AND PLEURA: Pulmonary vascular congestion. No focal pulmonary opacity. No pleural effusion. No  pneumothorax. HEART AND MEDIASTINUM: Unchanged, enlarged cardiomediastinal silhouette. Atherosclerotic calcifications. BONES AND SOFT TISSUES: Right axillary surgical clips noted. No acute osseous abnormality. IMPRESSION: 1. Cardiomegaly and pulmonary vascular congestion. Electronically signed by: Norman Gatlin MD 05/03/2024 12:45 AM EST RP Workstation: [id number]   EEG adult Result Date: 05/02/2024 Michaela Aisha SQUIBB, MD     05/02/2024  3:55 PM History: 88 year old female being evaluated for encephalopathy, EEG to rule out EEG Duration: 23 minutes Sedation: none Patient State: Awake and drowsy Technique: This EEG was acquired with electrodes placed according to the International 10-20 electrode system (including Fp1, Fp2, F3, F4, C3, C4, P3, P4, O1, O2, T3, T4, T5, T6, A1, A2, Fz, Cz, Pz). The following electrodes were missing or displaced: none. Background: The background consists of intermixed alpha and beta activities. There is a well defined posterior dominant rhythm of 8-9 Hz that attenuates with eye opening.  There is also sleep is not recorded. Photic stimulation: Physiologic driving is not performed EEG Abnormalities: 1) mild generalized irregular slow activity Clinical Interpretation: This EEG is consistent with a mild generalized nonspecific cerebral dysfunction (encephalopathy). There was no seizure or seizure predisposition recorded on this study. Please note that lack of epileptiform activity on EEG does not preclude the possibility of epilepsy. Aisha Michaela, MD [facility] If 7pm- 7am, please page neurology on call as listed in AMION.   ASSESSMENT/PLAN:  Assessment: Principal Problem:   AMS (altered mental status) Active Problems:   HTN (hypertension)   CKD (chronic kidney disease)   Plan: Jean Aguilar is an 88 year old woman with a history of essential hypertension, CKD?, prior CVA/TIA-like episodes (2016 with no residual), Acute on chronic back pain on  multiple sedating medications (Tramadol , Gabapentin, Tizanidine), chronic memory impairment (suspected vascular dementia/mild cognitive impairment), and polypharmacy, who presented with acute altered mental status, restlessness, and tremulousness, and is being admitted for acute encephalopathy likely from polypharmacy + AKI on hospital day 3.   #Acute Encephalopathy Improving. Likely multifactorial: polypharmacy (gabapentin, tizanidine, tramadol ), AKI impairing clearance, underlying cognitive impairment, possible dehydration, and urinary retention. CT head negative; no focal deficits. UA without clear infection.  Interictal EEG: Normal. Plan: - Hold gabapentin, tizanidine, tramadol  - Delirium precautions; frequent reorientation - Soil scientist - Neuro checks q4h - Foley Catheter  - Avoid sedatives/anticholinergics - PT/OT/CSW for safety and discharge planning  # AKI vs CKD Progression Improving. Cr: 2.2 down from 3.1; previous known Cr 1.4 (2016). Likely prerenal from poor PO intake vs progression of CKD;  post-renal possible given retention.Renal ultrasound did not report any hydronephrosis. Plan: - Trend BMP - Foley Cath - Gentle IV fluids if needed - Avoid nephrotoxins - Hold lisinopril  and HCTZ - Monitor urine output/bladder scans   #Chronic Back Pain and Acute Right Hip Pain #Greater Trochanteric Tendinitis Patient with chronic back pain now has new, severe right hip pain radiating to the knee. This limits range of motion and contributes to agitation. X-ray shows no acute fracture or dislocation, only degenerative changes. Pain is likely multifactorial from degenerative joint disease, muscle spasm, and possible radicular pain, worsened by immobility and delirium. Sedating medications held due to encephalopathy and AKI. Presentation is most consistent with acute greater trochanteric tendinitis. Plan: - Scheduled acetaminophen  650 QID - Topical lidocaine  patches - Avoid gabapentin,  tizanidine, tramadol  (held) - PT/OT evaluation once agitation improves - Heat packs and repositioning for comfort - Avoid NSAIDs given AKI  # Hypoglycemia Resolved.  Patient experienced hypoglycemia (BG 50-60) in the setting of NPO status, corrected with two doses of D50. SLP evaluated the patient and initiated a dysphagia diet. plan: - Continue regular blood glucose monitoring  #Essential Hypertension Home regimen: hydralazine , losartan/HCTZ, amlodipine .  Started amlodipine , based on her kidney function.  Plan: - DC hydralazine  - Start Amlodipine  10  mg daily - Hold losartan/HCTZ in setting of AKI  #Hyperlipidemia - Continue On Crestor  20 mg.  #Chronic Memory Impairment On memantine and Aricept per son's report. Plan: - Continue memantine - Hold donepezil (HR:50-60)  #Normocytic Anemia Hgb 8.f from 9.0; new from prior.B12 is normal, maybe dilutional.  Plan: - Iron studies, ferritin - Trend CBC  #Chronic Bilateral LE Edema Reported for months; BNP elevated but exam not volume overloaded. Plan: - Leg elevation - Compression as tolerated - Avoid diuretics during AKI  #Safety / Disposition Concerns Lives alone; poor medication adherence; son concerned for cognitive decline. Plan: - PT/OT/CSW evaluations: SNF - Discuss need for supervised setting vs home health - Medication reconciliation when stable   Best practice: Diet: Dysphagia diet  VTE: Heparin  IVF: None,None Code: Full   Disposition planning: Prior to Admission Living Arrangement: Home, living alone Anticipated Discharge Location: Home   Dispo: Admit patient to Observation with expected length of stay less than 2 midnights. Signature:  Georgene Kopper [facility]ad[facility]  6:52 AM, 05/03/2024  On Call pager [phone]

## 2024-05-03 NOTE — Progress Notes (Signed)
 Speech Language Pathology Treatment: Dysphagia  Patient Details Name: Jean Aguilar MRN: [id number] DOB: June 20, 1935 Today's Date: 05/03/2024 Time: 9167-9151 SLP Time Calculation (min) (ACUTE ONLY): 16 min  Assessment / Plan / Recommendation Clinical Impression  PLAN: Continue mechanical soft diet/thin liquids. Pt protecting airway and feeding herself now that MS has improved. No further SLP f/u needed.   Pt with much improved mental status, allowing her to feed herself appropriately. Able to navigate around her breakfast tray and manage utensils easily.  Mastication is thorough. There are no further concerns for aspiration. There was no coughing while eating breakfast.  Continue current diet. No SLP f/u is needed. Will sign off.[name]   HPI HPI: Pt is an 88 y.o. female who presented from home on 11/29 with acute altered mental status and chronic lower-extremity swelling. Pt failed [facility] swallow screen 11/29 due to coughing. PMH: hypertension, CKD?, prior CVA, chronic back pain, and baseline cognitive impairment (on donepezil in the past)      SLP Plan  All goals met          Recommendations  Diet recommendations: Dysphagia 3 (mechanical soft);Thin liquid Liquids provided via: Cup;Straw Medication Administration: Whole meds with puree Supervision: Patient able to self feed Postural Changes and/or Swallow Maneuvers: Seated upright 90 degrees                  Oral care BID     Dysphagia, unspecified (R13.10)     All goals met    [name] L. Vona, MA CCC/SLP Clinical Specialist - Acute Care SLP Acute Rehabilitation Services Office number [phone]  [name]  05/03/2024, 8:51 AM

## 2024-05-03 NOTE — TOC Initial Note (Signed)
 Transition of Care [facility]) - Initial/Assessment Note    Patient Details  Name: Jean Aguilar MRN: [id number] Date of Birth: 1935-10-06  Transition of [facility]) CM/SW Contact:    Sherline Clack, LCSWA Phone Number: 05/03/2024, 4:34 PM  Clinical Narrative:                  CSW filed for patient's PASSR for SNF placement. PASSR currently pending. CSW will continue SNF referral process once PASSR is confirmed.   Expected Discharge Plan: Skilled Nursing Facility Barriers to Discharge: Continued Medical Work Up, PASSR       Patient Goals and CMS Choice            Expected Discharge Plan and Services                                              Prior Living Arrangements/Services                       Activities of Daily Living   ADL Screening (condition at time of admission) Independently performs ADLs?: Yes (appropriate for developmental age) Is the patient deaf or have difficulty hearing?: No Does the patient have difficulty seeing, even when wearing glasses/contacts?: Yes Does the patient have difficulty concentrating, remembering, or making decisions?: Yes  Permission Sought/Granted                  Emotional Assessment              Admission diagnosis:  Disorientation [R41.0] AKI (acute kidney injury) [N17.9] Anemia, unspecified type [D64.9] AMS (altered mental status) [R41.82] Patient Active Problem List   Diagnosis Date Noted   Delirium, drug-induced 05/03/2024   Alimentary hypoglycemia 05/03/2024   Cerebral infarction (HCC) 12/12/2014   HTN (hypertension) 12/12/2014   CKD (chronic kidney disease) 12/12/2014   Gait difficulty 12/12/2014   Acute kidney injury superimposed on chronic kidney disease 12/12/2014   Vascular dementia (HCC) 10/27/2014   Multiple fractures of ribs of right side 01/15/2013   PCP:  [facility] [employer] Pharmacy:   [facility] [phone] - [place], [place] - [address] AT  [facility] [address] [place] [address] Phone: [phone] Fax: [phone]     Social Drivers of Health (SDOH) Social History: SDOH Screenings   Food Insecurity: No Food Insecurity (05/01/2024)  Housing: Low Risk  (05/01/2024)  Transportation Needs: No Transportation Needs (05/01/2024)  Utilities: Not At Risk (05/01/2024)  Social Connections: Moderately Integrated (05/01/2024)  Tobacco Use: High Risk (05/01/2024)   SDOH Interventions:     Readmission Risk Interventions     No data to display

## 2024-05-04 ENCOUNTER — Other Ambulatory Visit (HOSPITAL_COMMUNITY): Payer: Self-pay

## 2024-05-04 ENCOUNTER — Telehealth (HOSPITAL_COMMUNITY): Payer: Self-pay | Admitting: Pharmacy Technician

## 2024-05-04 LAB — GLUCOSE, CAPILLARY
Glucose-Capillary: 103 mg/dL — ABNORMAL HIGH (ref 70–99)
Glucose-Capillary: 92 mg/dL (ref 70–99)
Glucose-Capillary: 115 mg/dL — ABNORMAL HIGH (ref 70–99)
Glucose-Capillary: 100 mg/dL — ABNORMAL HIGH (ref 70–99)
Glucose-Capillary: 140 mg/dL — ABNORMAL HIGH (ref 70–99)

## 2024-05-04 LAB — CBC
HCT: 29.8 % — ABNORMAL LOW (ref 36.0–46.0)
Hemoglobin: 9.7 g/dL — ABNORMAL LOW (ref 12.0–15.0)
MCH: 30.9 pg (ref 26.0–34.0)
MCHC: 32.6 g/dL (ref 30.0–36.0)
MCV: 94.9 fL (ref 80.0–100.0)
Platelets: 214 K/uL (ref 150–400)
RBC: 3.14 MIL/uL — ABNORMAL LOW (ref 3.87–5.11)
RDW: 13.9 % (ref 11.5–15.5)
WBC: 8.7 K/uL (ref 4.0–10.5)
nRBC: 0 % (ref 0.0–0.2)

## 2024-05-04 LAB — COMPREHENSIVE METABOLIC PANEL WITH GFR
ALT: 18 U/L (ref 0–44)
AST: 27 U/L (ref 15–41)
Albumin: 3 g/dL — ABNORMAL LOW (ref 3.5–5.0)
Alkaline Phosphatase: 71 U/L (ref 38–126)
Anion gap: 8 (ref 5–15)
BUN: 29 mg/dL — ABNORMAL HIGH (ref 8–23)
CO2: 27 mmol/L (ref 22–32)
Calcium: 7.6 mg/dL — ABNORMAL LOW (ref 8.9–10.3)
Chloride: 107 mmol/L (ref 98–111)
Creatinine, Ser: 2.17 mg/dL — ABNORMAL HIGH (ref 0.44–1.00)
GFR, Estimated: 22 mL/min — ABNORMAL LOW (ref >60)
Glucose, Bld: 119 mg/dL — ABNORMAL HIGH (ref 70–99)
Potassium: 3.2 mmol/L — ABNORMAL LOW (ref 3.5–5.1)
Sodium: 142 mmol/L (ref 135–145)
Total Bilirubin: 0.8 mg/dL (ref 0.0–1.2)
Total Protein: 5.8 g/dL — ABNORMAL LOW (ref 6.5–8.1)

## 2024-05-04 MED ORDER — SPIRONOLACTONE 12.5 MG HALF TABLET
12.5000 mg | ORAL_TABLET | Freq: Every day | ORAL | Status: DC
  Administered 2024-05-04 – 2024-05-05 (×2): 12.5 mg via ORAL
  Filled 2024-05-04 (×3): qty 1

## 2024-05-04 MED ORDER — POTASSIUM CHLORIDE CRYS ER 20 MEQ PO TBCR
40.0000 meq | EXTENDED_RELEASE_TABLET | Freq: Two times a day (BID) | ORAL | Status: AC
  Administered 2024-05-04 (×2): 40 meq via ORAL
  Filled 2024-05-04 (×2): qty 2

## 2024-05-04 MED ORDER — RIVAROXABAN 10 MG PO TABS
10.0000 mg | ORAL_TABLET | Freq: Every day | ORAL | Status: DC
  Administered 2024-05-04 – 2024-05-05 (×2): 10 mg via ORAL
  Filled 2024-05-04 (×2): qty 1

## 2024-05-04 NOTE — Plan of Care (Signed)

## 2024-05-04 NOTE — Telephone Encounter (Signed)
 Patient Product/process Development Scientist completed.    The patient is insured through [facility]. Patient has Medicare and is not eligible for a copay card, but may be able to apply for patient assistance or Medicare RX Payment Plan (Patient Must reach out to their plan, if eligible for payment plan), if available.    Ran test claim for Xarelto 10 mg and the current 30 day co-pay is $302.00 due to a $255.00 deductible.   This test claim was processed through [facility]- copay amounts may vary at other pharmacies due to pharmacy/plan contracts, or as the patient moves through the different stages of their insurance plan.     Reyes Sharps, CPHT Pharmacy Technician Patient Advocate Specialist Lead [facility] Patient Advocate Team Direct Number: [phone]  Fax: [phone]

## 2024-05-04 NOTE — Progress Notes (Signed)
 Physical Therapy Treatment Patient Details Name: Jean Aguilar MRN: [id number] DOB: 21-Dec-1935 Today's Date: 05/04/2024   History of Present Illness 88 y.o. female [facility] to [facility]/01/24 with AMS and chronic LE swelling. CT head negative. Admitted with acute encephalopathy and AKI. PMHx: hypertension, CKD?, prior CVA, chronic back pain, T2DM, fibromyalgia, COPD/asthma, carotid disease and baseline cognitive impairment (on donepezil in the past)    PT Comments  Pt received in bed, agreeable to participation in therapy. Pt assisted to from bathroom for BM prior to further ambulation in room/hallway. She required min assist bed mobility, mod assist transfers, and min assist amb 30' x 2 with RW. VSS on RA. Pt in recliner with feet elevated at end of session. Current POC remains appropriate.     If plan is discharge home, recommend the following: A lot of help with walking and/or transfers;A lot of help with bathing/dressing/bathroom;Assist for transportation;Help with stairs or ramp for entrance   Can travel by private vehicle     Yes  Equipment Recommendations  None recommended by PT    Recommendations for Other Services       Precautions / Restrictions Precautions Precautions: Fall Recall of Precautions/Restrictions: Impaired     Mobility  Bed Mobility Overal bed mobility: Needs Assistance Bed Mobility: Supine to Sit     Supine to sit: Min assist, HOB elevated, Used rails     General bed mobility comments: increased time, cues for sequencing    Transfers Overall transfer level: Needs assistance Equipment used: Rolling walker (2 wheels) Transfers: Sit to/from Stand Sit to Stand: Mod assist           General transfer comment: cues for hand placement and sequencing, assist to power up and stabilize balance    Ambulation/Gait Ambulation/Gait assistance: Min assist Gait Distance (Feet): 30 Feet (x 2) Assistive device: Rolling walker (2 wheels) Gait  Pattern/deviations: Drifts right/left, Step-through pattern, Decreased stride length, Trunk flexed Gait velocity: decreased Gait velocity interpretation: <1.31 ft/sec, indicative of household ambulator   General Gait Details: cues for proximity to RW. Assist with RW management and balance   Stairs             Wheelchair Mobility     Tilt Bed    Modified Rankin (Stroke Patients Only)       Balance Overall balance assessment: Needs assistance Sitting-balance support: No upper extremity supported, Feet supported Sitting balance-Leahy Scale: Fair     Standing balance support: Bilateral upper extremity supported, During functional activity, Reliant on assistive device for balance Standing balance-Leahy Scale: Poor                              Communication Communication Communication: No apparent difficulties  Cognition Arousal: Alert Behavior During Therapy: WFL for tasks assessed/performed   PT - Cognitive impairments: Orientation, Awareness, Memory, Attention, Sequencing, Problem solving, Safety/Judgement, Initiation   Orientation impairments: Situation, Time                     Following commands: Impaired Following commands impaired: Follows one step commands with increased time    Cueing Cueing Techniques: Verbal cues, Tactile cues  Exercises      General Comments General comments (skin integrity, edema, etc.): VSS on RA      Pertinent Vitals/Pain Pain Assessment Pain Assessment: No/denies pain    Home Living  Prior Function            PT Goals (current goals can now be found in the care plan section) Acute Rehab PT Goals Patient Stated Goal: home Progress towards PT goals: Progressing toward goals    Frequency    Min 2X/week      PT Plan      Co-evaluation              AM-PAC PT 6 Clicks Mobility   Outcome Measure  Help needed turning from your back to your side  while in a flat bed without using bedrails?: A Little Help needed moving from lying on your back to sitting on the side of a flat bed without using bedrails?: A Lot Help needed moving to and from a bed to a chair (including a wheelchair)?: A Lot Help needed standing up from a chair using your arms (e.g., wheelchair or bedside chair)?: A Lot Help needed to walk in hospital room?: A Little Help needed climbing 3-5 steps with a railing? : A Lot 6 Click Score: 14    End of Session Equipment Utilized During Treatment: Gait belt Activity Tolerance: Patient tolerated treatment well Patient left: in chair;with call bell/phone within reach;with chair alarm set Nurse Communication: Mobility status PT Visit Diagnosis: Unsteadiness on feet (R26.81);Other abnormalities of gait and mobility (R26.89);Muscle weakness (generalized) (M62.81)     Time: 9241-9177 PT Time Calculation (min) (ACUTE ONLY): 24 min  Charges:    $Gait Training: 8-22 mins $Therapeutic Activity: 8-22 mins PT General Charges $$ ACUTE PT VISIT: 1 Visit                     Sari MATSU., PT  Office # [phone]    Erven Sari Shaker 05/04/2024, 9:00 AM

## 2024-05-04 NOTE — Progress Notes (Addendum)
 HD#2 SUBJECTIVE:  Patient Summary: Jean Aguilar is an 88 year old woman with a history of essential hypertension, prior CVA/TIA-like episodes (2016 with no residual except for cognitive decline), acute on chronic back pain for which she received recent prescription for multiple sedating medications (Tramadol , Gabapentin, Tizanidine), chronic memory impairment (suspected vascular dementia/mild cognitive impairment),  who presented with acute altered mental status, restlessness, and tremulousness, and is being admitted for acute encephalopathy from polypharmacy + AKI on hospital day 3.   Overnight Events:NAEON  Interim History: Patient was sitting upright in a chair in no acute distress. She was oriented to self and repeatedly asked to go home. I explained the recommendation for SNF placement, but she continues to prefer home. I spoke with her son, who saw her yesterday; he feels she is at her usual baseline and wants to respect his mother's wishes. She denies pain, shortness of breath, or back pain.  OBJECTIVE:  Vital Signs: Vitals:   05/04/24 1621 05/04/24 1633 05/04/24 1825 05/04/24 1947  BP: (!) 184/79 (!) 196/86 (!) 174/76 (!) 184/67  Pulse: 72   70  Resp: 20     Temp: 99.1 F (37.3 C)   98.4 F (36.9 C)  TempSrc: Oral     SpO2: 99%   100%  Weight:      Height:       Supplemental O2: Room Air SpO2: 100 % O2 Flow Rate (L/min): 2 L/min  Filed Weights   05/01/24 1121  Weight: 185 lb (83.9 kg)     Intake/Output Summary (Last 24 hours) at 05/04/2024 2038 Last data filed at 05/04/2024 1540 Gross per 24 hour  Intake 236 ml  Output 3025 ml  Net -2789 ml   Net IO Since Admission: -3,090.2 mL [05/04/24 2038]  Physical Exam: Physical Exam Constitutional: Well-appearing elderly woman, lying in bed, tremulous but in no acute distress. HENT: NCAT, mucous membranes moist. Eyes: Conjunctiva clear; PERRL; EOMI. Neck: Supple. Cardiovascular: RRR, no murmurs. Pulmonary:  Normal work of breathing; lungs clear bilaterally. Abdominal: Soft, non-tender, non-distended. FDX:Wnmfjo bulk and tone in lower ext with no lower-extremity edema. Moderate tenderness over the right greater trochanter. Full range of motion of the lower extremities without pain on external or internal rotation. Neurological: Alert to person, place; able to communicate well,  follows commands; no focal deficits; moves all extremities; tremulous. Skin: Warm and dry. Psych: Pleasant Patient Lines/Drains/Airways Status     Active Line/Drains/Airways     Name Placement date Placement time Site Days   Peripheral IV 05/02/24 22 G Anterior;Distal;Left Wrist 05/02/24  0512  Wrist  2   Urethral Catheter Hiromy, NT3 16 Fr. 05/01/24  --  --  3            Pertinent labs and imaging:      Latest Ref Rng & Units 05/04/2024    1:29 AM 05/03/2024    4:47 AM 05/02/2024   12:10 AM  CBC  WBC 4.0 - 10.5 K/uL 8.7  7.5  6.5   Hemoglobin 12.0 - 15.0 g/dL 9.7  9.2  8.3   Hematocrit 36.0 - 46.0 % 29.8  30.0  26.2   Platelets 150 - 400 K/uL 214  173  207        Latest Ref Rng & Units 05/04/2024    1:29 AM 05/03/2024    4:47 AM 05/02/2024    1:44 PM  CMP  Glucose 70 - 99 mg/dL 880  874  871   BUN 8 - 23 mg/dL  29  33  38   Creatinine 0.44 - 1.00 mg/dL 7.82  7.71  7.49   Sodium 135 - 145 mmol/L 142  140  141   Potassium 3.5 - 5.1 mmol/L 3.2  3.4  3.5   Chloride 98 - 111 mmol/L 107  106  104   CO2 22 - 32 mmol/L 27  23  22    Calcium  8.9 - 10.3 mg/dL 7.6  7.4  7.8   Total Protein 6.5 - 8.1 g/dL 5.8  5.7    Total Bilirubin 0.0 - 1.2 mg/dL 0.8  0.8    Alkaline Phos 38 - 126 U/L 71  67    AST 15 - 41 U/L 27  30    ALT 0 - 44 U/L 18  13      No results found.  ASSESSMENT/PLAN:  Assessment: Principal Problem:   Delirium, drug-induced Active Problems:   Vascular dementia (HCC)   HTN (hypertension)   Acute kidney injury   Alimentary hypoglycemia   Plan: Jean Aguilar is an 88 year old  woman with a history of essential hypertension, prior CVA/TIA-like episodes (2016 with no residual), Acute on chronic back pain for which she was recently prescribed multiple sedating medications (Tramadol , Gabapentin, Tizanidine), chronic memory impairment (suspected vascular dementia/mild cognitive impairment), and polypharmacy, who presented with acute altered mental status, restlessness, and tremulousness, and is being admitted for acute encephalopathy likely from polypharmacy + AKI on hospital day 3.   #Acute Encephalopathy Improving. Likely multifactorial: polypharmacy (gabapentin, tizanidine, tramadol ), AKI impairing clearance, underlying cognitive impairment, possible dehydration, and urinary retention. CT head negative; no focal deficits. UA without clear infection.  Interictal EEG: Normal. Plan: - Do not resume gabapentin, tizanidine, tramadol  - Delirium precautions; frequent reorientation - Soil scientist - Neuro checks q4h - Avoid sedatives/anticholinergics - PT/OT/CSW for safety and discharge planning  # AKI   Improving. Cr: 2.1 down from 2.3; previous known Cr 1.4 (2016). Likely prerenal from poor PO intake vs progression of CKD (we have not labs from prior to hospitalization); post-renal less likely as ultrasound did not report any hydronephrosis. Plan: - Trend BMP - Foley Cath- will discontinue and assess voiding ability - Gentle IV fluids if needed - Avoid nephrotoxins - Hold lisinopril  and HCTZ - Monitor urine output/bladder scans   #Chronic Back Pain and Acute Right Hip Pain #Greater Trochanteric Bursitis Patient with chronic back pain now has new, severe right hip pain radiating to the knee. This limits range of motion and contributes to agitation. X-ray shows no acute fracture or dislocation, only degenerative changes. Pain is likely multifactorial from degenerative joint disease, muscle spasm, and possible radicular pain, worsened by immobility and delirium. Sedating  medications held due to encephalopathy and AKI. Presentation is most consistent with acute greater trochanteric tendinitis. Plan: - Scheduled acetaminophen  650 QID - Topical lidocaine  patches - Avoid gabapentin, tizanidine, tramadol  (held) - PT/OT evaluation once agitation improves - Heat packs and repositioning for comfort - Avoid NSAIDs given AKI   # Hypoglycemia Resolved.  Patient experienced hypoglycemia (BG 50-60) in the setting of NPO status, corrected with two doses of D50. SLP evaluated the patient and initiated a dysphagia diet. plan: - Continue regular blood glucose monitoring  #Essential Hypertension Home regimen: hydralazine , losartan/HCTZ, amlodipine .  Started amlodipine , based on her kidney function.  Plan: - DC hydralazine  - Continue Amlodipine  10  mg daily - Start Spironolactone    - DC losartan/HCTZ in setting of AKI  #Hyperlipidemia - Continue On Crestor  20 mg.  #Chronic Memory  Impairment On memantine and Aricept per son's report. Plan: - Continue memantine - Hold donepezil due to bradycardia (HR:50-60)  #Normocytic Anemia Hgb 8.f from 9.0; new from prior.B12 is normal, maybe dilutional.  Plan: - Iron studies, ferritin - Trend CBC  #Chronic Bilateral LE Edema Reported for months; BNP elevated but exam not volume overloaded. Plan: - Leg elevation - Compression as tolerated - Avoid diuretics during AKI  #Safety / Disposition Concerns Lives alone; poor medication adherence; son concerned for cognitive decline. Plan: - PT/OT/CSW evaluations: SNF - Discuss need for supervised setting vs home health - Medication reconciliation when stable   Best practice: Diet: Dysphagia diet  VTE: Heparin  IVF: None,None Code: Full   Disposition planning: Prior to Admission Living Arrangement: Home, living alone Anticipated Discharge Location: Home   Dispo: Admit patient to Observation with expected length of stay less than 2 midnights. Signature: Mliss Arlean Trud[facility]  8:38 PM, 05/04/2024  On Call pager [phone]

## 2024-05-04 NOTE — Plan of Care (Signed)

## 2024-05-05 ENCOUNTER — Other Ambulatory Visit (HOSPITAL_COMMUNITY): Payer: Self-pay

## 2024-05-05 LAB — CBC
HCT: 31.1 % — ABNORMAL LOW (ref 36.0–46.0)
Hemoglobin: 10.1 g/dL — ABNORMAL LOW (ref 12.0–15.0)
MCH: 30.2 pg (ref 26.0–34.0)
MCHC: 32.5 g/dL (ref 30.0–36.0)
MCV: 93.1 fL (ref 80.0–100.0)
Platelets: 259 K/uL (ref 150–400)
RBC: 3.34 MIL/uL — ABNORMAL LOW (ref 3.87–5.11)
RDW: 13.9 % (ref 11.5–15.5)
WBC: 5.5 K/uL (ref 4.0–10.5)
nRBC: 0 % (ref 0.0–0.2)

## 2024-05-05 LAB — BASIC METABOLIC PANEL WITH GFR
Anion gap: 9 (ref 5–15)
BUN: 15 mg/dL (ref 8–23)
CO2: 25 mmol/L (ref 22–32)
Calcium: 8.1 mg/dL — ABNORMAL LOW (ref 8.9–10.3)
Chloride: 109 mmol/L (ref 98–111)
Creatinine, Ser: 1.72 mg/dL — ABNORMAL HIGH (ref 0.44–1.00)
GFR, Estimated: 29 mL/min — ABNORMAL LOW (ref >60)
Glucose, Bld: 102 mg/dL — ABNORMAL HIGH (ref 70–99)
Potassium: 3.6 mmol/L (ref 3.5–5.1)
Sodium: 143 mmol/L (ref 135–145)

## 2024-05-05 LAB — GLUCOSE, CAPILLARY
Glucose-Capillary: 122 mg/dL — ABNORMAL HIGH (ref 70–99)
Glucose-Capillary: 100 mg/dL — ABNORMAL HIGH (ref 70–99)

## 2024-05-05 MED ORDER — AMLODIPINE BESYLATE 10 MG PO TABS
10.0000 mg | ORAL_TABLET | Freq: Every day | ORAL | 0 refills | Status: AC
  Filled 2024-05-05: qty 45, 45d supply, fill #0
  Filled 2024-05-05: qty 30, 30d supply, fill #0

## 2024-05-05 NOTE — Progress Notes (Signed)
 Occupational Therapy Treatment Patient Details Name: RAYLEEN WYRICK MRN: [id number] DOB: [birth year] Today's Date: 05/05/2024   History of present illness 88 y.o. female presents to [facility] 05/01/24 with AMS and chronic LE swelling. CT head negative. Admitted with acute encephalopathy and AKI. PMHx: hypertension, CKD?, prior CVA, chronic back pain, T2DM, fibromyalgia, COPD/asthma, carotid disease and baseline cognitive impairment (on donepezil in the past)   OT comments  Pt greeted supine in bed, pt states she really wants to be lazy but eventually agreeable to session with encouragement. Session focused on improving overall activity tolerance for ADL participation. Pt currently requires CGA for ADL transfers with RW. Pt able to walk to recliner in room and back to bed but then reports, that's all I can do. Patient will benefit from continued inpatient follow up therapy, <3 hours/day       If plan is discharge home, recommend the following:  A little help with walking and/or transfers;A lot of help with bathing/dressing/bathroom;Assistance with cooking/housework;Assist for transportation;Help with stairs or ramp for entrance;Direct supervision/assist for medications management;Direct supervision/assist for financial management   Equipment Recommendations  Other (comment) (TBD)    Recommendations for Other Services      Precautions / Restrictions Precautions Precautions: Fall Restrictions Weight Bearing Restrictions Per Provider Order: No       Mobility Bed Mobility Overal bed mobility: Needs Assistance Bed Mobility: Supine to Sit     Supine to sit: Contact guard, Used rails     General bed mobility comments: increased time, cues for sequencing    Transfers Overall transfer level: Needs assistance Equipment used: Rolling walker (2 wheels) Transfers: Sit to/from Stand Sit to Stand: Min assist, Contact guard assist           General transfer comment: MIN A to rise  from EOB but progressed to CGA from recliner, cues for hand placement     Balance Overall balance assessment: Needs assistance Sitting-balance support: No upper extremity supported, Feet supported Sitting balance-Leahy Scale: Fair     Standing balance support: Bilateral upper extremity supported, During functional activity, Reliant on assistive device for balance Standing balance-Leahy Scale: Poor                             ADL either performed or assessed with clinical judgement   ADL Overall ADL's : Needs assistance/impaired                         Toilet Transfer: Contact guard assist;Rolling walker (2 wheels);Ambulation Toilet Transfer Details (indicate cue type and reason): simulated via functional mobility in the room         Functional mobility during ADLs: Contact guard assist;Rolling walker (2 wheels) General ADL Comments: ADL participation impacted by decreased activity tolerance    Extremity/Trunk Assessment Upper Extremity Assessment Upper Extremity Assessment: Overall WFL for tasks assessed   Lower Extremity Assessment Lower Extremity Assessment: Generalized weakness   Cervical / Trunk Assessment Cervical / Trunk Assessment: Normal    Vision Patient Visual Report: No change from baseline     Perception     Praxis     Communication Communication Communication: No apparent difficulties   Cognition Arousal: Alert Behavior During Therapy: WFL for tasks assessed/performed Cognition: Cognition impaired     Awareness: Online awareness intact, Intellectual awareness impaired Memory impairment (select all impairments): Declarative long-term memory Attention impairment (select first level of impairment): Sustained attention Executive functioning impairment (select  all impairments): Initiation, Sequencing, Problem solving OT - Cognition Comments: pt very pleasant but lacks insight into deficits and wants to be lazy impaired attention  to task and noted difficulties with higher level problem solving                 Following commands: Intact        Cueing   Cueing Techniques: Verbal cues  Exercises      Shoulder Instructions       General Comments      Pertinent Vitals/ Pain       Pain Assessment Pain Assessment: No/denies pain  Home Living                                          Prior Functioning/Environment              Frequency  Min 2X/week        Progress Toward Goals  OT Goals(current goals can now be found in the care plan section)  Progress towards OT goals: Progressing toward goals  Acute Rehab OT Goals Patient Stated Goal: to be lazy OT Goal Formulation: With patient Time For Goal Achievement: 05/16/24 Potential to Achieve Goals: Good  Plan      Co-evaluation                 AM-PAC OT 6 Clicks Daily Activity     Outcome Measure   Help from another person eating meals?: None Help from another person taking care of personal grooming?: A Little Help from another person toileting, which includes using toliet, bedpan, or urinal?: A Little Help from another person bathing (including washing, rinsing, drying)?: A Little Help from another person to put on and taking off regular upper body clothing?: None Help from another person to put on and taking off regular lower body clothing?: A Little 6 Click Score: 20    End of Session Equipment Utilized During Treatment: Gait belt;Rolling walker (2 wheels)  OT Visit Diagnosis: Unsteadiness on feet (R26.81);Other abnormalities of gait and mobility (R26.89);Muscle weakness (generalized) (M62.81);Other symptoms and signs involving cognitive function;Pain   Activity Tolerance Patient tolerated treatment well   Patient Left in bed;with call bell/phone within reach;with bed alarm set   Nurse Communication Mobility status        Time: 1030-1041 OT Time Calculation (min): 11 min  Charges: OT  General Charges $OT Visit: 1 Visit OT Treatments $Self Care/Home Management : 8-22 mins  Ronal Mallie POUR., COTA/L Acute Rehabilitation Services [phone]   Ronal Mallie Needy 05/05/2024, 1:29 PM

## 2024-05-05 NOTE — Care Management Important Message (Signed)
 Important Message  Patient Details  Name: Jean Aguilar MRN: [id number] Date of Birth: [birth year]   Important Message Given:  Yes - Medicare IM     Claretta Deed 05/05/2024, 10:16 AM

## 2024-05-05 NOTE — Care Management Important Message (Signed)
 Important Message  Patient Details  Name: Jean Aguilar MRN: [id number] Date of Birth: [birth year]   Important Message Given:  Yes - Medicare IM     Claretta Deed 05/05/2024, 2:47 PM

## 2024-05-05 NOTE — Plan of Care (Signed)

## 2024-05-05 NOTE — Plan of Care (Signed)

## 2024-05-05 NOTE — Discharge Summary (Incomplete)
 Name: Jean Aguilar MRN: [id number] DOB: 1936-01-27 88 y.o. PCP: [facility], [address]  Date of Admission: 05/01/2024 11:13 AM Date of Discharge: 05/05/2024 Attending Physician: Dr. Mliss Pouch  Discharge Diagnosis: 1. Principal Problem:   Delirium, drug-induced Active Problems:   Vascular dementia (HCC)   HTN (hypertension)   Acute kidney injury   Alimentary hypoglycemia   Discharge Medications: Allergies as of 05/05/2024       Reactions   Cephalexin Rash   Lactose Intolerance (gi) Other (See Comments)   Morphine And Codeine  Other (See Comments)   headache   Penicillins Swelling, Other (See Comments)   Has patient had a PCN reaction causing immediate rash, facial/tongue/throat swelling, SOB or lightheadedness with hypotension: No Has patient had a PCN reaction causing severe rash involving mucus membranes or skin necrosis: No Has patient had a PCN reaction that required hospitalization No Has patient had a PCN reaction occurring within the last 10 years: No If all of the above answers are NO, then may proceed with Cephalosporin use.   Tuberculin Tests Other (See Comments)   Reaction at site, using chest x-rays instead        Medication List     STOP taking these medications    donepezil 10 MG tablet Commonly known as: ARICEPT   gabapentin 300 MG capsule Commonly known as: NEURONTIN   ibuprofen  200 MG tablet Commonly known as: ADVIL    losartan-hydrochlorothiazide  50-12.5 MG tablet Commonly known as: HYZAAR   memantine 10 MG tablet Commonly known as: NAMENDA   ProAir  HFA 108 (90 Base) MCG/ACT inhaler Generic drug: albuterol    rosuvastatin  20 MG tablet Commonly known as: CRESTOR    tiZANidine 2 MG tablet Commonly known as: ZANAFLEX   traMADol  50 MG tablet Commonly known as: ULTRAM        TAKE these medications    amLODipine  10 MG tablet Commonly known as: NORVASC  Take 1 tablet (10 mg total) by mouth daily.   Symbicort  160-4.5 MCG/ACT  inhaler Generic drug: budesonide -formoterol  Inhale 2 puffs into the lungs 2 (two) times daily.        Disposition and follow-up:   Jean Aguilar was discharged from [facility] in Stable condition.  At the hospital follow up visit please address:  1.  ***  2.  Labs / imaging needed at time of follow-up: ***  3.  Pending labs/ test needing follow-up: ***  Follow-up Appointments:  Follow-up Information     [facility], [address]. Call today.   Why: Hospital F/U in 1 week Contact information: [address] [place] [address] [phone]                  Hospital Course by problem list:  Jean Aguilar is an 88 year old woman with a history of hypertension, CKD, prior CVA, chronic back pain, vascular dementia, and polypharmacy who presented with acute altered mental status and was admitted for toxic-metabolic encephalopathy and AKI.  # Acute Toxic-Metabolic Encephalopathy (multifactorial) On admission, the patient exhibited restlessness, tremors, and waxing/waning confusion. History from family and medication review revealed recent prescriptions for gabapentin, tizanidine, and tramadol , all sedating medications likely contributing to encephalopathy, worsened by AKI and underlying dementia. CT head was negative. EEG showed mild generalized slowing, consistent with encephalopathy, without evidence of seizures.  All sedating home medications were discontinued, and delirium precautions were implemented. Mental status steadily improved during hospitalization; she became calm, interactive, and oriented to self but remained cognitively impaired consistent with baseline vascular dementia. SLP cognitive  evaluation recommended 24/7 supervision.  # Acute Kidney Injury on CKD Initial creatinine was 3.1 (baseline ~1.4 in 2016). Etiology was likely pre-renal, due to decreased intake during encephalopathy and possible urinary retention. Renal ultrasound  showed no hydronephrosis. Foley catheter initially drained ~350 mL; renal function improved with fluids and medication adjustments. Creatinine down-trended to ~2.1 at discharge. Losartan/HCTZ and other nephrotoxins were held.  # Right Hip Pain - Greater Trochanteric Tendinitis Early in hospitalization, the patient experienced severe right hip pain contributing to agitation and difficulty participating in care. X-ray showed no fracture. Pain improved significantly with acetaminophen , lidocaine  patches, conservative management, and time. No further severe pain episodes by discharge.  # Chronic Back Pain Home sedating agents (gabapentin, tizanidine, tramadol ) were stopped permanently due to encephalopathy risk and poor kidney clearance. Pain was well-controlled with scheduled acetaminophen  and topical therapies.  # Hypoglycemia The patient had mild hypoglycemia (BG 50-60) while NPO and encephalopathic. Corrected with D50 2. SLP cleared her for a dysphagia diet. No further hypoglycemia after resuming PO intake.  # Hypertension Blood pressures fluctuated but improved with medication adjustments. Hydralazine  was discontinued, losartan/HCTZ avoided in the setting of AKI, and amlodipine  10 mg daily continued.  # Chronic Memory Impairment / Vascular Dementia Baseline memory impairment was confirmed with family. During recovery, she remained oriented only to self with limited insight. SLP cognitive evaluation showed severe deficits in memory, problem-solving, initiation, and safety awareness, recommending 24-hour supervision.  # Normocytic Anemia Hemoglobin ranged 8-9 g/dL, consistent with chronic disease. B12 normal. No evidence of bleeding. Outpatient trending recommended.  # Chronic Lower-Extremity Edema Longstanding edema without signs of acute heart failure; diuretics avoided during AKI. Managed conservatively.  Discharge Condition Improved, hemodynamically stable, mental status back to near  baseline but with persistent cognitive impairment and safety concerns.  Follow-Up Recommendations for PCP  Medication reconciliation--ensure sedating medications (gabapentin, tizanidine, tramadol ) are NOT restarted.  Monitor renal function: BMP to reassess creatinine recovery and electrolytes.  Blood pressure management, continue amlodipine ; reassess need for additional agents once renal function stabilizes.  Cognitive status & safety: Discuss long-term plan for supervision; patient requires 24/7 support per SLP.  Anemia: Repeat CBC and review iron studies if pending.     Subjective   Discharge Exam:   BP (!) 159/148 (BP Location: Left Arm)   Pulse 76   Temp 99 F (37.2 C) (Oral)   Resp 20   Ht 5' 3 (1.6 m)   Wt 83.9 kg   SpO2 100%   BMI 32.77 kg/m  Discharge exam:   Pertinent Labs, Studies, and Procedures:     Latest Ref Rng & Units 05/05/2024    4:33 AM 05/04/2024    1:29 AM 05/03/2024    4:47 AM  CBC  WBC 4.0 - 10.5 K/uL 5.5  8.7  7.5   Hemoglobin 12.0 - 15.0 g/dL 89.8  9.7  9.2   Hematocrit 36.0 - 46.0 % 31.1  29.8  30.0   Platelets 150 - 400 K/uL 259  214  173        Latest Ref Rng & Units 05/05/2024    4:33 AM 05/04/2024    1:29 AM 05/03/2024    4:47 AM  CMP  Glucose 70 - 99 mg/dL 897  880  874   BUN 8 - 23 mg/dL 15  29  33   Creatinine 0.44 - 1.00 mg/dL 8.27  7.82  7.71   Sodium 135 - 145 mmol/L 143  142  140   Potassium 3.5 -  5.1 mmol/L 3.6  3.2  3.4   Chloride 98 - 111 mmol/L 109  107  106   CO2 22 - 32 mmol/L 25  27  23    Calcium  8.9 - 10.3 mg/dL 8.1  7.6  7.4   Total Protein 6.5 - 8.1 g/dL  5.8  5.7   Total Bilirubin 0.0 - 1.2 mg/dL  0.8  0.8   Alkaline Phos 38 - 126 U/L  71  67   AST 15 - 41 U/L  27  30   ALT 0 - 44 U/L  18  13     DG CHEST PORT 1 VIEW Result Date: 05/03/2024 EXAM: 1 VIEW(S) XRAY OF THE CHEST 05/02/2024 07:32:00 PM COMPARISON: 05/01/2024 CLINICAL HISTORY: Shortness of breath FINDINGS: LUNGS AND PLEURA: Pulmonary vascular  congestion. No focal pulmonary opacity. No pleural effusion. No pneumothorax. HEART AND MEDIASTINUM: Unchanged, enlarged cardiomediastinal silhouette. Atherosclerotic calcifications. BONES AND SOFT TISSUES: Right axillary surgical clips noted. No acute osseous abnormality. IMPRESSION: 1. Cardiomegaly and pulmonary vascular congestion. Electronically signed by: Norman Gatlin MD 05/03/2024 12:45 AM EST RP Workstation: [id number]   EEG adult Result Date: 05/02/2024 Michaela Aisha SQUIBB, MD     05/02/2024  3:55 PM History: 88 year old female being evaluated for encephalopathy, EEG to rule out EEG Duration: 23 minutes Sedation: none Patient State: Awake and drowsy Technique: This EEG was acquired with electrodes placed according to the International 10-20 electrode system (including Fp1, Fp2, F3, F4, C3, C4, P3, P4, O1, O2, T3, T4, T5, T6, A1, A2, Fz, Cz, Pz). The following electrodes were missing or displaced: none. Background: The background consists of intermixed alpha and beta activities. There is a well defined posterior dominant rhythm of 8-9 Hz that attenuates with eye opening.  There is also sleep is not recorded. Photic stimulation: Physiologic driving is not performed EEG Abnormalities: 1) mild generalized irregular slow activity Clinical Interpretation: This EEG is consistent with a mild generalized nonspecific cerebral dysfunction (encephalopathy). There was no seizure or seizure predisposition recorded on this study. Please note that lack of epileptiform activity on EEG does not preclude the possibility of epilepsy. Aisha Michaela, MD [facility] If 7pm- 7am, please page neurology on call as listed in AMION.  DG HIP UNILAT WITH PELVIS 2-3 VIEWS RIGHT Result Date: 05/02/2024 EXAM: 2 OR MORE VIEW(S) XRAY OF THE UNILATERAL HIP 05/02/2024 01:27:00 AM COMPARISON: 4 / 11 / 23. CLINICAL HISTORY: Hip pain FINDINGS: BONES AND JOINTS: No acute fracture or focal osseous lesion. The hip joint  is maintained. Mild degenerative changes of the hips. LUMBAR SPINE: Degenerative changes of the lower lumbar spine. SOFT TISSUES: Vascular calcifications. IMPRESSION: 1. Mild degenerative changes of the hips. Electronically signed by: Norman Gatlin MD 05/02/2024 01:31 AM EST RP Workstation: [id number]   US  RENAL Result Date: 05/01/2024 CLINICAL DATA:  Acute kidney injury. EXAM: RENAL / URINARY TRACT ULTRASOUND COMPLETE COMPARISON:  CT, 12/16/2014.  Ultrasound, 06/13/2017. FINDINGS: Right Kidney: Renal measurements: 9.0 x 4.0 x 5.0 cm = volume: 93.9 mL. Increased parenchymal echogenicity. Right renal cortical thinning. Small exophytic cyst, midpole, 9 x 8 x 8 mm. No other masses, no stones and no hydronephrosis. Left Kidney: Renal measurements: 12.3 x 6.4 x 5.1 cm = volume: 210.4 mL. Increased parenchymal echogenicity. Upper pole cyst measuring 4.2 x 3.4 x 3.7 cm. Small exophytic upper pole cyst measuring 9 x 7 x 8 mm. No other masses, no stones and no hydronephrosis. Bladder: Appears normal for degree of bladder distention. Other: None. IMPRESSION: 1. No acute  findings. 2. Increased renal parenchymal echogenicity suggests medical renal disease. 3. Right renal cortical thinning and mild reduction in the right renal size compared to the left. 4. Renal cysts. Electronically Signed   By: [name] Parkins M.D.   On: 05/01/2024 17:50   DG Chest Port 1 View Result Date: 05/01/2024 CLINICAL DATA:  Hypoxia EXAM: PORTABLE CHEST 1 VIEW COMPARISON:  Chest x-ray 12/16/2014 FINDINGS: Generator overlies the left chest. The heart is enlarged. There are atherosclerotic calcifications of the aorta. There is nodular prominence in the upper right paratracheal region which is new from prior. The lungs are otherwise clear. There is no pleural effusion or pneumothorax. Surgical clips overlie the right axilla. No acute fractures are seen. IMPRESSION: 1. Nodular prominence in the upper right paratracheal region is new from prior. This  may be due to vascular structures, however adenopathy or mass is not excluded. Recommend further evaluation with CT of the chest. 2. Cardiomegaly. Electronically Signed   By: Greig Pique M.D.   On: 05/01/2024 16:27   CT Head Wo Contrast Result Date: 05/01/2024 CLINICAL DATA:  Mental status change of unknown cause. EXAM: CT HEAD WITHOUT CONTRAST TECHNIQUE: Contiguous axial images were obtained from the base of the skull through the vertex without intravenous contrast. RADIATION DOSE REDUCTION: This exam was performed according to the departmental dose-optimization program which includes automated exposure control, adjustment of the mA and/or kV according to patient size and/or use of iterative reconstruction technique. COMPARISON:  12/12/2014. FINDINGS: Brain: No evidence of acute infarction, hemorrhage, hydrocephalus, extra-axial collection or mass lesion/mass effect. There is age appropriate volume loss and mild periventricular white matter hypoattenuation consistent with chronic microvascular ischemic change. Vascular: No hyperdense vessel or unexpected calcification. Skull: Normal. Negative for fracture or focal lesion. Sinuses/Orbits: Globes and orbits are unremarkable. Visualized sinuses are clear. Other: None. IMPRESSION: 1. No acute intracranial abnormalities. Electronically Signed   By: [name] Parkins M.D.   On: 05/01/2024 14:24     Discharge Instructions: Discharge Instructions     Call MD for:  difficulty breathing, headache or visual disturbances   Complete by: As directed    Call MD for:  persistant dizziness or light-headedness   Complete by: As directed    Call MD for:  persistant nausea and vomiting   Complete by: As directed    Call MD for:  severe uncontrolled pain   Complete by: As directed    Diet - low sodium heart healthy   Complete by: As directed    Discharge instructions   Complete by: As directed    Medications  Continue: - Amlodipine  10 mg once daily for blood  pressure. - Acetaminophen  (Tylenol ) as needed for pain (do not exceed 3,000 mg in 24 hours).  Stop and do not restart the following medications: - Tramadol  - Gabapentin - Tizanidine These medications contributed to confusion and are not safe for you to take. - All other previous home medications are discontinued unless specifically listed above.  Kidney Health Your kidney function was worsening when you came to the hospital but is improving now. Drink regularly throughout the day unless a doctor instructs you otherwise. Avoid NSAIDs (ibuprofen , naproxen, Advil , Motrin , Aleve).  Pain Management Use Tylenol  only for pain. Avoid sedating medications or muscle relaxants.  Diet Continue the dysphagia diet recommended by Speech Therapy. Sit upright while eating and drinking.  Activity & Safety Move slowly and use assistance when walking. Because of memory and safety concerns, you require 24/7 supervision at home.  Follow-Up  Please follow up with your primary care provider in 1 week to: Recheck kidney function and electrolytes Review blood pressure Reassess medications Review your cognitive and functional status   Increase activity slowly   Complete by: As directed        Signed: Bernadine Manos, MD 05/05/2024, 5:09 PM

## 2024-05-05 NOTE — TOC CM/SW Note (Signed)
 Please be advised this patient will require a short-term nursing home stay--anticipated 30 days or less for rehabilitation and strengthening. The plan is for return home.
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
## 2024-05-05 NOTE — Discharge Instructions (Addendum)
 Medications  Continue: - Amlodipine 10 mg once daily for blood pressure. - Acetaminophen  (Tylenol ) as needed for pain (do not exceed 3,000 mg in 24 hours).  Stop and do not restart the following medications: - Tramadol  - Gabapentin - Tizanidine These medications contributed to confusion and are not safe for you to take. - All other previous home medications are discontinued unless specifically listed above.  Kidney Health Your kidney function was worsening when you came to the hospital but is improving now. Drink regularly throughout the day unless a doctor instructs you otherwise. Avoid NSAIDs (ibuprofen , naproxen, Advil , Motrin , Aleve).  Pain Management Use Tylenol  only for pain. Avoid sedating medications or muscle relaxants.  Diet Continue the dysphagia diet recommended by Speech Therapy. Sit upright while eating and drinking.  Activity & Safety Move slowly and use assistance when walking. Because of memory and safety concerns, you require 24/7 supervision at home.  Follow-Up Please follow up with your primary care provider in 1 week to: Recheck kidney function and electrolytes Review blood pressure Reassess medications Review your cognitive and functional status

## 2024-05-07 LAB — CULTURE, BLOOD (ROUTINE X 2)
Culture: NO GROWTH
Culture: NO GROWTH
Special Requests: ADEQUATE
Special Requests: ADEQUATE
